# Patient Record
Sex: Male | Born: 1970 | Race: White | Hispanic: No | Marital: Married | Smoking: Former smoker
Health system: Southern US, Community
[De-identification: ages and names within clinical notes are randomized; demographics above are authoritative.]

## PROBLEM LIST (undated history)

## (undated) DIAGNOSIS — K219 Gastro-esophageal reflux disease without esophagitis: Secondary | ICD-10-CM

## (undated) DIAGNOSIS — N200 Calculus of kidney: Secondary | ICD-10-CM

## (undated) HISTORY — DX: Gastro-esophageal reflux disease without esophagitis: K21.9

## (undated) HISTORY — DX: Calculus of kidney: N20.0

## (undated) MED FILL — Bevacizumab-adcd IV Soln 100 MG/4ML (For Infusion): INTRAVENOUS | Qty: 24 | Status: AC

## (undated) MED FILL — Irinotecan HCl Inj 40 MG/2ML (20 MG/ML): INTRAVENOUS | Qty: 23.1 | Status: AC

## (undated) MED FILL — Leucovorin Calcium For Inj 350 MG: INTRAMUSCULAR | Qty: 51 | Status: AC

---

## 2016-01-14 DIAGNOSIS — I10 Essential (primary) hypertension: Secondary | ICD-10-CM | POA: Diagnosis not present

## 2016-01-14 DIAGNOSIS — K219 Gastro-esophageal reflux disease without esophagitis: Secondary | ICD-10-CM | POA: Diagnosis not present

## 2016-01-14 DIAGNOSIS — K7689 Other specified diseases of liver: Secondary | ICD-10-CM | POA: Diagnosis not present

## 2016-01-14 DIAGNOSIS — R109 Unspecified abdominal pain: Secondary | ICD-10-CM | POA: Diagnosis not present

## 2016-01-14 DIAGNOSIS — Z79899 Other long term (current) drug therapy: Secondary | ICD-10-CM | POA: Diagnosis not present

## 2016-01-14 DIAGNOSIS — K5720 Diverticulitis of large intestine with perforation and abscess without bleeding: Secondary | ICD-10-CM | POA: Diagnosis not present

## 2016-01-14 DIAGNOSIS — K6389 Other specified diseases of intestine: Secondary | ICD-10-CM | POA: Diagnosis not present

## 2016-01-17 DIAGNOSIS — K7689 Other specified diseases of liver: Secondary | ICD-10-CM | POA: Diagnosis not present

## 2016-01-24 DIAGNOSIS — K5732 Diverticulitis of large intestine without perforation or abscess without bleeding: Secondary | ICD-10-CM | POA: Diagnosis not present

## 2016-02-02 DIAGNOSIS — D500 Iron deficiency anemia secondary to blood loss (chronic): Secondary | ICD-10-CM | POA: Insufficient documentation

## 2016-02-02 DIAGNOSIS — C189 Malignant neoplasm of colon, unspecified: Secondary | ICD-10-CM | POA: Insufficient documentation

## 2016-02-29 DIAGNOSIS — K5732 Diverticulitis of large intestine without perforation or abscess without bleeding: Secondary | ICD-10-CM | POA: Diagnosis not present

## 2016-03-08 HISTORY — PX: SIGMOIDECTOMY: SHX176

## 2016-03-16 DIAGNOSIS — K219 Gastro-esophageal reflux disease without esophagitis: Secondary | ICD-10-CM | POA: Diagnosis not present

## 2016-03-16 DIAGNOSIS — K5732 Diverticulitis of large intestine without perforation or abscess without bleeding: Secondary | ICD-10-CM | POA: Diagnosis not present

## 2016-03-16 DIAGNOSIS — D125 Benign neoplasm of sigmoid colon: Secondary | ICD-10-CM | POA: Diagnosis not present

## 2016-03-16 DIAGNOSIS — C187 Malignant neoplasm of sigmoid colon: Secondary | ICD-10-CM | POA: Diagnosis not present

## 2016-03-20 DIAGNOSIS — K6389 Other specified diseases of intestine: Secondary | ICD-10-CM | POA: Diagnosis not present

## 2016-03-20 DIAGNOSIS — K5730 Diverticulosis of large intestine without perforation or abscess without bleeding: Secondary | ICD-10-CM | POA: Diagnosis not present

## 2016-03-21 DIAGNOSIS — K5732 Diverticulitis of large intestine without perforation or abscess without bleeding: Secondary | ICD-10-CM | POA: Diagnosis not present

## 2016-03-21 DIAGNOSIS — K639 Disease of intestine, unspecified: Secondary | ICD-10-CM | POA: Diagnosis not present

## 2016-04-05 ENCOUNTER — Encounter: Payer: Self-pay | Admitting: Oncology

## 2016-04-05 DIAGNOSIS — K219 Gastro-esophageal reflux disease without esophagitis: Secondary | ICD-10-CM | POA: Diagnosis not present

## 2016-04-05 DIAGNOSIS — K5720 Diverticulitis of large intestine with perforation and abscess without bleeding: Secondary | ICD-10-CM | POA: Diagnosis not present

## 2016-04-05 DIAGNOSIS — R339 Retention of urine, unspecified: Secondary | ICD-10-CM | POA: Diagnosis not present

## 2016-04-05 DIAGNOSIS — K639 Disease of intestine, unspecified: Secondary | ICD-10-CM | POA: Diagnosis not present

## 2016-04-05 DIAGNOSIS — Z79899 Other long term (current) drug therapy: Secondary | ICD-10-CM | POA: Diagnosis not present

## 2016-04-05 DIAGNOSIS — C772 Secondary and unspecified malignant neoplasm of intra-abdominal lymph nodes: Secondary | ICD-10-CM | POA: Diagnosis not present

## 2016-04-05 DIAGNOSIS — C187 Malignant neoplasm of sigmoid colon: Secondary | ICD-10-CM | POA: Diagnosis not present

## 2016-04-05 DIAGNOSIS — K5732 Diverticulitis of large intestine without perforation or abscess without bleeding: Secondary | ICD-10-CM | POA: Diagnosis not present

## 2016-04-05 DIAGNOSIS — Z87891 Personal history of nicotine dependence: Secondary | ICD-10-CM | POA: Diagnosis not present

## 2016-05-04 DIAGNOSIS — D649 Anemia, unspecified: Secondary | ICD-10-CM | POA: Diagnosis not present

## 2016-05-04 DIAGNOSIS — C187 Malignant neoplasm of sigmoid colon: Secondary | ICD-10-CM | POA: Diagnosis not present

## 2016-05-09 DIAGNOSIS — K7689 Other specified diseases of liver: Secondary | ICD-10-CM | POA: Diagnosis not present

## 2016-05-09 DIAGNOSIS — C187 Malignant neoplasm of sigmoid colon: Secondary | ICD-10-CM | POA: Diagnosis not present

## 2016-05-10 DIAGNOSIS — Z452 Encounter for adjustment and management of vascular access device: Secondary | ICD-10-CM | POA: Diagnosis not present

## 2016-05-10 DIAGNOSIS — I10 Essential (primary) hypertension: Secondary | ICD-10-CM | POA: Diagnosis not present

## 2016-05-10 DIAGNOSIS — Z79899 Other long term (current) drug therapy: Secondary | ICD-10-CM | POA: Diagnosis not present

## 2016-05-10 DIAGNOSIS — C187 Malignant neoplasm of sigmoid colon: Secondary | ICD-10-CM | POA: Diagnosis not present

## 2016-05-10 DIAGNOSIS — K219 Gastro-esophageal reflux disease without esophagitis: Secondary | ICD-10-CM | POA: Diagnosis not present

## 2016-05-12 DIAGNOSIS — C187 Malignant neoplasm of sigmoid colon: Secondary | ICD-10-CM | POA: Diagnosis not present

## 2016-05-17 DIAGNOSIS — C187 Malignant neoplasm of sigmoid colon: Secondary | ICD-10-CM | POA: Diagnosis not present

## 2016-05-17 DIAGNOSIS — Z5111 Encounter for antineoplastic chemotherapy: Secondary | ICD-10-CM | POA: Diagnosis not present

## 2016-05-19 DIAGNOSIS — Z452 Encounter for adjustment and management of vascular access device: Secondary | ICD-10-CM | POA: Diagnosis not present

## 2016-05-19 DIAGNOSIS — C187 Malignant neoplasm of sigmoid colon: Secondary | ICD-10-CM | POA: Diagnosis not present

## 2016-05-26 DIAGNOSIS — C187 Malignant neoplasm of sigmoid colon: Secondary | ICD-10-CM | POA: Diagnosis not present

## 2016-05-31 DIAGNOSIS — C187 Malignant neoplasm of sigmoid colon: Secondary | ICD-10-CM | POA: Diagnosis not present

## 2016-06-02 DIAGNOSIS — C187 Malignant neoplasm of sigmoid colon: Secondary | ICD-10-CM | POA: Diagnosis not present

## 2016-06-02 DIAGNOSIS — Z452 Encounter for adjustment and management of vascular access device: Secondary | ICD-10-CM | POA: Diagnosis not present

## 2016-06-14 DIAGNOSIS — C187 Malignant neoplasm of sigmoid colon: Secondary | ICD-10-CM | POA: Diagnosis not present

## 2016-06-16 DIAGNOSIS — C187 Malignant neoplasm of sigmoid colon: Secondary | ICD-10-CM | POA: Diagnosis not present

## 2016-06-16 DIAGNOSIS — Z452 Encounter for adjustment and management of vascular access device: Secondary | ICD-10-CM | POA: Diagnosis not present

## 2016-06-17 DIAGNOSIS — C187 Malignant neoplasm of sigmoid colon: Secondary | ICD-10-CM | POA: Diagnosis not present

## 2016-06-28 DIAGNOSIS — C187 Malignant neoplasm of sigmoid colon: Secondary | ICD-10-CM | POA: Diagnosis not present

## 2016-06-30 DIAGNOSIS — Z452 Encounter for adjustment and management of vascular access device: Secondary | ICD-10-CM | POA: Diagnosis not present

## 2016-06-30 DIAGNOSIS — C187 Malignant neoplasm of sigmoid colon: Secondary | ICD-10-CM | POA: Diagnosis not present

## 2016-07-12 DIAGNOSIS — Z800 Family history of malignant neoplasm of digestive organs: Secondary | ICD-10-CM | POA: Diagnosis not present

## 2016-07-12 DIAGNOSIS — Z808 Family history of malignant neoplasm of other organs or systems: Secondary | ICD-10-CM | POA: Diagnosis not present

## 2016-07-12 DIAGNOSIS — Z806 Family history of leukemia: Secondary | ICD-10-CM | POA: Diagnosis not present

## 2016-07-12 DIAGNOSIS — C187 Malignant neoplasm of sigmoid colon: Secondary | ICD-10-CM | POA: Diagnosis not present

## 2016-07-12 DIAGNOSIS — C189 Malignant neoplasm of colon, unspecified: Secondary | ICD-10-CM | POA: Diagnosis not present

## 2016-07-14 DIAGNOSIS — C187 Malignant neoplasm of sigmoid colon: Secondary | ICD-10-CM | POA: Diagnosis not present

## 2016-07-14 DIAGNOSIS — Z452 Encounter for adjustment and management of vascular access device: Secondary | ICD-10-CM | POA: Diagnosis not present

## 2016-07-15 DIAGNOSIS — C187 Malignant neoplasm of sigmoid colon: Secondary | ICD-10-CM | POA: Diagnosis not present

## 2016-07-26 DIAGNOSIS — K5900 Constipation, unspecified: Secondary | ICD-10-CM | POA: Diagnosis not present

## 2016-07-26 DIAGNOSIS — C187 Malignant neoplasm of sigmoid colon: Secondary | ICD-10-CM | POA: Diagnosis not present

## 2016-07-26 DIAGNOSIS — D509 Iron deficiency anemia, unspecified: Secondary | ICD-10-CM | POA: Diagnosis not present

## 2016-07-28 DIAGNOSIS — Z452 Encounter for adjustment and management of vascular access device: Secondary | ICD-10-CM | POA: Diagnosis not present

## 2016-07-28 DIAGNOSIS — C187 Malignant neoplasm of sigmoid colon: Secondary | ICD-10-CM | POA: Diagnosis not present

## 2016-08-09 DIAGNOSIS — Z800 Family history of malignant neoplasm of digestive organs: Secondary | ICD-10-CM | POA: Diagnosis not present

## 2016-08-09 DIAGNOSIS — K5900 Constipation, unspecified: Secondary | ICD-10-CM | POA: Diagnosis not present

## 2016-08-09 DIAGNOSIS — K769 Liver disease, unspecified: Secondary | ICD-10-CM | POA: Diagnosis not present

## 2016-08-09 DIAGNOSIS — D509 Iron deficiency anemia, unspecified: Secondary | ICD-10-CM | POA: Diagnosis not present

## 2016-08-09 DIAGNOSIS — C779 Secondary and unspecified malignant neoplasm of lymph node, unspecified: Secondary | ICD-10-CM | POA: Diagnosis not present

## 2016-08-09 DIAGNOSIS — Z806 Family history of leukemia: Secondary | ICD-10-CM | POA: Diagnosis not present

## 2016-08-09 DIAGNOSIS — C187 Malignant neoplasm of sigmoid colon: Secondary | ICD-10-CM | POA: Diagnosis not present

## 2016-08-09 DIAGNOSIS — Z801 Family history of malignant neoplasm of trachea, bronchus and lung: Secondary | ICD-10-CM | POA: Diagnosis not present

## 2016-08-11 DIAGNOSIS — Z452 Encounter for adjustment and management of vascular access device: Secondary | ICD-10-CM | POA: Diagnosis not present

## 2016-08-11 DIAGNOSIS — C187 Malignant neoplasm of sigmoid colon: Secondary | ICD-10-CM | POA: Diagnosis not present

## 2016-08-15 DIAGNOSIS — C187 Malignant neoplasm of sigmoid colon: Secondary | ICD-10-CM | POA: Diagnosis not present

## 2016-08-23 DIAGNOSIS — C187 Malignant neoplasm of sigmoid colon: Secondary | ICD-10-CM | POA: Diagnosis not present

## 2016-08-25 DIAGNOSIS — Z452 Encounter for adjustment and management of vascular access device: Secondary | ICD-10-CM | POA: Diagnosis not present

## 2016-08-25 DIAGNOSIS — D509 Iron deficiency anemia, unspecified: Secondary | ICD-10-CM | POA: Diagnosis not present

## 2016-08-25 DIAGNOSIS — C187 Malignant neoplasm of sigmoid colon: Secondary | ICD-10-CM | POA: Diagnosis not present

## 2016-09-06 DIAGNOSIS — Z5111 Encounter for antineoplastic chemotherapy: Secondary | ICD-10-CM | POA: Diagnosis not present

## 2016-09-06 DIAGNOSIS — C187 Malignant neoplasm of sigmoid colon: Secondary | ICD-10-CM | POA: Diagnosis not present

## 2016-09-08 DIAGNOSIS — C187 Malignant neoplasm of sigmoid colon: Secondary | ICD-10-CM | POA: Diagnosis not present

## 2016-09-08 DIAGNOSIS — Z452 Encounter for adjustment and management of vascular access device: Secondary | ICD-10-CM | POA: Diagnosis not present

## 2016-09-14 DIAGNOSIS — C187 Malignant neoplasm of sigmoid colon: Secondary | ICD-10-CM | POA: Diagnosis not present

## 2016-09-20 DIAGNOSIS — D649 Anemia, unspecified: Secondary | ICD-10-CM | POA: Diagnosis not present

## 2016-09-20 DIAGNOSIS — C187 Malignant neoplasm of sigmoid colon: Secondary | ICD-10-CM | POA: Diagnosis not present

## 2016-09-22 DIAGNOSIS — C187 Malignant neoplasm of sigmoid colon: Secondary | ICD-10-CM | POA: Diagnosis not present

## 2016-09-22 DIAGNOSIS — Z452 Encounter for adjustment and management of vascular access device: Secondary | ICD-10-CM | POA: Diagnosis not present

## 2016-10-04 DIAGNOSIS — C187 Malignant neoplasm of sigmoid colon: Secondary | ICD-10-CM | POA: Diagnosis not present

## 2016-10-06 DIAGNOSIS — C187 Malignant neoplasm of sigmoid colon: Secondary | ICD-10-CM | POA: Diagnosis not present

## 2016-10-06 DIAGNOSIS — Z452 Encounter for adjustment and management of vascular access device: Secondary | ICD-10-CM | POA: Diagnosis not present

## 2016-10-15 DIAGNOSIS — C187 Malignant neoplasm of sigmoid colon: Secondary | ICD-10-CM | POA: Diagnosis not present

## 2016-10-18 DIAGNOSIS — Z85038 Personal history of other malignant neoplasm of large intestine: Secondary | ICD-10-CM | POA: Diagnosis not present

## 2016-10-18 DIAGNOSIS — Z5111 Encounter for antineoplastic chemotherapy: Secondary | ICD-10-CM | POA: Diagnosis not present

## 2016-10-18 DIAGNOSIS — C187 Malignant neoplasm of sigmoid colon: Secondary | ICD-10-CM | POA: Diagnosis not present

## 2016-10-18 DIAGNOSIS — Z9221 Personal history of antineoplastic chemotherapy: Secondary | ICD-10-CM | POA: Diagnosis not present

## 2016-10-20 DIAGNOSIS — C187 Malignant neoplasm of sigmoid colon: Secondary | ICD-10-CM | POA: Diagnosis not present

## 2016-10-20 DIAGNOSIS — Z452 Encounter for adjustment and management of vascular access device: Secondary | ICD-10-CM | POA: Diagnosis not present

## 2017-01-16 DIAGNOSIS — K7689 Other specified diseases of liver: Secondary | ICD-10-CM | POA: Diagnosis not present

## 2017-01-16 DIAGNOSIS — D500 Iron deficiency anemia secondary to blood loss (chronic): Secondary | ICD-10-CM | POA: Diagnosis not present

## 2017-01-16 DIAGNOSIS — C187 Malignant neoplasm of sigmoid colon: Secondary | ICD-10-CM | POA: Diagnosis not present

## 2017-01-16 DIAGNOSIS — D649 Anemia, unspecified: Secondary | ICD-10-CM | POA: Diagnosis not present

## 2017-01-17 DIAGNOSIS — R935 Abnormal findings on diagnostic imaging of other abdominal regions, including retroperitoneum: Secondary | ICD-10-CM | POA: Diagnosis not present

## 2017-01-17 DIAGNOSIS — R188 Other ascites: Secondary | ICD-10-CM | POA: Diagnosis not present

## 2017-01-17 DIAGNOSIS — Z9221 Personal history of antineoplastic chemotherapy: Secondary | ICD-10-CM | POA: Diagnosis not present

## 2017-01-17 DIAGNOSIS — C187 Malignant neoplasm of sigmoid colon: Secondary | ICD-10-CM | POA: Diagnosis not present

## 2017-01-17 DIAGNOSIS — Z85038 Personal history of other malignant neoplasm of large intestine: Secondary | ICD-10-CM | POA: Diagnosis not present

## 2017-01-22 DIAGNOSIS — C786 Secondary malignant neoplasm of retroperitoneum and peritoneum: Secondary | ICD-10-CM | POA: Diagnosis not present

## 2017-01-22 DIAGNOSIS — R188 Other ascites: Secondary | ICD-10-CM | POA: Diagnosis not present

## 2017-01-22 DIAGNOSIS — C187 Malignant neoplasm of sigmoid colon: Secondary | ICD-10-CM | POA: Diagnosis not present

## 2017-01-23 DIAGNOSIS — R188 Other ascites: Secondary | ICD-10-CM | POA: Diagnosis not present

## 2017-01-23 DIAGNOSIS — K7689 Other specified diseases of liver: Secondary | ICD-10-CM | POA: Diagnosis not present

## 2017-01-23 DIAGNOSIS — C187 Malignant neoplasm of sigmoid colon: Secondary | ICD-10-CM | POA: Diagnosis not present

## 2017-01-23 DIAGNOSIS — K769 Liver disease, unspecified: Secondary | ICD-10-CM | POA: Diagnosis not present

## 2017-02-02 DIAGNOSIS — K668 Other specified disorders of peritoneum: Secondary | ICD-10-CM | POA: Diagnosis not present

## 2017-02-02 DIAGNOSIS — D649 Anemia, unspecified: Secondary | ICD-10-CM | POA: Diagnosis not present

## 2017-02-02 DIAGNOSIS — C187 Malignant neoplasm of sigmoid colon: Secondary | ICD-10-CM | POA: Diagnosis not present

## 2017-02-02 DIAGNOSIS — D500 Iron deficiency anemia secondary to blood loss (chronic): Secondary | ICD-10-CM | POA: Diagnosis not present

## 2017-02-02 DIAGNOSIS — K658 Other peritonitis: Secondary | ICD-10-CM | POA: Diagnosis not present

## 2017-02-07 DIAGNOSIS — C20 Malignant neoplasm of rectum: Secondary | ICD-10-CM | POA: Diagnosis not present

## 2017-02-07 DIAGNOSIS — R180 Malignant ascites: Secondary | ICD-10-CM | POA: Diagnosis not present

## 2017-02-07 DIAGNOSIS — C189 Malignant neoplasm of colon, unspecified: Secondary | ICD-10-CM | POA: Diagnosis not present

## 2017-02-07 DIAGNOSIS — Z9221 Personal history of antineoplastic chemotherapy: Secondary | ICD-10-CM | POA: Diagnosis not present

## 2017-02-07 DIAGNOSIS — C187 Malignant neoplasm of sigmoid colon: Secondary | ICD-10-CM | POA: Diagnosis not present

## 2017-02-07 DIAGNOSIS — R935 Abnormal findings on diagnostic imaging of other abdominal regions, including retroperitoneum: Secondary | ICD-10-CM | POA: Diagnosis not present

## 2017-02-07 DIAGNOSIS — Z85038 Personal history of other malignant neoplasm of large intestine: Secondary | ICD-10-CM | POA: Diagnosis not present

## 2017-02-13 DIAGNOSIS — C187 Malignant neoplasm of sigmoid colon: Secondary | ICD-10-CM | POA: Diagnosis not present

## 2017-02-13 DIAGNOSIS — R188 Other ascites: Secondary | ICD-10-CM | POA: Diagnosis not present

## 2017-02-15 DIAGNOSIS — C187 Malignant neoplasm of sigmoid colon: Secondary | ICD-10-CM | POA: Diagnosis not present

## 2017-02-15 DIAGNOSIS — Z79899 Other long term (current) drug therapy: Secondary | ICD-10-CM | POA: Diagnosis not present

## 2017-02-15 DIAGNOSIS — Z9049 Acquired absence of other specified parts of digestive tract: Secondary | ICD-10-CM | POA: Diagnosis not present

## 2017-02-15 DIAGNOSIS — K219 Gastro-esophageal reflux disease without esophagitis: Secondary | ICD-10-CM | POA: Diagnosis not present

## 2017-02-21 DIAGNOSIS — R188 Other ascites: Secondary | ICD-10-CM | POA: Diagnosis not present

## 2017-02-21 DIAGNOSIS — Z79891 Long term (current) use of opiate analgesic: Secondary | ICD-10-CM | POA: Diagnosis not present

## 2017-02-21 DIAGNOSIS — K654 Sclerosing mesenteritis: Secondary | ICD-10-CM | POA: Diagnosis not present

## 2017-02-21 DIAGNOSIS — I10 Essential (primary) hypertension: Secondary | ICD-10-CM | POA: Diagnosis not present

## 2017-02-21 DIAGNOSIS — Z79899 Other long term (current) drug therapy: Secondary | ICD-10-CM | POA: Diagnosis not present

## 2017-02-21 DIAGNOSIS — K6389 Other specified diseases of intestine: Secondary | ICD-10-CM | POA: Diagnosis not present

## 2017-02-21 DIAGNOSIS — Z85038 Personal history of other malignant neoplasm of large intestine: Secondary | ICD-10-CM | POA: Diagnosis not present

## 2017-03-14 DIAGNOSIS — K810 Acute cholecystitis: Secondary | ICD-10-CM | POA: Diagnosis not present

## 2017-03-14 DIAGNOSIS — Z85038 Personal history of other malignant neoplasm of large intestine: Secondary | ICD-10-CM | POA: Diagnosis not present

## 2017-03-14 DIAGNOSIS — K8000 Calculus of gallbladder with acute cholecystitis without obstruction: Secondary | ICD-10-CM | POA: Diagnosis not present

## 2017-03-14 DIAGNOSIS — K8020 Calculus of gallbladder without cholecystitis without obstruction: Secondary | ICD-10-CM | POA: Diagnosis not present

## 2017-03-14 DIAGNOSIS — Z79899 Other long term (current) drug therapy: Secondary | ICD-10-CM | POA: Diagnosis not present

## 2017-03-14 DIAGNOSIS — C19 Malignant neoplasm of rectosigmoid junction: Secondary | ICD-10-CM | POA: Diagnosis not present

## 2017-03-14 DIAGNOSIS — R109 Unspecified abdominal pain: Secondary | ICD-10-CM | POA: Diagnosis not present

## 2017-03-14 DIAGNOSIS — K219 Gastro-esophageal reflux disease without esophagitis: Secondary | ICD-10-CM | POA: Diagnosis not present

## 2017-03-14 DIAGNOSIS — I10 Essential (primary) hypertension: Secondary | ICD-10-CM | POA: Diagnosis not present

## 2017-03-15 DIAGNOSIS — K810 Acute cholecystitis: Secondary | ICD-10-CM | POA: Diagnosis not present

## 2017-03-15 DIAGNOSIS — K8000 Calculus of gallbladder with acute cholecystitis without obstruction: Secondary | ICD-10-CM | POA: Diagnosis not present

## 2017-03-15 DIAGNOSIS — K8020 Calculus of gallbladder without cholecystitis without obstruction: Secondary | ICD-10-CM | POA: Diagnosis not present

## 2017-03-27 DIAGNOSIS — Z23 Encounter for immunization: Secondary | ICD-10-CM | POA: Diagnosis not present

## 2017-03-27 DIAGNOSIS — Z1339 Encounter for screening examination for other mental health and behavioral disorders: Secondary | ICD-10-CM | POA: Diagnosis not present

## 2017-03-27 DIAGNOSIS — K219 Gastro-esophageal reflux disease without esophagitis: Secondary | ICD-10-CM | POA: Diagnosis not present

## 2017-03-27 DIAGNOSIS — Z1331 Encounter for screening for depression: Secondary | ICD-10-CM | POA: Diagnosis not present

## 2017-03-27 DIAGNOSIS — Z6826 Body mass index (BMI) 26.0-26.9, adult: Secondary | ICD-10-CM | POA: Diagnosis not present

## 2017-04-27 DIAGNOSIS — R188 Other ascites: Secondary | ICD-10-CM | POA: Diagnosis not present

## 2017-04-27 DIAGNOSIS — C187 Malignant neoplasm of sigmoid colon: Secondary | ICD-10-CM | POA: Diagnosis not present

## 2017-05-03 DIAGNOSIS — L709 Acne, unspecified: Secondary | ICD-10-CM | POA: Diagnosis not present

## 2017-05-03 DIAGNOSIS — K219 Gastro-esophageal reflux disease without esophagitis: Secondary | ICD-10-CM | POA: Diagnosis not present

## 2017-05-03 DIAGNOSIS — Z6827 Body mass index (BMI) 27.0-27.9, adult: Secondary | ICD-10-CM | POA: Diagnosis not present

## 2017-05-03 DIAGNOSIS — R030 Elevated blood-pressure reading, without diagnosis of hypertension: Secondary | ICD-10-CM | POA: Diagnosis not present

## 2017-05-25 DIAGNOSIS — Z85038 Personal history of other malignant neoplasm of large intestine: Secondary | ICD-10-CM | POA: Diagnosis not present

## 2017-05-25 DIAGNOSIS — K769 Liver disease, unspecified: Secondary | ICD-10-CM | POA: Diagnosis not present

## 2017-08-02 DIAGNOSIS — Z6828 Body mass index (BMI) 28.0-28.9, adult: Secondary | ICD-10-CM | POA: Diagnosis not present

## 2017-08-02 DIAGNOSIS — J111 Influenza due to unidentified influenza virus with other respiratory manifestations: Secondary | ICD-10-CM | POA: Diagnosis not present

## 2017-08-20 DIAGNOSIS — Z6827 Body mass index (BMI) 27.0-27.9, adult: Secondary | ICD-10-CM | POA: Diagnosis not present

## 2017-08-20 DIAGNOSIS — K5900 Constipation, unspecified: Secondary | ICD-10-CM | POA: Diagnosis not present

## 2017-08-20 DIAGNOSIS — R55 Syncope and collapse: Secondary | ICD-10-CM | POA: Diagnosis not present

## 2017-08-20 DIAGNOSIS — R3129 Other microscopic hematuria: Secondary | ICD-10-CM | POA: Diagnosis not present

## 2017-08-23 DIAGNOSIS — C187 Malignant neoplasm of sigmoid colon: Secondary | ICD-10-CM | POA: Diagnosis not present

## 2017-08-23 DIAGNOSIS — R945 Abnormal results of liver function studies: Secondary | ICD-10-CM | POA: Diagnosis not present

## 2017-08-23 DIAGNOSIS — D649 Anemia, unspecified: Secondary | ICD-10-CM | POA: Diagnosis not present

## 2017-08-23 DIAGNOSIS — K769 Liver disease, unspecified: Secondary | ICD-10-CM | POA: Diagnosis not present

## 2017-08-23 DIAGNOSIS — Z85038 Personal history of other malignant neoplasm of large intestine: Secondary | ICD-10-CM | POA: Diagnosis not present

## 2017-08-23 DIAGNOSIS — Z9221 Personal history of antineoplastic chemotherapy: Secondary | ICD-10-CM | POA: Diagnosis not present

## 2017-08-23 DIAGNOSIS — R748 Abnormal levels of other serum enzymes: Secondary | ICD-10-CM | POA: Diagnosis not present

## 2017-08-23 DIAGNOSIS — Z79899 Other long term (current) drug therapy: Secondary | ICD-10-CM | POA: Diagnosis not present

## 2017-08-31 DIAGNOSIS — K769 Liver disease, unspecified: Secondary | ICD-10-CM | POA: Diagnosis not present

## 2017-08-31 DIAGNOSIS — Z85038 Personal history of other malignant neoplasm of large intestine: Secondary | ICD-10-CM | POA: Diagnosis not present

## 2017-09-21 DIAGNOSIS — C187 Malignant neoplasm of sigmoid colon: Secondary | ICD-10-CM | POA: Diagnosis not present

## 2017-11-02 DIAGNOSIS — C187 Malignant neoplasm of sigmoid colon: Secondary | ICD-10-CM | POA: Diagnosis not present

## 2017-11-02 DIAGNOSIS — N281 Cyst of kidney, acquired: Secondary | ICD-10-CM | POA: Diagnosis not present

## 2017-11-06 DIAGNOSIS — Z9221 Personal history of antineoplastic chemotherapy: Secondary | ICD-10-CM | POA: Diagnosis not present

## 2017-11-06 DIAGNOSIS — Z85038 Personal history of other malignant neoplasm of large intestine: Secondary | ICD-10-CM | POA: Diagnosis not present

## 2018-03-14 DIAGNOSIS — K769 Liver disease, unspecified: Secondary | ICD-10-CM | POA: Diagnosis not present

## 2018-03-14 DIAGNOSIS — Z85038 Personal history of other malignant neoplasm of large intestine: Secondary | ICD-10-CM | POA: Diagnosis not present

## 2018-03-25 DIAGNOSIS — Z452 Encounter for adjustment and management of vascular access device: Secondary | ICD-10-CM | POA: Diagnosis not present

## 2018-04-03 DIAGNOSIS — Z85038 Personal history of other malignant neoplasm of large intestine: Secondary | ICD-10-CM | POA: Diagnosis not present

## 2018-04-03 DIAGNOSIS — Z452 Encounter for adjustment and management of vascular access device: Secondary | ICD-10-CM | POA: Diagnosis not present

## 2018-04-03 DIAGNOSIS — K219 Gastro-esophageal reflux disease without esophagitis: Secondary | ICD-10-CM | POA: Diagnosis not present

## 2018-04-03 DIAGNOSIS — Z79899 Other long term (current) drug therapy: Secondary | ICD-10-CM | POA: Diagnosis not present

## 2018-04-03 DIAGNOSIS — Z980 Intestinal bypass and anastomosis status: Secondary | ICD-10-CM | POA: Diagnosis not present

## 2018-04-03 DIAGNOSIS — Z87891 Personal history of nicotine dependence: Secondary | ICD-10-CM | POA: Diagnosis not present

## 2018-05-03 DIAGNOSIS — C187 Malignant neoplasm of sigmoid colon: Secondary | ICD-10-CM | POA: Diagnosis not present

## 2018-05-03 DIAGNOSIS — N2889 Other specified disorders of kidney and ureter: Secondary | ICD-10-CM | POA: Diagnosis not present

## 2019-02-20 DIAGNOSIS — Z85038 Personal history of other malignant neoplasm of large intestine: Secondary | ICD-10-CM | POA: Diagnosis not present

## 2019-02-20 DIAGNOSIS — R180 Malignant ascites: Secondary | ICD-10-CM | POA: Diagnosis not present

## 2019-02-20 DIAGNOSIS — D500 Iron deficiency anemia secondary to blood loss (chronic): Secondary | ICD-10-CM | POA: Diagnosis not present

## 2019-02-20 DIAGNOSIS — C187 Malignant neoplasm of sigmoid colon: Secondary | ICD-10-CM | POA: Diagnosis not present

## 2019-02-20 DIAGNOSIS — K769 Liver disease, unspecified: Secondary | ICD-10-CM | POA: Diagnosis not present

## 2019-02-21 DIAGNOSIS — Z9221 Personal history of antineoplastic chemotherapy: Secondary | ICD-10-CM | POA: Diagnosis not present

## 2019-02-21 DIAGNOSIS — C187 Malignant neoplasm of sigmoid colon: Secondary | ICD-10-CM | POA: Diagnosis not present

## 2019-02-21 DIAGNOSIS — Z85038 Personal history of other malignant neoplasm of large intestine: Secondary | ICD-10-CM | POA: Diagnosis not present

## 2019-02-21 DIAGNOSIS — R180 Malignant ascites: Secondary | ICD-10-CM | POA: Diagnosis not present

## 2019-05-19 DIAGNOSIS — C187 Malignant neoplasm of sigmoid colon: Secondary | ICD-10-CM | POA: Diagnosis not present

## 2019-05-19 DIAGNOSIS — D649 Anemia, unspecified: Secondary | ICD-10-CM | POA: Diagnosis not present

## 2019-05-19 DIAGNOSIS — K7689 Other specified diseases of liver: Secondary | ICD-10-CM | POA: Diagnosis not present

## 2019-05-29 DIAGNOSIS — C787 Secondary malignant neoplasm of liver and intrahepatic bile duct: Secondary | ICD-10-CM | POA: Insufficient documentation

## 2019-05-29 DIAGNOSIS — R180 Malignant ascites: Secondary | ICD-10-CM | POA: Diagnosis not present

## 2019-05-29 DIAGNOSIS — D649 Anemia, unspecified: Secondary | ICD-10-CM | POA: Diagnosis not present

## 2019-05-29 DIAGNOSIS — C187 Malignant neoplasm of sigmoid colon: Secondary | ICD-10-CM

## 2019-05-29 DIAGNOSIS — D500 Iron deficiency anemia secondary to blood loss (chronic): Secondary | ICD-10-CM | POA: Diagnosis not present

## 2019-05-30 DIAGNOSIS — R933 Abnormal findings on diagnostic imaging of other parts of digestive tract: Secondary | ICD-10-CM | POA: Diagnosis not present

## 2019-05-30 DIAGNOSIS — C187 Malignant neoplasm of sigmoid colon: Secondary | ICD-10-CM | POA: Diagnosis not present

## 2019-05-30 DIAGNOSIS — C189 Malignant neoplasm of colon, unspecified: Secondary | ICD-10-CM | POA: Diagnosis not present

## 2019-06-04 DIAGNOSIS — Z20828 Contact with and (suspected) exposure to other viral communicable diseases: Secondary | ICD-10-CM | POA: Diagnosis not present

## 2019-06-04 DIAGNOSIS — Z01818 Encounter for other preprocedural examination: Secondary | ICD-10-CM | POA: Diagnosis not present

## 2019-06-10 DIAGNOSIS — R935 Abnormal findings on diagnostic imaging of other abdominal regions, including retroperitoneum: Secondary | ICD-10-CM | POA: Diagnosis not present

## 2019-06-10 DIAGNOSIS — C229 Malignant neoplasm of liver, not specified as primary or secondary: Secondary | ICD-10-CM | POA: Diagnosis not present

## 2019-06-10 DIAGNOSIS — K7689 Other specified diseases of liver: Secondary | ICD-10-CM | POA: Diagnosis not present

## 2019-06-10 DIAGNOSIS — C227 Other specified carcinomas of liver: Secondary | ICD-10-CM | POA: Diagnosis not present

## 2019-06-10 DIAGNOSIS — K7290 Hepatic failure, unspecified without coma: Secondary | ICD-10-CM | POA: Diagnosis not present

## 2019-06-10 DIAGNOSIS — C187 Malignant neoplasm of sigmoid colon: Secondary | ICD-10-CM | POA: Diagnosis not present

## 2019-06-11 DIAGNOSIS — K769 Liver disease, unspecified: Secondary | ICD-10-CM | POA: Diagnosis not present

## 2019-06-11 DIAGNOSIS — Z85038 Personal history of other malignant neoplasm of large intestine: Secondary | ICD-10-CM | POA: Diagnosis not present

## 2019-06-11 DIAGNOSIS — C187 Malignant neoplasm of sigmoid colon: Secondary | ICD-10-CM | POA: Diagnosis not present

## 2019-06-16 DIAGNOSIS — Z9049 Acquired absence of other specified parts of digestive tract: Secondary | ICD-10-CM | POA: Diagnosis not present

## 2019-06-16 DIAGNOSIS — G629 Polyneuropathy, unspecified: Secondary | ICD-10-CM | POA: Diagnosis not present

## 2019-06-16 DIAGNOSIS — Z800 Family history of malignant neoplasm of digestive organs: Secondary | ICD-10-CM | POA: Diagnosis not present

## 2019-06-16 DIAGNOSIS — C187 Malignant neoplasm of sigmoid colon: Secondary | ICD-10-CM | POA: Diagnosis not present

## 2019-06-16 DIAGNOSIS — C787 Secondary malignant neoplasm of liver and intrahepatic bile duct: Secondary | ICD-10-CM | POA: Diagnosis not present

## 2019-06-16 DIAGNOSIS — R188 Other ascites: Secondary | ICD-10-CM | POA: Diagnosis not present

## 2019-06-16 DIAGNOSIS — K769 Liver disease, unspecified: Secondary | ICD-10-CM | POA: Diagnosis not present

## 2019-06-16 DIAGNOSIS — C801 Malignant (primary) neoplasm, unspecified: Secondary | ICD-10-CM | POA: Diagnosis not present

## 2019-06-16 DIAGNOSIS — C189 Malignant neoplasm of colon, unspecified: Secondary | ICD-10-CM | POA: Diagnosis not present

## 2019-06-16 DIAGNOSIS — C229 Malignant neoplasm of liver, not specified as primary or secondary: Secondary | ICD-10-CM | POA: Diagnosis not present

## 2019-06-30 DIAGNOSIS — Z01812 Encounter for preprocedural laboratory examination: Secondary | ICD-10-CM | POA: Diagnosis not present

## 2019-07-02 DIAGNOSIS — R42 Dizziness and giddiness: Secondary | ICD-10-CM | POA: Diagnosis not present

## 2019-07-02 DIAGNOSIS — Z9049 Acquired absence of other specified parts of digestive tract: Secondary | ICD-10-CM | POA: Diagnosis not present

## 2019-07-02 DIAGNOSIS — C187 Malignant neoplasm of sigmoid colon: Secondary | ICD-10-CM | POA: Diagnosis not present

## 2019-07-02 DIAGNOSIS — I10 Essential (primary) hypertension: Secondary | ICD-10-CM | POA: Diagnosis not present

## 2019-07-02 DIAGNOSIS — R109 Unspecified abdominal pain: Secondary | ICD-10-CM | POA: Diagnosis not present

## 2019-07-02 DIAGNOSIS — G8918 Other acute postprocedural pain: Secondary | ICD-10-CM | POA: Diagnosis not present

## 2019-07-02 DIAGNOSIS — C787 Secondary malignant neoplasm of liver and intrahepatic bile duct: Secondary | ICD-10-CM | POA: Diagnosis not present

## 2019-07-02 DIAGNOSIS — C189 Malignant neoplasm of colon, unspecified: Secondary | ICD-10-CM | POA: Diagnosis not present

## 2019-07-02 DIAGNOSIS — Z79899 Other long term (current) drug therapy: Secondary | ICD-10-CM | POA: Diagnosis not present

## 2019-07-02 DIAGNOSIS — Z800 Family history of malignant neoplasm of digestive organs: Secondary | ICD-10-CM | POA: Diagnosis not present

## 2019-07-02 HISTORY — PX: LIVER RESECTION: SHX1977

## 2019-07-04 DIAGNOSIS — R109 Unspecified abdominal pain: Secondary | ICD-10-CM | POA: Diagnosis not present

## 2019-07-04 DIAGNOSIS — G8918 Other acute postprocedural pain: Secondary | ICD-10-CM | POA: Diagnosis not present

## 2019-07-05 DIAGNOSIS — R109 Unspecified abdominal pain: Secondary | ICD-10-CM | POA: Diagnosis not present

## 2019-07-05 DIAGNOSIS — C787 Secondary malignant neoplasm of liver and intrahepatic bile duct: Secondary | ICD-10-CM | POA: Diagnosis not present

## 2019-07-05 DIAGNOSIS — G8918 Other acute postprocedural pain: Secondary | ICD-10-CM | POA: Diagnosis not present

## 2019-07-17 DIAGNOSIS — C19 Malignant neoplasm of rectosigmoid junction: Secondary | ICD-10-CM | POA: Diagnosis not present

## 2019-07-17 DIAGNOSIS — K760 Fatty (change of) liver, not elsewhere classified: Secondary | ICD-10-CM | POA: Diagnosis not present

## 2019-07-17 DIAGNOSIS — C787 Secondary malignant neoplasm of liver and intrahepatic bile duct: Secondary | ICD-10-CM | POA: Diagnosis not present

## 2019-07-18 DIAGNOSIS — R180 Malignant ascites: Secondary | ICD-10-CM

## 2019-07-18 DIAGNOSIS — C187 Malignant neoplasm of sigmoid colon: Secondary | ICD-10-CM

## 2019-07-18 DIAGNOSIS — D649 Anemia, unspecified: Secondary | ICD-10-CM

## 2019-07-18 DIAGNOSIS — Z0001 Encounter for general adult medical examination with abnormal findings: Secondary | ICD-10-CM | POA: Diagnosis not present

## 2019-07-18 DIAGNOSIS — C787 Secondary malignant neoplasm of liver and intrahepatic bile duct: Secondary | ICD-10-CM | POA: Diagnosis not present

## 2019-07-31 DIAGNOSIS — C787 Secondary malignant neoplasm of liver and intrahepatic bile duct: Secondary | ICD-10-CM | POA: Diagnosis not present

## 2019-07-31 DIAGNOSIS — C187 Malignant neoplasm of sigmoid colon: Secondary | ICD-10-CM | POA: Diagnosis not present

## 2019-07-31 DIAGNOSIS — R180 Malignant ascites: Secondary | ICD-10-CM | POA: Diagnosis not present

## 2019-07-31 DIAGNOSIS — D649 Anemia, unspecified: Secondary | ICD-10-CM | POA: Diagnosis not present

## 2019-10-02 DIAGNOSIS — C187 Malignant neoplasm of sigmoid colon: Secondary | ICD-10-CM | POA: Diagnosis not present

## 2019-10-02 DIAGNOSIS — C189 Malignant neoplasm of colon, unspecified: Secondary | ICD-10-CM | POA: Diagnosis not present

## 2019-10-02 DIAGNOSIS — C787 Secondary malignant neoplasm of liver and intrahepatic bile duct: Secondary | ICD-10-CM | POA: Diagnosis not present

## 2019-10-02 DIAGNOSIS — D500 Iron deficiency anemia secondary to blood loss (chronic): Secondary | ICD-10-CM | POA: Diagnosis not present

## 2019-10-03 DIAGNOSIS — C187 Malignant neoplasm of sigmoid colon: Secondary | ICD-10-CM | POA: Diagnosis not present

## 2019-10-03 DIAGNOSIS — R180 Malignant ascites: Secondary | ICD-10-CM | POA: Diagnosis not present

## 2019-10-03 DIAGNOSIS — D500 Iron deficiency anemia secondary to blood loss (chronic): Secondary | ICD-10-CM | POA: Diagnosis not present

## 2020-01-01 DIAGNOSIS — C187 Malignant neoplasm of sigmoid colon: Secondary | ICD-10-CM | POA: Diagnosis not present

## 2020-01-01 DIAGNOSIS — Z9049 Acquired absence of other specified parts of digestive tract: Secondary | ICD-10-CM | POA: Diagnosis not present

## 2020-01-01 DIAGNOSIS — Z85048 Personal history of other malignant neoplasm of rectum, rectosigmoid junction, and anus: Secondary | ICD-10-CM | POA: Diagnosis not present

## 2020-01-01 DIAGNOSIS — Q630 Accessory kidney: Secondary | ICD-10-CM | POA: Diagnosis not present

## 2020-01-01 DIAGNOSIS — N281 Cyst of kidney, acquired: Secondary | ICD-10-CM | POA: Diagnosis not present

## 2020-01-02 DIAGNOSIS — C187 Malignant neoplasm of sigmoid colon: Secondary | ICD-10-CM | POA: Diagnosis not present

## 2020-01-02 DIAGNOSIS — C787 Secondary malignant neoplasm of liver and intrahepatic bile duct: Secondary | ICD-10-CM | POA: Diagnosis not present

## 2020-03-01 DIAGNOSIS — Z08 Encounter for follow-up examination after completed treatment for malignant neoplasm: Secondary | ICD-10-CM | POA: Diagnosis not present

## 2020-03-01 DIAGNOSIS — Z20828 Contact with and (suspected) exposure to other viral communicable diseases: Secondary | ICD-10-CM | POA: Diagnosis not present

## 2020-03-01 DIAGNOSIS — Z85038 Personal history of other malignant neoplasm of large intestine: Secondary | ICD-10-CM | POA: Diagnosis not present

## 2020-03-04 DIAGNOSIS — K635 Polyp of colon: Secondary | ICD-10-CM | POA: Diagnosis not present

## 2020-03-04 DIAGNOSIS — Z8505 Personal history of malignant neoplasm of liver: Secondary | ICD-10-CM | POA: Diagnosis not present

## 2020-03-04 DIAGNOSIS — Z79899 Other long term (current) drug therapy: Secondary | ICD-10-CM | POA: Diagnosis not present

## 2020-03-04 DIAGNOSIS — Z79891 Long term (current) use of opiate analgesic: Secondary | ICD-10-CM | POA: Diagnosis not present

## 2020-03-04 DIAGNOSIS — D122 Benign neoplasm of ascending colon: Secondary | ICD-10-CM | POA: Diagnosis not present

## 2020-03-04 DIAGNOSIS — Z8719 Personal history of other diseases of the digestive system: Secondary | ICD-10-CM | POA: Diagnosis not present

## 2020-03-04 DIAGNOSIS — Z85038 Personal history of other malignant neoplasm of large intestine: Secondary | ICD-10-CM | POA: Diagnosis not present

## 2020-03-04 DIAGNOSIS — K219 Gastro-esophageal reflux disease without esophagitis: Secondary | ICD-10-CM | POA: Diagnosis not present

## 2020-05-03 ENCOUNTER — Encounter: Payer: Self-pay | Admitting: Oncology

## 2020-05-03 ENCOUNTER — Other Ambulatory Visit: Payer: Self-pay | Admitting: Oncology

## 2020-05-03 ENCOUNTER — Telehealth: Payer: Self-pay | Admitting: Oncology

## 2020-05-03 DIAGNOSIS — C187 Malignant neoplasm of sigmoid colon: Secondary | ICD-10-CM

## 2020-05-03 DIAGNOSIS — C787 Secondary malignant neoplasm of liver and intrahepatic bile duct: Secondary | ICD-10-CM

## 2020-05-03 NOTE — Telephone Encounter (Signed)
Patient's spouse called regarding Labs, MRI Abdomen to be scheduled for Dec.  Gave Dr Gilman Buttner orders from [facility] to enter into [facility]

## 2020-05-24 ENCOUNTER — Ambulatory Visit (HOSPITAL_COMMUNITY): Payer: BC Managed Care – PPO

## 2020-05-25 ENCOUNTER — Ambulatory Visit (HOSPITAL_COMMUNITY)
Admission: RE | Admit: 2020-05-25 | Discharge: 2020-05-25 | Disposition: A | Discharge status: Home / Self Care | Payer: BC Managed Care – PPO | Source: Ambulatory Visit | Attending: Oncology | Admitting: Oncology

## 2020-05-25 ENCOUNTER — Other Ambulatory Visit: Payer: Self-pay

## 2020-05-25 ENCOUNTER — Telehealth: Payer: Self-pay

## 2020-05-25 DIAGNOSIS — C787 Secondary malignant neoplasm of liver and intrahepatic bile duct: Secondary | ICD-10-CM | POA: Diagnosis not present

## 2020-05-25 DIAGNOSIS — Z85038 Personal history of other malignant neoplasm of large intestine: Secondary | ICD-10-CM | POA: Diagnosis not present

## 2020-05-25 DIAGNOSIS — Z9049 Acquired absence of other specified parts of digestive tract: Secondary | ICD-10-CM | POA: Diagnosis not present

## 2020-05-25 DIAGNOSIS — Z8505 Personal history of malignant neoplasm of liver: Secondary | ICD-10-CM | POA: Diagnosis not present

## 2020-05-25 IMAGING — MR MR ABDOMEN WO/W CM
18 of 19 series · 46 of 48 positions shown · IV contrast (gadavist)
Comparison: Abdominal MRI [DATE].

CLINICAL DATA: 49-year-old male with history of gastrointestinal
cancer (colon cancer with liver metastases). Follow-up study.

EXAM:
MRI ABDOMEN WITHOUT AND WITH CONTRAST
TECHNIQUE: Multiplanar multisequence MR imaging of the abdomen was performed
both before and after the administration of intravenous contrast.
CONTRAST:  10mL GADAVIST GADOBUTROL 1 MMOL/ML IV SOLN

[Series 4: T2 · coronal · 6.0mm · 1.68mm/px · 2 of 42 slices shown (1 of 2)]
[im 1/42]
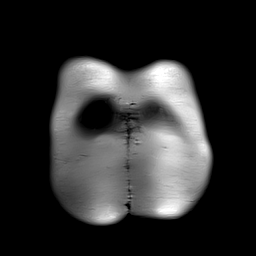
[im 42/42]
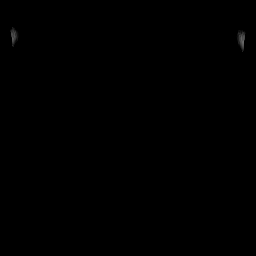

[Series 6: T2 fat-sat · axial · 6.0mm · 1.40mm/px · 1 of 40 slices shown]
[im 1/40]
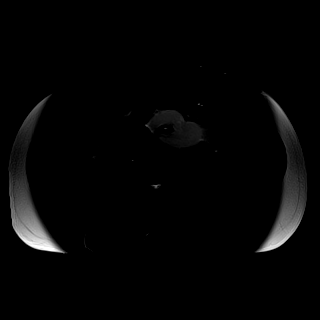

[Series 7: DWI · axial · 6.0mm · 1.68mm/px · z∈[-181,+129]mm · 3 of 88 slices shown (1 of 2)]
[im 1/88]
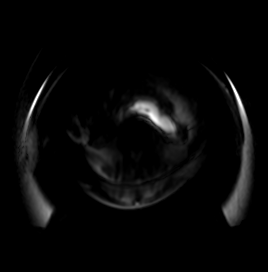
[im 44/88]
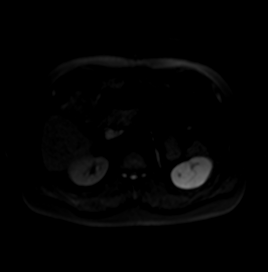
[im 88/88]
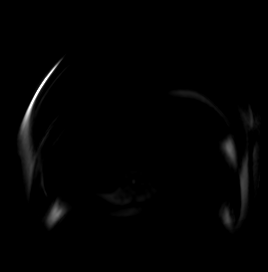

[Series 8: DWI · axial · 6.0mm · 1.68mm/px · z∈[-181,+129]mm · 2 of 44 slices shown (2 of 2)]
[im 1/44]
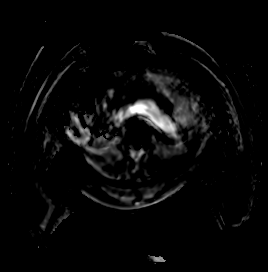
[im 44/44]
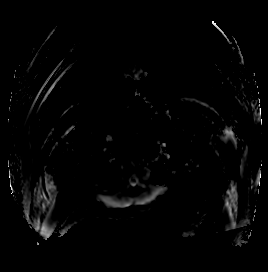

[Series 9: T1 · axial · 3.4mm · 1.41mm/px · z∈[-193,+76]mm · 3 of 80 slices shown (1 of 2)]
[im 1/80]
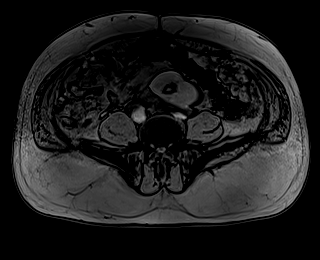
[im 40/80]
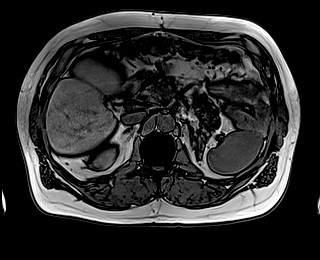
[im 80/80]
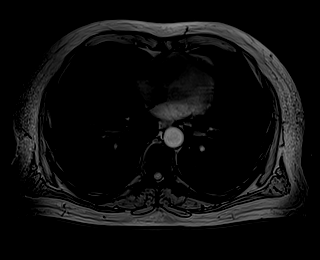

[Series 10: T1 · axial · 3.4mm · 1.41mm/px · z∈[-193,+76]mm · 3 of 80 slices shown (2 of 2)]
[im 1/80]
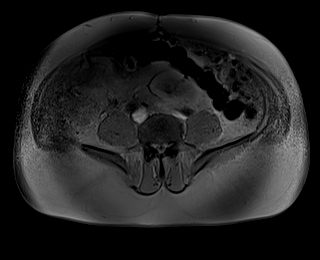
[im 40/80]
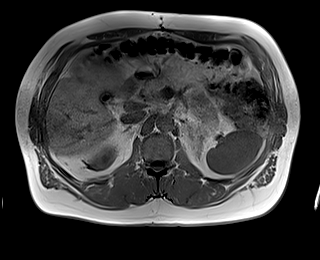
[im 80/80]
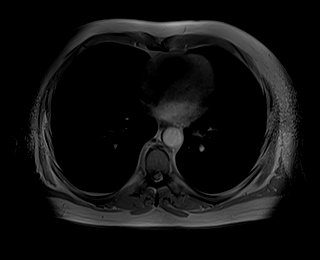

[Series 11: bSSFP · axial · 4.0mm · 0.88mm/px · z∈[-126,+126]mm · 2 of 64 slices shown]
[im 1/64]
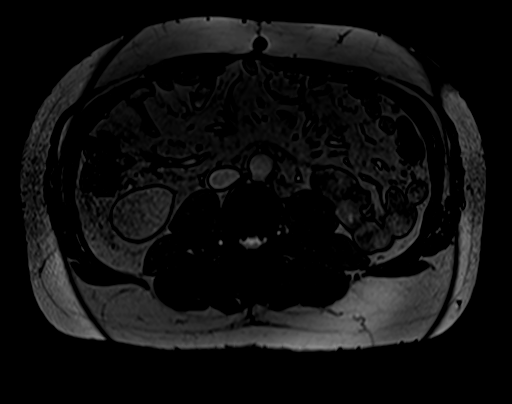
[im 64/64]
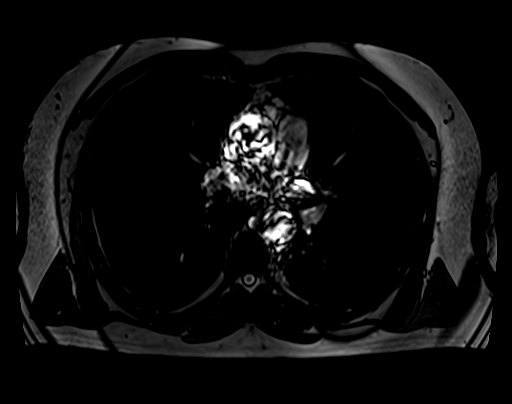

[Series 13: T1 dynamic · axial · 3.0mm · 1.41mm/px · z∈[-177,+84]mm · 3 of 88 slices shown (1 of 6)]
[im 1/88]
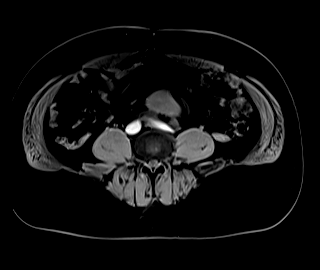
[im 44/88]
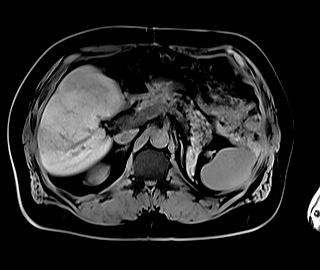
[im 88/88]
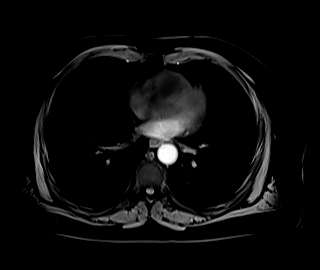

[Series 16: T1 dynamic · axial · 3.0mm · 1.41mm/px · z∈[-177,+84]mm · 3 of 88 slices shown (2 of 6)]
[im 1/88]
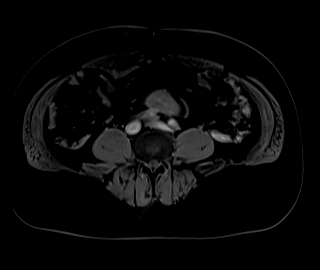
[im 44/88]
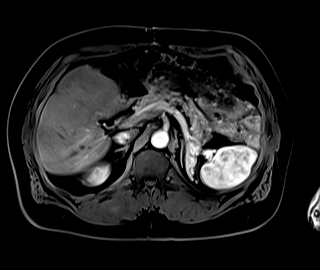
[im 88/88]
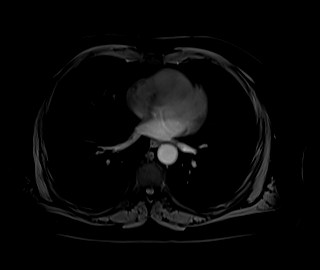

[Series 18: T1 dynamic · axial · 3.0mm · 1.41mm/px · z∈[-177,+84]mm · 3 of 88 slices shown (3 of 6)]
[im 1/88]
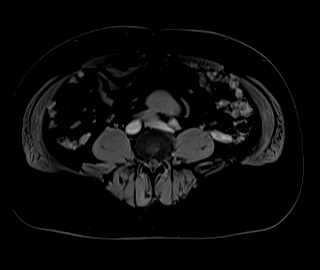
[im 44/88]
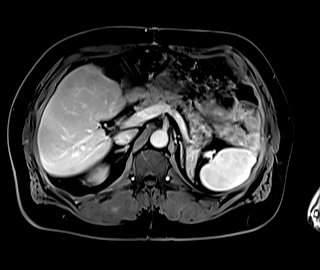
[im 88/88]
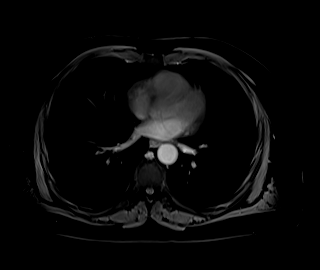

[Series 20: T1 dynamic · axial · 3.0mm · 1.41mm/px · z∈[-177,+84]mm · 3 of 88 slices shown (4 of 6)]
[im 1/88]
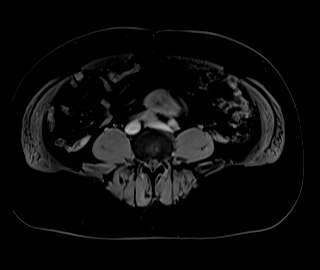
[im 44/88]
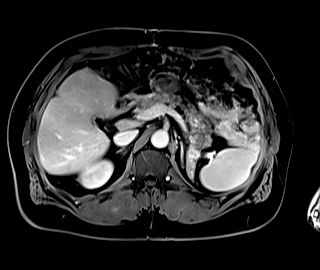
[im 88/88]
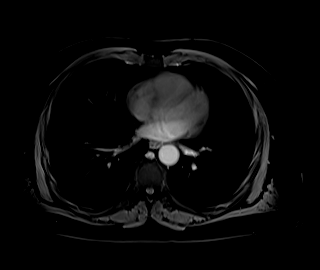

[Series 22: T1 dynamic · coronal · 5.0mm · 1.41mm/px · 2 of 60 slices shown (5 of 6)]
[im 1/60]
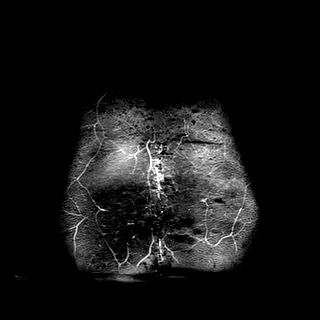
[im 60/60]
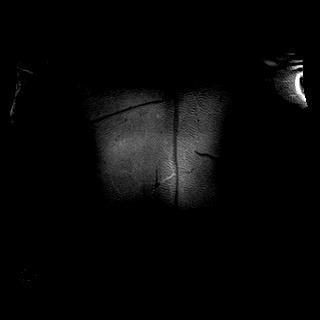

[Series 23: T2 · axial · 6.0mm · 1.76mm/px · 1 of 38 slices shown (2 of 2)]
[im 1/38]
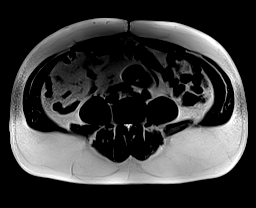

[Series 25: T1 dynamic · axial · 3.0mm · 1.41mm/px · z∈[-177,+84]mm · 3 of 88 slices shown (6 of 6)]
[im 1/88]
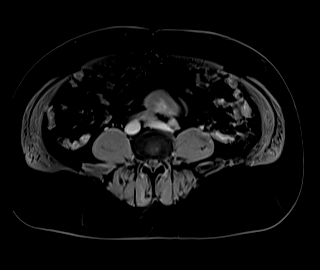
[im 44/88]
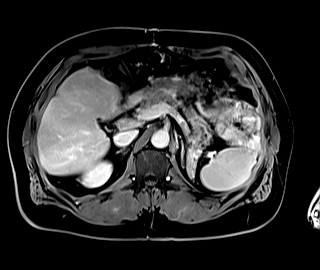
[im 88/88]
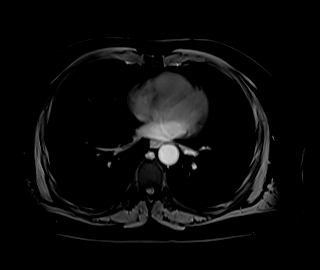

[Series 100: sub_20 sec · axial · 3.0mm · 1.41mm/px · z∈[-177,+84]mm · 3 of 88 slices shown]
[im 1/88]
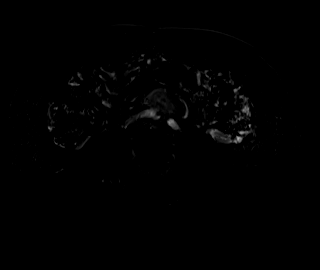
[im 44/88]
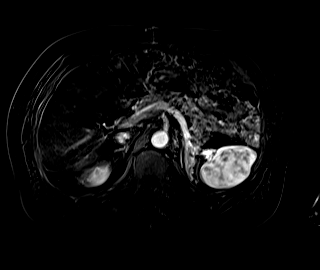
[im 88/88]
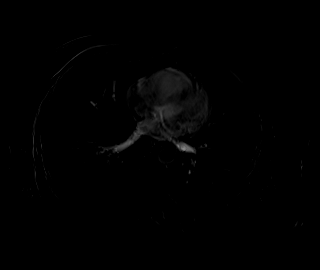

[Series 101: sub_45 sec · axial · 3.0mm · 1.41mm/px · z∈[-177,+84]mm · 3 of 88 slices shown]
[im 1/88]
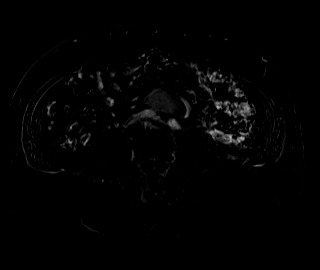
[im 44/88]
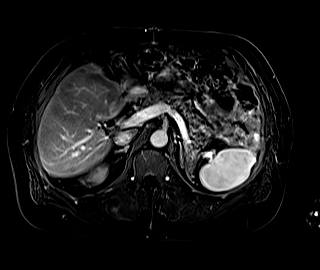
[im 88/88]
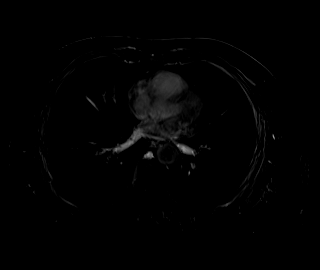

[Series 102: sub_90 sec · axial · 3.0mm · 1.41mm/px · z∈[-177,+84]mm · 3 of 88 slices shown]
[im 1/88]
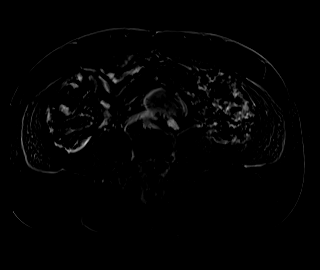
[im 44/88]
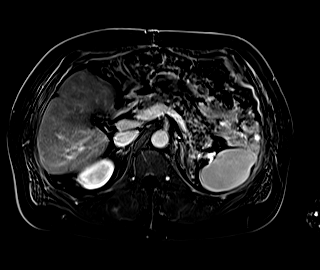
[im 88/88]
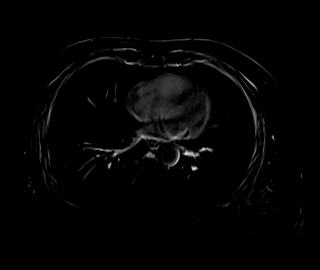

[Series 103: sub_delay · axial · 3.0mm · 1.41mm/px · z∈[-177,+84]mm · 3 of 88 slices shown]
[im 1/88]
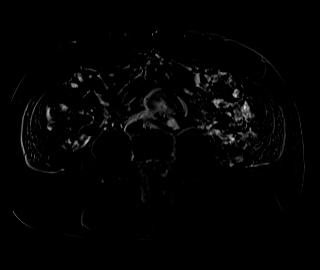
[im 44/88]
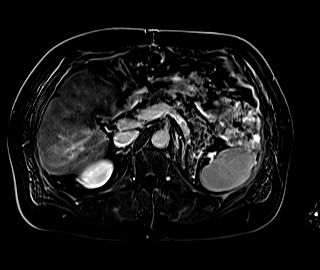
[im 88/88]
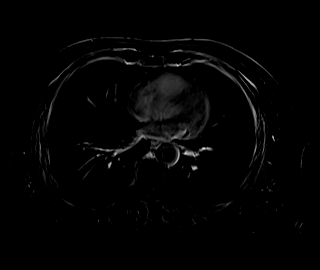

[46 of 48 positions shown; findings below may reference images not displayed]

FINDINGS: Lower chest: Unremarkable.

Hepatobiliary: Susceptibility artifact in the superior aspect of the
right lobe of the liver at the junction of segments 7 and 8,
presumably corresponds to surgical changes from prior partial
hepatectomy. No unusual areas of altered perfusion are noted in the
adjacent hepatic parenchyma of the superior aspect of the right lobe
of the liver. No new suspicious hepatic lesions are noted. No intra
or extrahepatic biliary ductal dilatation. Status post
cholecystectomy.

Pancreas: No pancreatic mass. No pancreatic ductal dilatation. No
pancreatic or peripancreatic fluid collections or inflammatory
changes.

Spleen:  Unremarkable.

Adrenals/Urinary Tract: Left-sided pelvic kidney incompletely
imaged. Right kidney and bilateral adrenal glands are normal in
appearance. No hydroureteronephrosis the visualized portions of the
abdomen.

Stomach/Bowel: Visualized portions are unremarkable.

Vascular/Lymphatic: No aneurysm identified in the visualized
abdominal vasculature. Portal vein is patent. No lymphadenopathy
noted in the abdomen.

Other: No significant volume of ascites noted in the visualized
portions of the peritoneal cavity.

Musculoskeletal: No aggressive appearing osseous lesions are noted
in the visualized portions of the skeleton.
IMPRESSION: 1. Status post partial hepatic resection in the dome of the right
lobe of the liver at junction of segments 7 and 8, with no findings
to suggest residual or locally recurrent disease, and no definite
evidence of new metastatic disease elsewhere in the abdomen.
2. Left-sided pelvic kidney incompletely imaged.

## 2020-05-25 MED ORDER — GADOBUTROL 1 MMOL/ML IV SOLN
10.0000 mL | Freq: Once | INTRAVENOUS | Status: AC | PRN
  Administered 2020-05-25: 10 mL via INTRAVENOUS

## 2020-05-25 NOTE — Telephone Encounter (Signed)
-----   Message from Derwood Kaplan, MD sent at 05/25/2020  1:39 PM EST ----- Regarding: MRI Tell him/wife MRI clear, will still need appt & labs sometime

## 2020-05-26 ENCOUNTER — Telehealth: Payer: Self-pay

## 2020-05-26 NOTE — Telephone Encounter (Addendum)
Pt's wife, Mickel Baas, returned my call. I notified her of below. I told ehr that scheduling would be calling to set up labs & f/u appt. She verbalized understanding. [phone]   ----- Message from Derwood Kaplan, MD sent at 05/25/2020  1:39 PM EST ----- Regarding: MRI Tell him/wife MRI clear, will still need appt & labs sometime

## 2020-05-27 ENCOUNTER — Telehealth: Payer: Self-pay | Admitting: Oncology

## 2020-05-27 NOTE — Telephone Encounter (Signed)
Per 1/19 Staff Msg, patient scheduled for 1/21 Labs 1:00 pm - Follow Up 1:30 pm.  Left message for patient to call back to confirm Appt

## 2020-05-28 ENCOUNTER — Ambulatory Visit: Payer: BC Managed Care – PPO | Admitting: Oncology

## 2020-05-28 ENCOUNTER — Other Ambulatory Visit: Payer: BC Managed Care – PPO

## 2020-06-07 ENCOUNTER — Telehealth: Payer: Self-pay | Admitting: Oncology

## 2020-06-07 NOTE — Telephone Encounter (Signed)
06/07/20 SPOKE WITH PATIENTS WIFE AND RESCHED APPTS

## 2020-06-09 NOTE — Progress Notes (Incomplete)
[facility]  [address] [place],  [place]  [address] [phone]  Clinic Day:  06/09/2020  Referring physician: No ref. provider found   This document serves as a record of services personally performed by [name], MD. It was created on their behalf by Curry,Lauren E, a trained medical scribe. The creation of this record is based on the scribe's personal observations and the provider's statements to them.   CHIEF COMPLAINT:  CC:  History of stage IIIB sigmoid colon cancer  Current Treatment:  Surveillance   HISTORY OF PRESENT ILLNESS:  Eduardo Hester is a 50 y.o. 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  He presented with fevers and chills in September 2017 and was found to have diverticulitis, which was treated.  CT at that time revealed a 1.7 cm lesion in the liver, which was nonspecific, as well as ectopic left kidney with a cyst.  He was then brought back for a colonoscopy, which revealed a 3.5 cm mass in the sigmoid.  Biopsy revealed high-grade dysplasia.  CT abdomen and pelvis revealed an indeterminate lesion in the liver in addition to wall thickening in the sigmoid colon.  MRI abdomen revealed the lesion in the liver to represent complex cyst versus vascular lesion, however, follow-up in 6 months was recommended.  Air contrast barium enema revealed an apple-core lesion of the sigmoid colon.  Baseline CEA was 0.6.  He underwent surgical resection in November 2017.  Pathology revealed a 4 cm, grade 2, adenocarcinoma of the sigmoid colon, as well as chronic active diverticulitis with an area of perforation, however, the tumor was not perforated.  Tumor invaded through the muscularis propria into the pericolorectal tissues.  4/21 nodes were positive for metastasis.  Margins were clear.  MMR and MSI were normal.  KRAS and BRAF mutations were negative.  He had iron deficiency treated with oral iron supplement in the form of  Hemocyte daily.  Due to his age of diagnosis and family history he underwent testing for hereditary non polyposis colorectal cancer with the Myriad myRisk Hereditary Cancer Panel test.  This did not reveal any clinically significant mutation or variants of uncertain significance.  Repeat MRI abdomen in January 2018 revealed a stable lesion within the right lobe of the liver, most consistent with benign lesion.  There was new small volume ascites seen.  The patient received adjuvant chemotherapy with FOLFOX  for 12 cycles, which was completed in June.  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  He was placed on gabapentin in July, then noticed abdominal bloating, which he attributed to the gabapentin, so he discontinued that after 1 month.  He was seen for routine follow-up in September 2018 with a repeat MRI abdomen to re-evaluate the liver lesion.  That revealed a large volume ascites with findings suspicious for peritoneal disease.  The lesion in the right lobe of the liver was stable and still assistant with a benign lesion.  The patient underwent paracentesis with removal of 4.7 L of fluid, but fluid cytology was negative for malignancy.  PET scan in early October did not reveal any hypermetabolic activity, but moderate ascites was seen.  Ultrasound-guided peritoneal biopsy was negative for malignancy, revealing adipose tissue with fibrosis, patchy inflammation and minimal atypia.  His case was discussed at tumor conference and he was referred to Dr. Noberto Retort for consideration open biopsy.  Dr. Noberto Retort performed colonoscopy in  October 2018, which was negative.  Follow-up colonoscopy in 3 years was recommended.  The patient then underwent exploratory laparotomy with multiple peritoneal biopsies.  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  We therefore recommended observation for the patient.  In November 2018, he was seen in the emergency room with abdominal pain, nausea and vomiting.  Gallbladder ultrasound revealed cholelithiasis with sludge and polyps, as well as mild gallbladder wall thickening.  Mild ascites was also seen.  CT abdomen and pelvis revealed mild diffuse gallbladder wall edema otherwise unchanged from PET-CT done in October.  He underwent cholecystectomy with findings of acute cholecystitis.  Pathology did not reveal any evidence of malignancy.    He was seen off schedule in April 2019, as he presented to Dr. Janace Aris office with abdominal pain and was found to have an elevated bilirubin and liver transaminases.  Due to the laboratory abnormalities, he underwent repeat MRI abdomen at that time, which revealed a stable lesion in the right hepatic lobe, which was felt to be indeterminate, with a 2nd smaller indeterminate lesion in the right hepatic lobe measuring 12 mm, which was new from previous imaging.  No hepatic steatosis or ascites was seen.  CEA was normal.  Hepatitis panel and CMV were negative  we felt the laboratory abnormalities were most likely secondary to viral illness.  He was followed closely and underwent a repeat MRI abdomen in June, which remained stable. He had one again in December of 2019, which was stable.  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  MRI abdomen from January 11th 2021 revealed the mass in the junction of segments 7 and 8 in the liver has increased in size compared to the prior exam, currently 2.9 x 2.1 x 2.5 cm.  The lesion has a branching component, some of which may be from portal vein thrombus or tumor thrombus, even this  branching component appears to enhance on subtraction images.   Labs from January 11th were unremarkable.  CEA was 1.2.  PET scan from January 22nd revealed the liver mass in the junction of segments 7 and 8 to be mildly hypermetabolic with a max SUV of 4.3 compared to typical background liver SUV of 3.5.  Biopsy was pursued on February 2nd, and this is an adenocarcinoma with necrosis, consistent with metastasis from colon primary.  He was referred to Dr. Rolla Etienne with [facility] and had liver resection on February 24th.  We discussed the option of further chemotherapy, including the risks and benefits, and he opted for surveillance.   He is here to review new imaging and test results. He tires easily.  He has occasional constipation.  He denies fevers, chills, night sweats, or other signs of infection. He also denies cardiorespiratory or gastrointestinal issues. He denies pain. His appetite is good. His weight is down 8.8 lbs from the last visit. He has not gotten vaccinated against COVID19 vaccine; his wife is concerned and wants him to get it. I strongly recommended that he get it. He is on Centrum MVI, vitamin C and zinc, in addition to his usual medications.   INTERVAL HISTORY:  Elenore Rota  is here for routine follow up and to review recent imaging results.  MRI abdomen from January 18th revealed  no residual disease of the surgical site, and no new or progressive findings in the abdomen.   His  appetite is good, and he has gained/lost _ pounds since his last visit.  He denies fever, chills or other signs of infection.  He denies nausea, vomiting, bowel issues, or abdominal pain.  He denies sore throat, cough, dyspnea, or chest pain.  REVIEW OF SYSTEMS:  Review of Systems - Oncology   VITALS:  There were no vitals taken for this visit.  Wt Readings from Last 3 Encounters:  No data found for Wt    There is no height or weight on file to calculate BMI.  Performance status (ECOG): {CHL  ONC [id number]  PHYSICAL EXAM:  Physical Exam  LABS:  No flowsheet data found. No flowsheet data found.   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 Follow-up study. EXAM: MRI ABDOMEN WITHOUT AND WITH CONTRAST TECHNIQUE: Multiplanar multisequence MR imaging of the abdomen was performed both before and after the administration of intravenous contrast. CONTRAST:  65m GADAVIST GADOBUTROL 1 MMOL/ML IV SOLN COMPARISON:  Abdominal MRI 01/01/2020. FINDINGS: Lower chest: Unremarkable. Hepatobiliary: Susceptibility artifact in the superior aspect of the right lobe of the liver at the junction of segments 7 and 8, presumably corresponds to surgical changes from prior partial hepatectomy. No unusual areas of altered perfusion are noted in the adjacent hepatic parenchyma of the superior aspect of the right lobe of the liver. No new suspicious hepatic lesions are noted. No intra or extrahepatic biliary ductal dilatation. Status post cholecystectomy. Pancreas: No pancreatic mass. No pancreatic ductal dilatation. No pancreatic or peripancreatic fluid collections or inflammatory changes. Spleen:  Unremarkable. Adrenals/Urinary Tract: Left-sided pelvic kidney incompletely imaged. Right kidney and bilateral adrenal glands are normal in appearance. No hydroureteronephrosis the visualized portions of the abdomen. Stomach/Bowel: Visualized portions are unremarkable. Vascular/Lymphatic: No aneurysm identified in the visualized abdominal vasculature. Portal vein is patent. No  lymphadenopathy noted in the abdomen. Other: No significant volume of ascites noted in the visualized portions of the peritoneal cavity. Musculoskeletal: No aggressive appearing osseous lesions are noted in the visualized portions of the skeleton. IMPRESSION: 1. Status post partial hepatic resection in the dome of the right lobe of the liver at junction of segments 7 and 8, with no findings to suggest residual or locally recurrent disease, and no definite evidence of new metastatic disease elsewhere in the abdomen. 2. Left-sided pelvic kidney incompletely imaged. Electronically Signed   By: DVinnie LangtonM.D.   On: 05/25/2020 08:31     Allergies: No Known Allergies  Current Medications: No current outpatient medications on file.   No current facility-administered medications for this visit.     ASSESSMENT & PLAN:   Assessment:   1. 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  2. He had ascites and peritoneal nodules in 2018 with negative biopsy and negative  fluid cytology.  He was taken for exploratory laparotomy with removal of the nodule and this was confirmed to be benign.  3. Liver metastasis which was resected, and was larger than it appeared on imaging, at 4.7 cm at the time of resection.  The margins are close but seem to be clear.  One margin is uncertain and another is less than 1 mm on the anterior aspect.  We discussed the risks and benefits of chemotherapy versus observation and decided to go with surveillance at this time.  4. 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  We will re-evaluate on the next MRI scan.  Plan: Recent MRI imaging revealed no residual disease of the surgical site, and no new or progressive findings in the abdomen.  We will continue routine MRI imaging every 4 months.  He continues to do well.  We will see him back in 4 months with CBC,  CMP, CEA, and MRI abdomen for reevaluation. They understand and agree with this plan of care.   I provided *** minutes (10:24 AM - 10:24 AM) of face-to-face time during this this encounter and > 50% was spent counseling as documented under my assessment and plan.    Derwood Kaplan, MD [facility] [address] [place] [address] Dept: [phone] Dept Fax: [phone]   I, Rita Ohara, am acting as scribe for Derwood Kaplan, MD  I have reviewed this report as typed by the medical scribe, and it is complete and accurate.

## 2020-06-10 NOTE — Progress Notes (Signed)
**Note Eduardo-Identified via Obfuscation** Eduardo Hester  [address] [place],  [name]  [address] [phone]  Clinic Day:  06/11/2020  Referring physician: No ref. provider found   This document serves as a record of services personally performed by [name], MD. It was created on their behalf by Curry,Lauren E, a trained medical scribe. The creation of this record is based on the scribe's personal observations and the provider's statements to them.   CHIEF COMPLAINT:  CC: History of stage IIIB sigmoid colon cancer  Current Treatment:  Surveillance   HISTORY OF PRESENT ILLNESS:  Eduardo Hester is a 50 y.o. 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  He presented with fevers and chills in September 2017 and was found to have diverticulitis, which was treated.  CT at that time revealed a 1.7 cm lesion in the liver, which was nonspecific, as well as ectopic left kidney with a cyst.  He was then brought back for a colonoscopy, which revealed a 3.5 cm mass in the sigmoid.  Biopsy revealed high-grade dysplasia.  CT abdomen and pelvis revealed an indeterminate lesion in the liver in addition to wall thickening in the sigmoid colon.  MRI abdomen revealed the lesion in the liver to represent complex cyst versus vascular lesion.  Air contrast barium enema revealed an apple-core lesion of the sigmoid colon.  Baseline CEA was 0.6.  He underwent surgical resection in November 2017.  Pathology revealed a 4 cm, grade 2, adenocarcinoma of the sigmoid colon, as well as chronic active diverticulitis with an area of perforation, however, the tumor was not perforated.  Tumor invaded through the muscularis propria into the pericolorectal tissues.  4/21 nodes were positive for metastasis.  Margins were clear.  MMR and MSI were normal.  KRAS and BRAF mutations were negative.  He had iron deficiency treated with oral iron supplement in the form of Hemocyte daily.  Due to his age of  diagnosis and family history he underwent testing for hereditary non polyposis colorectal cancer with the My[employer] myRisk Hereditary Cancer Panel test.  This did not reveal any clinically significant mutation or variants of uncertain significance.  Repeat MRI abdomen in January 2018 revealed a stable lesion within the right lobe of the liver, most consistent with benign lesion.  There was new small volume ascites seen.  The patient received adjuvant chemotherapy with FOLFOX  for 12 cycles, which was completed in June.  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  He was seen for routine follow-up in September 2018 with a repeat MRI abdomen to re-evaluate the liver lesion.  That revealed a large volume ascites with findings suspicious for peritoneal disease.  The lesion in the right lobe of the liver was stable and still assistant with a benign lesion.  The patient underwent paracentesis with removal of 4.7 L of fluid, but fluid cytology was negative for malignancy.  PET scan in early October did not reveal any hypermetabolic activity, but moderate ascites was seen.  Ultrasound-guided peritoneal biopsy was negative for malignancy, revealing adipose tissue with fibrosis, patchy inflammation and minimal atypia.  Dr. Noberto Retort performed colonoscopy in October 2018, which was negative.  The patient then underwent exploratory laparotomy with multiple peritoneal biopsies.  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  We therefore recommended observation for the patient.  In November 2018, gallbladder ultrasound revealed cholelithiasis with sludge and polyps, as well as mild gallbladder wall thickening.  Mild ascites was also seen.  CT abdomen and pelvis revealed mild diffuse gallbladder wall edema otherwise unchanged from PET-CT done in  October.  He underwent cholecystectomy with findings of acute cholecystitis.  Pathology did not reveal any evidence of malignancy.    In April 2019, as he was found to have abdominal pain and an elevated bilirubin and liver transaminases.  Due to the laboratory abnormalities, he underwent repeat MRI abdomen at that time, which revealed a stable lesion in the right hepatic lobe, which was felt to be indeterminate, with a 2nd smaller indeterminate lesion in the right hepatic lobe measuring 12 mm, which was new from previous imaging.  CEA was normal.  Hepatitis panel and CMV were negative  we felt the laboratory abnormalities were most likely secondary to viral illness.  He was followed closely with multiple MRI scans.  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  MRI abdomen from January 11th 2021 revealed the mass in the junction of segments 7 and 8 in the liver has increased in size compared to the prior exam, currently 2.9 x 2.1 x 2.5 cm.  The lesion has a branching component, some of which may be from portal vein thrombus or tumor thrombus, even this branching component appears to enhance on subtraction images.  CEA was 1.2.  PET scan from January 22nd revealed the liver mass in the junction of segments 7 and 8 to be mildly hypermetabolic with a max SUV of 4.3 compared to typical background liver SUV of 3.5.  Biopsy was pursued on February 2nd, and this is an adenocarcinoma with necrosis, consistent with metastasis from colon primary.  He was referred to Dr. Rolla Etienne with [facility] and had liver resection on February 24th.  We discussed the option of further chemotherapy, including the risks and benefits, and he opted for surveillance.   INTERVAL HISTORY:   [name] is here for routine follow up and to review imaging results.   MRI abdomen from January 18th revealed no residual disease of the surgical site, and no new or progressive findings in the abdomen.  He states that he contracted COVID-19 2 weeks ago, and had fever, headache and fatigue.  He states that he feels improved but does have residual fatigue.  Blood counts and chemistries are unremarkable.  His  appetite is good, and he has lost 12 pounds since May 2021.  He denies fever, chills or other signs of infection.  He denies nausea, vomiting, bowel issues, or abdominal pain.  He denies sore throat, cough, dyspnea, or chest pain.  REVIEW OF SYSTEMS:  Review of Systems  Constitutional: Positive for fatigue (he feels this is residual from recent COVID infection).  HENT:  Negative.   Eyes: Negative.   Respiratory: Negative.   Cardiovascular: Negative.   Gastrointestinal: Negative.   Endocrine: Negative.   Genitourinary: Negative.    Musculoskeletal: Negative.   Skin: Negative.   Neurological: Negative.   Hematological: Negative.   Psychiatric/Behavioral: Negative.   All other systems reviewed and are negative.    VITALS:  Blood pressure 133/90, pulse 86, temperature 98.6 F (37 C), temperature source Oral,  resp. rate 18, height 6' 6"  (1.981 m), weight 266 lb 12.8 oz (121 kg), SpO2 98 %.  Wt Readings from Last 3 Encounters:  06/11/20 266 lb 12.8 oz (121 kg)    Body mass index is 30.83 kg/m.  Performance status (ECOG): 1 - Symptomatic but completely ambulatory  PHYSICAL EXAM:  Physical Exam Constitutional:      General: He is not in acute distress.    Appearance: Normal appearance. He is normal weight.  HENT:     Head: Normocephalic and atraumatic.  Eyes:     General: No scleral icterus.    Extraocular Movements: Extraocular movements intact.     Conjunctiva/sclera: Conjunctivae normal.     Pupils: Pupils are equal, round, and reactive to light.  Cardiovascular:     Rate  and Rhythm: Normal rate and regular rhythm.     Pulses: Normal pulses.     Heart sounds: Normal heart sounds. No murmur heard. No friction rub. No gallop.   Pulmonary:     Effort: Pulmonary effort is normal. No respiratory distress.     Breath sounds: Normal breath sounds.  Abdominal:     General: Bowel sounds are normal. There is no distension.     Palpations: Abdomen is soft. There is no hepatomegaly, splenomegaly or mass.     Tenderness: There is no abdominal tenderness.     Comments: Deep well healed scar in the midline of the upper abdomen.  Musculoskeletal:        General: Normal range of motion.     Cervical back: Normal range of motion and neck supple.     Right lower leg: No edema.     Left lower leg: No edema.  Lymphadenopathy:     Cervical: No cervical adenopathy.  Skin:    General: Skin is warm and dry.  Neurological:     General: No focal deficit present.     Mental Status: He is alert and oriented to person, place, and time. Mental status is at baseline.  Psychiatric:        Mood and Affect: Mood normal.        Behavior: Behavior normal.        Thought Content: Thought content normal.        Judgment: Judgment normal.     LABS:  No flowsheet data found. No flowsheet data found.   No results found for: CEA1 / No results found for: CEA1   STUDIES:  MR Abdomen W Wo Contrast  Result Date: 05/25/2020 CLINICAL DATA:  50 year old male with history of gastrointestinal cancer (colon cancer with liver metastases). Follow-up study. EXAM: MRI ABDOMEN WITHOUT AND WITH CONTRAST TECHNIQUE: Multiplanar multisequence MR imaging of the abdomen was performed both before and after the administration of intravenous contrast. CONTRAST:  58m GADAVIST GADOBUTROL 1 MMOL/ML IV SOLN COMPARISON:  Abdominal MRI 01/01/2020. FINDINGS: Lower chest: Unremarkable. Hepatobiliary: Susceptibility artifact in the superior aspect of the right lobe of the liver at the junction of segments 7 and 8,  presumably corresponds to surgical changes from prior partial hepatectomy. No unusual areas of altered perfusion are noted in the adjacent hepatic parenchyma of the superior aspect of the right lobe of the liver. No new suspicious hepatic lesions are noted. No intra or extrahepatic biliary ductal dilatation. Status post cholecystectomy. Pancreas: No pancreatic mass. No pancreatic ductal dilatation. No pancreatic or peripancreatic fluid collections or inflammatory changes. Spleen:  Unremarkable. Adrenals/Urinary Tract: Left-sided pelvic kidney incompletely imaged. Right kidney and bilateral adrenal glands are  normal in appearance. No hydroureteronephrosis the visualized portions of the abdomen. Stomach/Bowel: Visualized portions are unremarkable. Vascular/Lymphatic: No aneurysm identified in the visualized abdominal vasculature. Portal vein is patent. No lymphadenopathy noted in the abdomen. Other: No significant volume of ascites noted in the visualized portions of the peritoneal cavity. Musculoskeletal: No aggressive appearing osseous lesions are noted in the visualized portions of the skeleton. IMPRESSION: 1. Status post partial hepatic resection in the dome of the right lobe of the liver at junction of segments 7 and 8, with no findings to suggest residual or locally recurrent disease, and no definite evidence of new metastatic disease elsewhere in the abdomen. 2. Left-sided pelvic kidney incompletely imaged. Electronically Signed   By: Vinnie Langton M.D.   On: 05/25/2020 08:31     Allergies: No Known Allergies  Current Medications: Current Outpatient Medications  Medication Sig Dispense Refill  . ascorbic acid (VITAMIN C) 1000 MG tablet Take by mouth.    . Calcium Polycarbophil (FIBER) 625 MG TABS Take by mouth.    . cetirizine (ZYRTEC) 10 MG tablet Take by mouth.    . Cholecalciferol (VITAMIN D) 50 MCG (2000 UT) tablet Take by mouth.    . ELDERBERRY PO Take 1 tablet by mouth daily.    . Multiple  Vitamin (MULTI-VITAMIN) tablet Take 1 tablet by mouth daily.    Marland Kitchen omeprazole (PRILOSEC) 20 MG capsule Take by mouth.    . vitamin B-12 (CYANOCOBALAMIN) 250 MCG tablet Take by mouth.     No current facility-administered medications for this visit.     ASSESSMENT & PLAN:   Assessment:   1. 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  2. He had ascites and peritoneal nodules in 2018 with negative biopsy and negative fluid cytology.  He was taken for exploratory laparotomy with removal of the nodule and this was confirmed to be benign.  3. Liver metastasis which was resected, and was larger than it appeared on imaging, at 4.7 cm at the time of resection.  The margins are close but seem to be clear.  One margin is uncertain and another is less than 1 mm on the anterior aspect.  We discussed the risks and benefits of chemotherapy versus observation and decided to go with surveillance at this time.  4. 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  We will re-evaluate on the next MRI scan.  5.  Recent COVID pneumonia, mild.  He still has residual fatigue, but this should improve with time.  Plan: Recent MRI imaging revealed no residual disease of the surgical site, and no new or progressive findings in the abdomen.  We will continue routine MRI imaging every 6 months.  He continues to do well.  We will see him back in 6 months with CBC, CMP, CEA, and MRI abdomen for reevaluation. They understand and agree with this plan of care.   I provided 20 minutes of face-to-face time during this this encounter and > 50% was spent counseling as documented under my assessment and plan.    Derwood Kaplan, MD [facility] [address] [place] [address] Dept: [phone] Dept Fax: [phone]   I, Rita Ohara, am  acting as scribe for Derwood Kaplan, MD  I have reviewed this report as typed by the medical scribe, and it is complete and accurate.

## 2020-06-11 ENCOUNTER — Other Ambulatory Visit: Payer: Self-pay

## 2020-06-11 ENCOUNTER — Other Ambulatory Visit: Payer: Self-pay | Admitting: Hematology and Oncology

## 2020-06-11 ENCOUNTER — Inpatient Hospital Stay: Payer: BC Managed Care – PPO | Attending: Oncology

## 2020-06-11 ENCOUNTER — Telehealth: Payer: Self-pay | Admitting: Oncology

## 2020-06-11 ENCOUNTER — Ambulatory Visit: Payer: BC Managed Care – PPO | Admitting: Oncology

## 2020-06-11 ENCOUNTER — Encounter: Payer: Self-pay | Admitting: Oncology

## 2020-06-11 ENCOUNTER — Inpatient Hospital Stay (HOSPITAL_BASED_OUTPATIENT_CLINIC_OR_DEPARTMENT_OTHER): Payer: BC Managed Care – PPO | Admitting: Oncology

## 2020-06-11 ENCOUNTER — Other Ambulatory Visit: Payer: Self-pay | Admitting: Oncology

## 2020-06-11 ENCOUNTER — Other Ambulatory Visit: Payer: BC Managed Care – PPO

## 2020-06-11 VITALS — BP 133/90 | HR 86 | Temp 98.6°F | Resp 18 | Ht 78.0 in | Wt 266.8 lb

## 2020-06-11 DIAGNOSIS — C189 Malignant neoplasm of colon, unspecified: Secondary | ICD-10-CM | POA: Diagnosis not present

## 2020-06-11 DIAGNOSIS — C187 Malignant neoplasm of sigmoid colon: Secondary | ICD-10-CM | POA: Insufficient documentation

## 2020-06-11 DIAGNOSIS — C787 Secondary malignant neoplasm of liver and intrahepatic bile duct: Secondary | ICD-10-CM

## 2020-06-11 LAB — CBC AND DIFFERENTIAL
HCT: 44 (ref 41–53)
Hemoglobin: 14.8 (ref 13.5–17.5)
Neutrophils Absolute: 3.37
Platelets: 276 (ref 150–399)
WBC: 5.1

## 2020-06-11 LAB — BASIC METABOLIC PANEL
BUN: 10 (ref 4–21)
CO2: 29 — AB (ref 13–22)
Chloride: 105 (ref 99–108)
Creatinine: 1 (ref 0.6–1.3)
Glucose: 108
Potassium: 4 (ref 3.4–5.3)
Sodium: 141 (ref 137–147)

## 2020-06-11 LAB — COMPREHENSIVE METABOLIC PANEL
Albumin: 4.1 (ref 3.5–5.0)
Calcium: 8.6 — AB (ref 8.7–10.7)

## 2020-06-11 LAB — CBC: RBC: 5 (ref 3.87–5.11)

## 2020-06-11 LAB — HEPATIC FUNCTION PANEL
ALT: 24 (ref 10–40)
AST: 32 (ref 14–40)
Alkaline Phosphatase: 78 (ref 25–125)
Bilirubin, Total: 1.1

## 2020-06-11 NOTE — Telephone Encounter (Signed)
Per 2/4 los next appt sched and given to patient 

## 2020-06-12 LAB — CEA: CEA: 0.6 ng/mL (ref 0.0–4.7)

## 2020-12-04 DIAGNOSIS — R0981 Nasal congestion: Secondary | ICD-10-CM | POA: Diagnosis not present

## 2020-12-04 DIAGNOSIS — J0100 Acute maxillary sinusitis, unspecified: Secondary | ICD-10-CM | POA: Diagnosis not present

## 2020-12-04 DIAGNOSIS — Z20828 Contact with and (suspected) exposure to other viral communicable diseases: Secondary | ICD-10-CM | POA: Diagnosis not present

## 2020-12-09 ENCOUNTER — Inpatient Hospital Stay: Payer: BC Managed Care – PPO | Admitting: Oncology

## 2020-12-23 ENCOUNTER — Telehealth: Payer: Self-pay | Admitting: Oncology

## 2020-12-23 NOTE — Telephone Encounter (Signed)
12/23/20 Spoke with patients wife and schedule appts.

## 2020-12-29 NOTE — Progress Notes (Signed)
[facility]  [address] [place],  [place]  [address] [phone]  Clinic Day:  01/06/2021  Referring physician: Angelina Sheriff, MD   This document serves as a record of services personally performed by [name], MD. It was created on their behalf by [facility] E, a trained medical scribe. The creation of this record is based on the scribe's personal observations and the provider's statements to them.  CHIEF COMPLAINT:  CC: History of stage IIIB sigmoid colon cancer  Current Treatment:  Surveillance   HISTORY OF PRESENT ILLNESS:  Eduardo Hester is a 50 y.o. 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  He presented with fevers and chills in September 2017 and was found to have diverticulitis, which was treated.  CT at that time revealed a 1.7 cm lesion in the liver, which was nonspecific, as well as ectopic left kidney with a cyst.  He was then brought back for a colonoscopy, which revealed a 3.5 cm mass in the sigmoid.  Biopsy revealed high-grade dysplasia.  CT abdomen and pelvis revealed an indeterminate lesion in the liver in addition to wall thickening in the sigmoid colon.  MRI abdomen revealed the lesion in the liver to represent complex cyst versus vascular lesion.  Air contrast barium enema revealed an apple-core lesion of the sigmoid colon.  Baseline CEA was 0.6.  He underwent surgical resection in November 2017.  Pathology revealed a 4 cm, grade 2, adenocarcinoma of the sigmoid colon, as well as chronic active diverticulitis with an area of perforation, however, the tumor was not perforated.  Tumor invaded through the muscularis propria into the pericolorectal tissues.  4/21 nodes were positive for metastasis.  Margins were clear.  MMR and MSI were normal.  KRAS and BRAF mutations were negative.  He had iron deficiency treated with oral iron supplement in the form of Hemocyte daily.  Due to his age of diagnosis  and family history he underwent testing for hereditary non polyposis colorectal cancer with the Myriad myRisk Hereditary Cancer Panel test.  This did not reveal any clinically significant mutation or variants of uncertain significance.  Repeat MRI abdomen in January 2018 revealed a stable lesion within the right lobe of the liver, most consistent with benign lesion.  There was new small volume ascites seen.  The patient received adjuvant chemotherapy with FOLFOX  for 12 cycles, which was completed in June.  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  He was seen for routine follow-up in September 2018 with a repeat MRI abdomen to re-evaluate the liver lesion.  That revealed a large volume ascites with findings suspicious for peritoneal disease.  The lesion in the right lobe of the liver was stable and still assistant with a benign lesion.  The patient underwent paracentesis with removal of 4.7 L of fluid, but fluid cytology was negative for malignancy.  PET scan in early October did not reveal any hypermetabolic activity, but moderate ascites was seen.  Ultrasound-guided peritoneal biopsy was negative for malignancy, revealing adipose tissue with fibrosis, patchy inflammation and minimal atypia.  Dr. Noberto Retort performed colonoscopy in October 2018, which was negative.  The patient then underwent exploratory laparotomy with multiple peritoneal biopsies.  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  We therefore recommended observation for the patient.  In November 2018, gallbladder ultrasound revealed cholelithiasis with sludge and polyps, as well as mild gallbladder wall thickening.  Mild ascites was also seen.  CT abdomen and pelvis revealed mild diffuse gallbladder wall edema otherwise unchanged from PET-CT done in October.  He  underwent cholecystectomy with findings of acute cholecystitis.  Pathology did not reveal any evidence of malignancy.    In April 2019, as he was found to have abdominal pain and an elevated bilirubin and liver transaminases.  Due to the laboratory abnormalities, he underwent repeat MRI abdomen at that time, which revealed a stable lesion in the right hepatic lobe, which was felt to be indeterminate, with a 2nd smaller indeterminate lesion in the right hepatic lobe measuring 12 mm, which was new from previous imaging.  CEA was normal.  Hepatitis panel and CMV were negative  we felt the laboratory abnormalities were most likely secondary to viral illness.  He was followed closely with multiple MRI scans.  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  MRI abdomen from January 11th 2021 revealed the mass in the junction of segments 7 and 8 in the liver has increased in size compared to the prior exam, currently 2.9 x 2.1 x 2.5 cm.  The lesion has a branching component, some of which may be from portal vein thrombus or tumor thrombus, even this branching component appears to enhance on subtraction images.  CEA was 1.2.  PET scan from January 22nd revealed the liver mass in the junction of segments 7 and 8 to be mildly hypermetabolic with a max SUV of 4.3 compared to typical background liver SUV of 3.5.  Biopsy was pursued on February 2nd, and this is an adenocarcinoma with necrosis, consistent with metastasis from colon primary.  He was referred to Dr. Rolla Etienne with [facility] and had liver resection on February 24th.  We discussed the option of further chemotherapy, including the risks and benefits, and he opted for surveillance.   INTERVAL HISTORY:  Eduardo Hester is here  for routine follow up and states that he has been well and denies complaints.  MRI abdomen and pelvis from August revealed no evidence of recurrent or metastatic disease in the abdomen or pelvis. No lymphadenopathy. Re-demonstrated susceptibility artifact at the anterior and central aspect of the liver dome, in keeping with prior partial right hepatectomy. Left pelvic kidney with a large, exophytic cyst of the inferior pole measuring 11.0 cm.  Blood counts and chemistries are all normal, as well as his CEA of 0.8.  His  appetite is good, and he has gained 1 pound since his last visit.  He denies fever, chills or other signs of infection.  He denies nausea, vomiting, bowel issues, or abdominal pain.  He denies sore throat, cough, dyspnea, or chest pain.  REVIEW OF SYSTEMS:  Review of Systems  Constitutional: Negative.  Negative for appetite change, chills, fatigue, fever and unexpected weight change.  HENT:  Negative.    Eyes: Negative.   Respiratory: Negative.  Negative for chest tightness, cough, hemoptysis, shortness of breath and wheezing.   Cardiovascular: Negative.  Negative for chest pain, leg swelling and palpitations.  Gastrointestinal: Negative.  Negative for abdominal distention, abdominal pain, blood in stool, constipation, diarrhea, nausea and vomiting.  Endocrine: Negative.   Genitourinary: Negative.  Negative for difficulty urinating, dysuria, frequency and hematuria.   Musculoskeletal: Negative.  Negative for arthralgias, back pain, flank pain, gait problem and myalgias.  Skin: Negative.   Neurological: Negative.  Negative for dizziness, extremity weakness, gait problem, headaches, light-headedness, numbness, seizures and speech difficulty.  Hematological: Negative.   Psychiatric/Behavioral: Negative.  Negative for depression and sleep disturbance. The patient is not nervous/anxious.   All other systems reviewed and are negative.   VITALS:  Blood pressure (!) 135/93, pulse 62,  temperature 97.7 F (36.5 C), resp. rate 20, weight 267 lb (121.1 kg), SpO2 99 %.  Wt Readings from Last 3 Encounters:  01/06/21 267 lb (121.1 kg)  06/11/20 266 lb 12.8 oz (121 kg)    Body mass index is 30.85 kg/m.  Performance status (ECOG): 0 - Asymptomatic  PHYSICAL EXAM:  Physical Exam Constitutional:      General: He is not in acute distress.    Appearance: Normal appearance. He is normal weight.  HENT:     Head: Normocephalic and atraumatic.  Eyes:     General: No scleral icterus.    Extraocular Movements: Extraocular movements intact.     Conjunctiva/sclera: Conjunctivae normal.     Pupils: Pupils are equal, round, and reactive to light.  Cardiovascular:     Rate and Rhythm: Normal rate and regular rhythm.     Pulses: Normal pulses.     Heart sounds: Normal heart sounds. No murmur heard.   No friction rub. No gallop.  Pulmonary:     Effort: Pulmonary effort is normal. No respiratory distress.     Breath sounds: Normal breath sounds.  Abdominal:     General: Bowel sounds are normal. There is no distension.     Palpations: Abdomen is soft. There is no hepatomegaly, splenomegaly or mass.     Tenderness: There is no abdominal tenderness.     Comments: Large well healed incision in the center of the upper abdomen.  Musculoskeletal:        General: Normal range of motion.     Cervical back: Normal range of motion and neck supple.     Right lower leg: No edema.     Left lower leg: No edema.  Lymphadenopathy:     Cervical: No cervical adenopathy.  Skin:    General: Skin is warm and dry.  Neurological:     General: No focal deficit present.     Mental Status: He is alert and oriented to person, place, and time. Mental status is at baseline.  Psychiatric:        Mood and Affect: Mood normal.        Behavior: Behavior normal.        Thought Content: Thought content normal.        Judgment: Judgment normal.    LABS:   CBC Latest Ref Rng & Units 01/06/2021 06/11/2020   WBC - 5.9 5.1  Hemoglobin 13.5 - 17.5 15.0 14.8  Hematocrit 41 - 53 44 44  Platelets 150 - 399 203 276   CMP Latest Ref Rng & Units 01/06/2021 06/11/2020  BUN 4 - 21 14 10   Creatinine 0.6 - 1.3 1.1 1.0  Sodium 137 - 147 138 141  Potassium 3.4 - 5.3 4.2 4.0  Chloride 99 - 108 101 105  CO2 13 - 22 30(A) 29(A)  Calcium 8.7 - 10.7 9.1 8.6(A)  Alkaline Phos 25 - 125 89 78  AST 14 - 40 33 32  ALT 10 - 40 24 24     Lab Results  Component Value Date   CEA1 0.6 06/11/2020   /  CEA  Date Value Ref Range Status  06/11/2020 0.6 0.0 - 4.7 ng/mL Final    Comment:    (NOTE)                             Nonsmokers          <3.9                             Smokers             <5.6 [employer] Electrochemiluminescence Immunoassay (ECLIA) Values obtained with different assay methods or kits cannot be used interchangeably.  Results cannot be interpreted as absolute evidence of the presence or absence of malignant disease. Performed At: [facility] [place], [place] [id number] Rush Farmer MD VE:[id number]      STUDIES:  MR Pelvis W Wo Contrast  Result Date: 12/31/2020 CLINICAL DATA:  Colorectal cancer restaging, assess treatment response, liver metastases status post partial hepatectomy EXAM: MRI ABDOMEN AND PELVIS WITHOUT AND WITH CONTRAST TECHNIQUE: Multiplanar multisequence MR imaging of the abdomen and pelvis was performed both before and after the administration of intravenous contrast. CONTRAST:  27m GADAVIST GADOBUTROL 1 MMOL/ML IV SOLN COMPARISON:  MR abdomen, 05/25/2020 FINDINGS: COMBINED FINDINGS FOR BOTH MR ABDOMEN AND PELVIS Lower chest: No acute findings. Hepatobiliary: Redemonstrated susceptibility artifact at the anterior and central aspect of the liver dome (series 12, image 35, series 20, image 35). No suspicious mass or other parenchymal abnormality identified. Status post cholecystectomy. No biliary ductal dilatation. Pancreas: No mass,  inflammatory changes, or other parenchymal abnormality identified. No pancreatic ductal dilatation Spleen:  Within normal limits in size and appearance. Adrenals/Urinary Tract: No masses identified. Discoid left adrenal. Left pelvic kidney with a large, exophytic cyst of the inferior pole measuring 11.0 cm. No evidence of hydronephrosis. Stomach/Bowel: Visualized portions within the abdomen are unremarkable. Vascular/Lymphatic: No pathologically enlarged lymph nodes identified. No abdominal aortic aneurysm demonstrated. Reproductive: Prostate is unremarkable. Other:  None. Musculoskeletal: No suspicious bone lesions identified. IMPRESSION: 1. No evidence of recurrent or metastatic disease in the abdomen or pelvis. No lymphadenopathy. 2. Redemonstrated susceptibility artifact at the anterior and central aspect of the liver dome, in keeping with prior partial right hepatectomy. 3. Left pelvic kidney with a large, exophytic cyst of the inferior pole measuring 11.0 cm. Although benign, this may be symptomatic due to large size and abnormal pelvic location. Electronically Signed   By: AEddie CandleM.D.   On: 12/31/2020 14:55   MR Abdomen W Wo Contrast  Result Date: 12/31/2020 CLINICAL DATA:  Colorectal cancer restaging, assess treatment response, liver metastases status post partial hepatectomy EXAM: MRI ABDOMEN AND PELVIS WITHOUT AND WITH CONTRAST TECHNIQUE: Multiplanar multisequence MR imaging of the abdomen and pelvis was performed both before and after the administration of intravenous contrast. CONTRAST:  16mGADAVIST GADOBUTROL 1 MMOL/ML IV SOLN COMPARISON:  MR abdomen, 05/25/2020 FINDINGS: COMBINED FINDINGS FOR BOTH MR ABDOMEN AND PELVIS Lower chest: No acute findings. Hepatobiliary: Redemonstrated susceptibility artifact at the anterior and central aspect of the liver dome (series 12, image 35, series 20, image 35). No suspicious mass or other parenchymal abnormality identified. Status post cholecystectomy.  No biliary ductal dilatation. Pancreas: No mass, inflammatory changes, or other parenchymal abnormality identified.  No pancreatic ductal dilatation Spleen:  Within normal limits in size and appearance. Adrenals/Urinary Tract: No masses identified. Discoid left adrenal. Left pelvic kidney with a large, exophytic cyst of the inferior pole measuring 11.0 cm. No evidence of hydronephrosis. Stomach/Bowel: Visualized portions within the abdomen are unremarkable. Vascular/Lymphatic: No pathologically enlarged lymph nodes identified. No abdominal aortic aneurysm demonstrated. Reproductive: Prostate is unremarkable. Other:  None. Musculoskeletal: No suspicious bone lesions identified. IMPRESSION: 1. No evidence of recurrent or metastatic disease in the abdomen or pelvis. No lymphadenopathy. 2. Redemonstrated susceptibility artifact at the anterior and central aspect of the liver dome, in keeping with prior partial right hepatectomy. 3. Left pelvic kidney with a large, exophytic cyst of the inferior pole measuring 11.0 cm. Although benign, this may be symptomatic due to large size and abnormal pelvic location. Electronically Signed   By: Eddie Candle M.D.   On: 12/31/2020 14:55      Allergies: No Known Allergies  Current Medications: Current Outpatient Medications  Medication Sig Dispense Refill   zinc gluconate 50 MG tablet Take 50 mg by mouth daily.     ascorbic acid (VITAMIN C) 1000 MG tablet Take by mouth.     Calcium Polycarbophil (FIBER) 625 MG TABS Take by mouth.     cetirizine (ZYRTEC) 10 MG tablet Take by mouth.     Cholecalciferol (VITAMIN D) 50 MCG (2000 UT) tablet Take by mouth.     Multiple Vitamin (MULTI-VITAMIN) tablet Take 1 tablet by mouth daily.     omeprazole (PRILOSEC) 20 MG capsule Take by mouth.     vitamin B-12 (CYANOCOBALAMIN) 250 MCG tablet Take by mouth.     No current facility-administered medications for this visit.     ASSESSMENT & PLAN:   Assessment:   1. Stage IIIB colon  cancer diagnosed in September of 2017 and treated with surgical resection followed by 6 months of adjuvant chemotherapy with FOLFOX.  2. He had ascites and peritoneal nodules in 2018 with negative biopsy and negative fluid cytology.  He was taken for exploratory laparotomy with removal of the nodule and this was confirmed to be benign.  3. Liver metastasis which was resected, and was larger than it appeared on imaging, at 4.7 cm at the time of resection.  The margins are close but seem to be clear.  One margin is uncertain and another is less than 1 mm on the anterior aspect.  We discussed the risks and benefits of chemotherapy versus observation and decided to go with surveillance at this time.  4. 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    5.  COVID pneumonia, mild and resolved.    6.  Large left renal cyst measuring 11 cm, but appears benign.  We will continue to monitor this.  Plan: Recent MRI imaging revealed no residual disease of the surgical site, and no new or progressive findings in the abdomen.  I will call him with the lab results.  We will continue routine MRI imaging once a year.  He continues to do well, so we will see him back in 6 months with CBC, CMP, CEA for reevaluation. They understand and agree with this plan of care.   I provided 15 minutes of face-to-face time during this this encounter and > 50% was spent counseling as documented under my assessment and plan.    Derwood Kaplan, MD [facility] AT [facility] [address] [place] [address] Dept: [phone] Dept Fax:  [place], Rita Ohara, am acting as scribe for Derwood Kaplan, MD  I have reviewed this report as typed by the medical scribe, and it is complete and accurate.

## 2020-12-30 ENCOUNTER — Other Ambulatory Visit: Payer: Self-pay

## 2020-12-30 ENCOUNTER — Ambulatory Visit (HOSPITAL_COMMUNITY)
Admission: RE | Admit: 2020-12-30 | Discharge: 2020-12-30 | Disposition: A | Discharge status: Home / Self Care | Payer: BC Managed Care – PPO | Source: Ambulatory Visit | Attending: Oncology | Admitting: Oncology

## 2020-12-30 DIAGNOSIS — Z9049 Acquired absence of other specified parts of digestive tract: Secondary | ICD-10-CM | POA: Diagnosis not present

## 2020-12-30 DIAGNOSIS — C787 Secondary malignant neoplasm of liver and intrahepatic bile duct: Secondary | ICD-10-CM | POA: Insufficient documentation

## 2020-12-30 DIAGNOSIS — C19 Malignant neoplasm of rectosigmoid junction: Secondary | ICD-10-CM | POA: Diagnosis not present

## 2020-12-30 DIAGNOSIS — C187 Malignant neoplasm of sigmoid colon: Secondary | ICD-10-CM | POA: Insufficient documentation

## 2020-12-30 DIAGNOSIS — N281 Cyst of kidney, acquired: Secondary | ICD-10-CM | POA: Diagnosis not present

## 2020-12-30 IMAGING — MR MR PELVIS WO/W CM
4 of 8 series · 23 of 48 positions shown · IV contrast (gadavist)
Comparison: MR abdomen, [DATE]

CLINICAL DATA: Colorectal cancer restaging, assess treatment
response, liver metastases status post partial hepatectomy

EXAM:
MRI ABDOMEN AND PELVIS WITHOUT AND WITH CONTRAST
TECHNIQUE: Multiplanar multisequence MR imaging of the abdomen and pelvis was
performed both before and after the administration of intravenous
contrast.
CONTRAST:  10mL GADAVIST GADOBUTROL 1 MMOL/ML IV SOLN

[Series 3: T2 · coronal · 5.0mm · 1.56mm/px · 6 of 40 slices shown (1 of 3)]
[im 1/40]
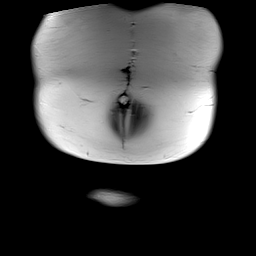
[im 8/40]
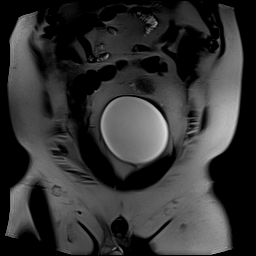
[im 16/40]
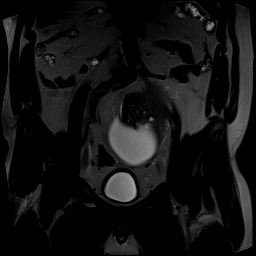
[im 24/40]
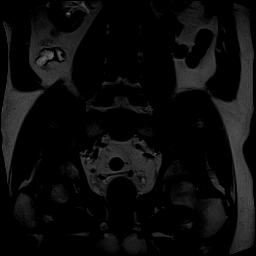
[im 32/40]
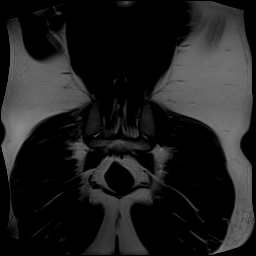
[im 40/40]
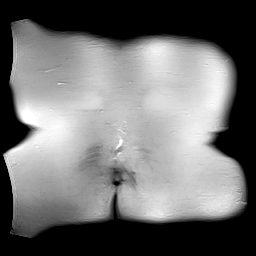

[Series 4: T2 · sagittal · 5.0mm · 0.64mm/px · 6 of 40 slices shown (2 of 3)]
[im 1/40]
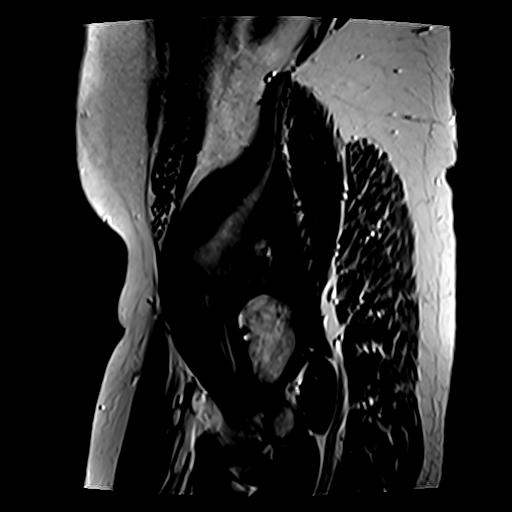
[im 8/40]
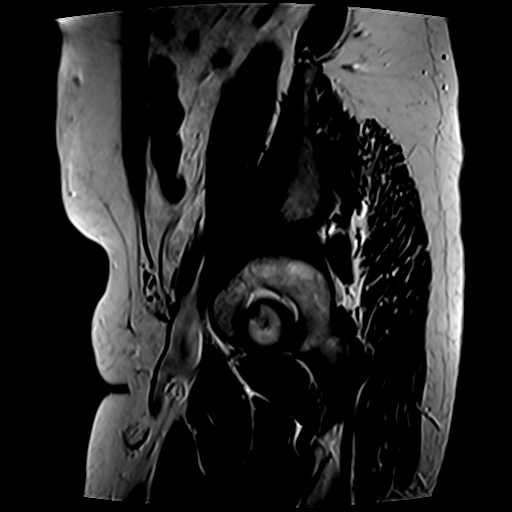
[im 16/40]
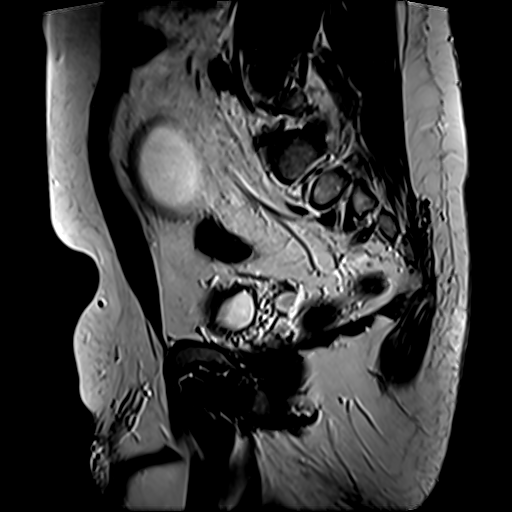
[im 24/40]
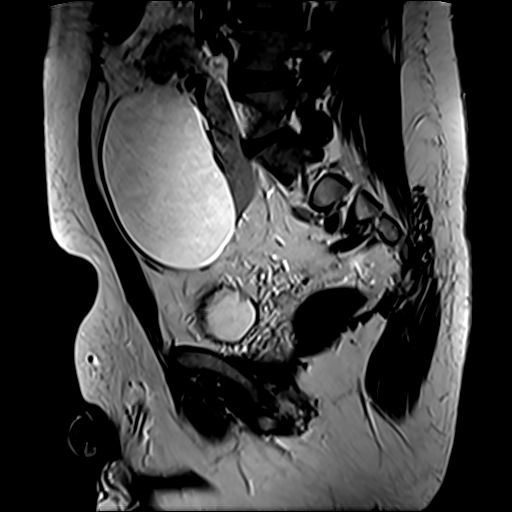
[im 32/40]
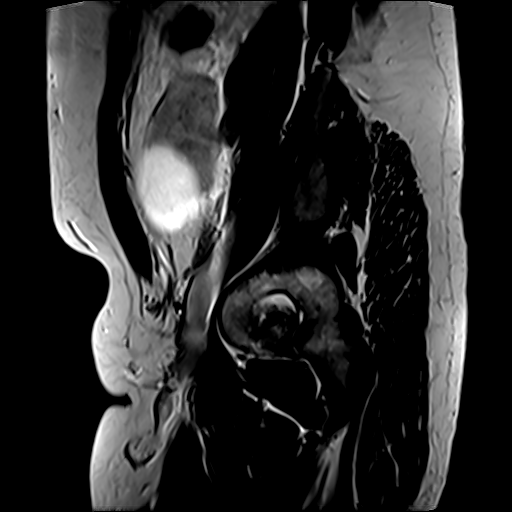
[im 40/40]
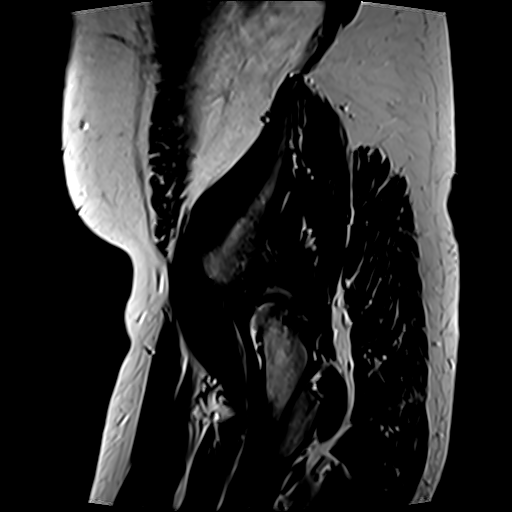

[Series 5: T2 · axial · 5.0mm · 0.51mm/px · z∈[-277,-25]mm · 6 of 43 slices shown (3 of 3)]
[im 1/43]
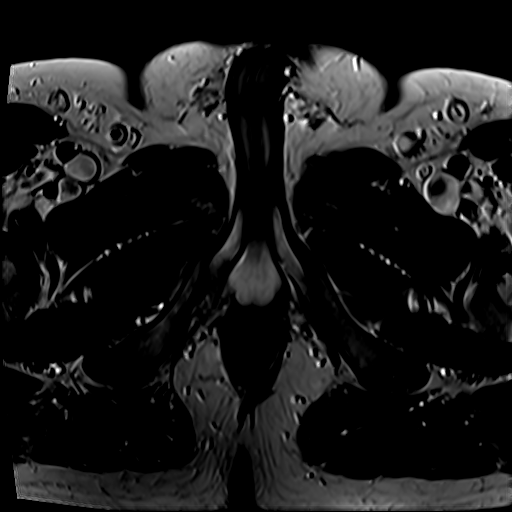
[im 9/43]
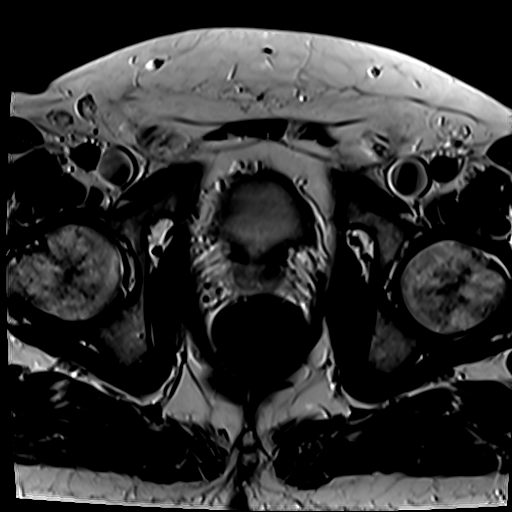
[im 17/43]
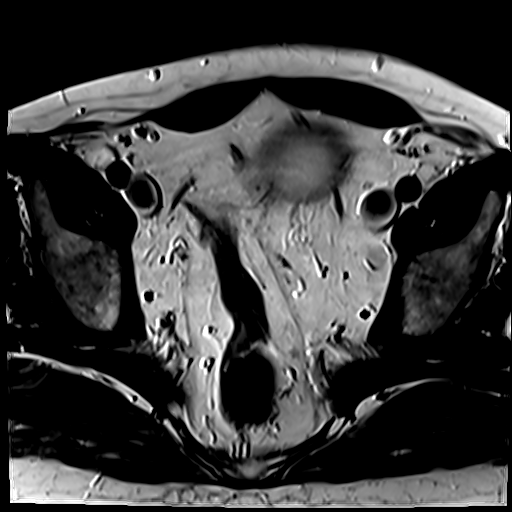
[im 26/43]
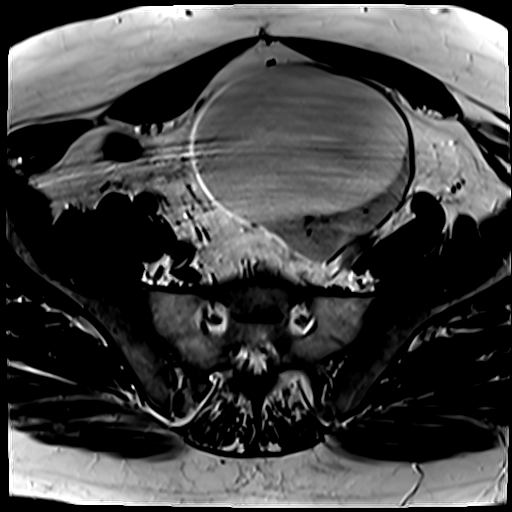
[im 34/43]
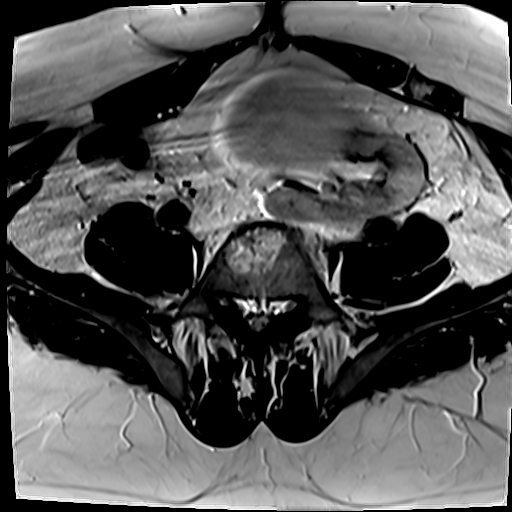
[im 43/43]
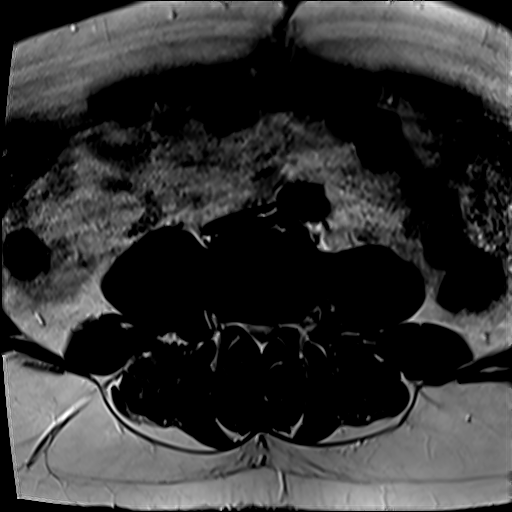

[Series 6: T2 fat-sat · axial · 5.0mm · 0.51mm/px · z∈[-277,-25]mm · 5 of 43 slices shown]
[im 1/43]
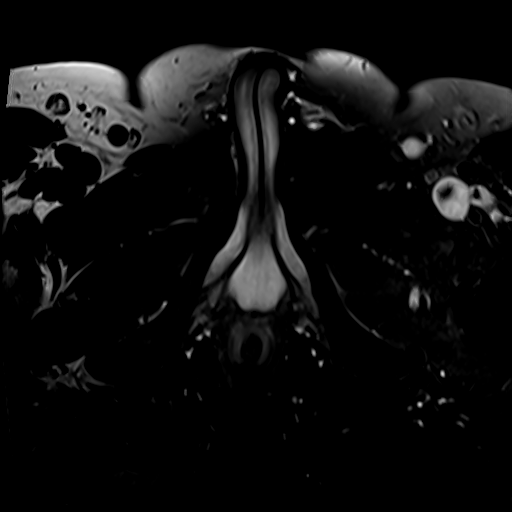
[im 9/43]
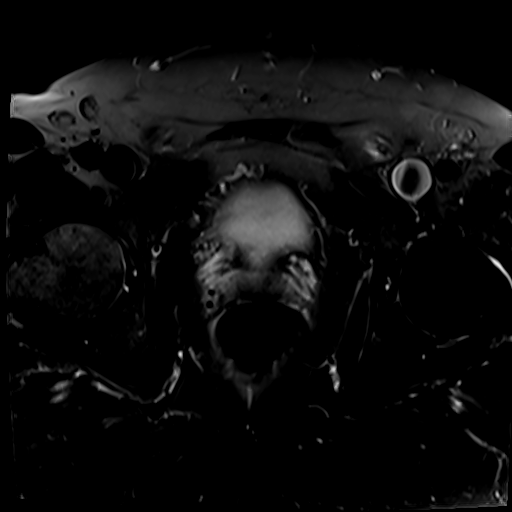
[im 17/43]
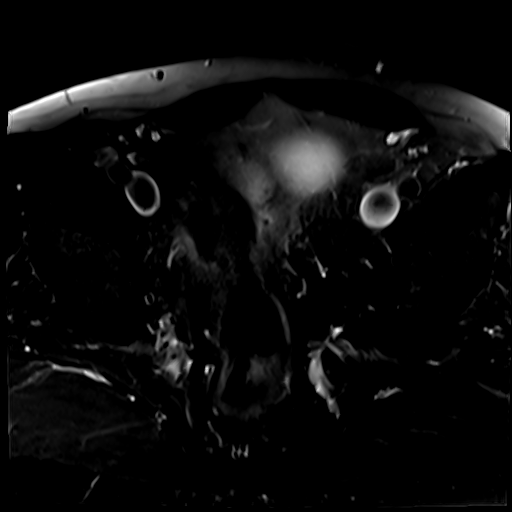
[im 26/43]
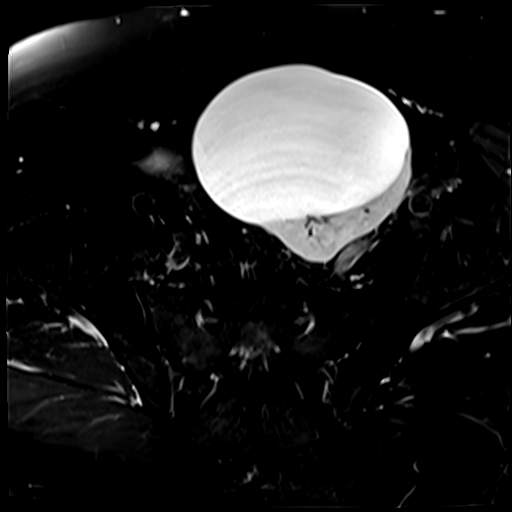
[im 43/43]
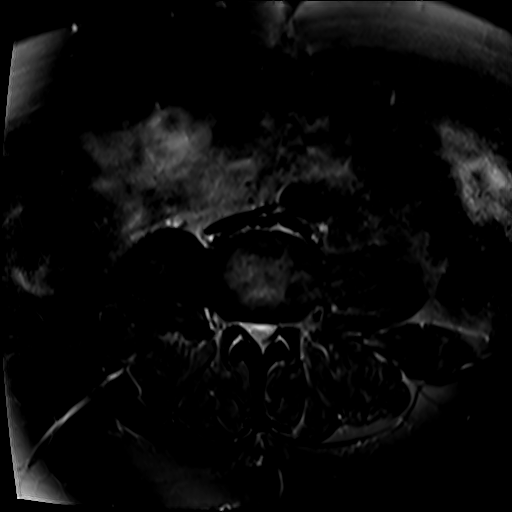

[23 of 48 positions shown; findings below may reference images not displayed]

FINDINGS: COMBINED FINDINGS FOR BOTH MR ABDOMEN AND PELVIS

Lower chest: No acute findings.

Hepatobiliary: Redemonstrated susceptibility artifact at the
anterior and central aspect of the liver dome (series 12, image 35,
series 20, image 35). No suspicious mass or other parenchymal
abnormality identified. Status post cholecystectomy. No biliary
ductal dilatation.

Pancreas: No mass, inflammatory changes, or other parenchymal
abnormality identified. No pancreatic ductal dilatation

Spleen:  Within normal limits in size and appearance.

Adrenals/Urinary Tract: No masses identified. Discoid left adrenal.
Left pelvic kidney with a large, exophytic cyst of the inferior pole
measuring 11.0 cm. No evidence of hydronephrosis.

Stomach/Bowel: Visualized portions within the abdomen are
unremarkable.

Vascular/Lymphatic: No pathologically enlarged lymph nodes
identified. No abdominal aortic aneurysm demonstrated.

Reproductive: Prostate is unremarkable.

Other:  None.

Musculoskeletal: No suspicious bone lesions identified.
IMPRESSION: 1. No evidence of recurrent or metastatic disease in the abdomen or
pelvis. No lymphadenopathy.
2. Redemonstrated susceptibility artifact at the anterior and
central aspect of the liver dome, in keeping with prior partial
right hepatectomy.
3. Left pelvic kidney with a large, exophytic cyst of the inferior
pole measuring 11.0 cm. Although benign, this may be symptomatic due
to large size and abnormal pelvic location.

## 2020-12-30 IMAGING — MR MR ABDOMEN WO/W CM
21 series · 48 of 48 positions shown · IV contrast (10 GADAVIST)
Comparison: MR abdomen, [DATE]

CLINICAL DATA: Colorectal cancer restaging, assess treatment
response, liver metastases status post partial hepatectomy

EXAM:
MRI ABDOMEN AND PELVIS WITHOUT AND WITH CONTRAST
TECHNIQUE: Multiplanar multisequence MR imaging of the abdomen and pelvis was
performed both before and after the administration of intravenous
contrast.
CONTRAST:  10mL GADAVIST GADOBUTROL 1 MMOL/ML IV SOLN

[Series 3: T2 · coronal · 5.0mm · 1.56mm/px · 1 of 36 slices shown (1 of 3)]
[im 1/36]
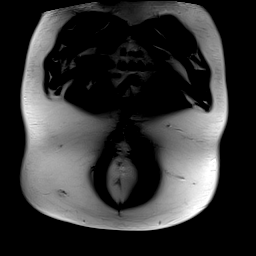

[Series 5: DWI · axial · 6.0mm · 1.49mm/px · z∈[-2,+250]mm · 2 of 72 slices shown (1 of 2)]
[im 1/72]
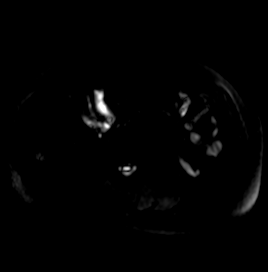
[im 72/72]
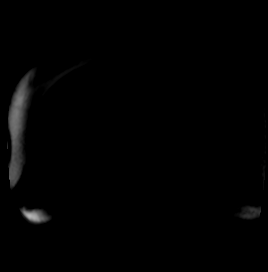

[Series 6: DWI · axial · 6.0mm · 1.49mm/px · 1 of 36 slices shown (2 of 2)]
[im 1/36]
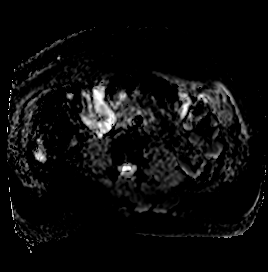

[Series 7: T2 fat-sat · axial · 6.0mm · 1.25mm/px · 1 of 36 slices shown (1 of 2)]
[im 1/36]
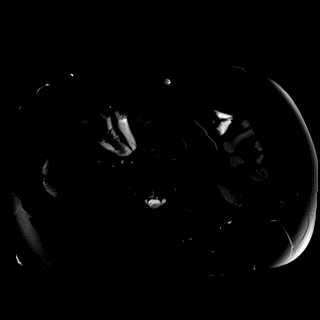

[Series 10: T2 · coronal · 6.0mm · 1.56mm/px · 1 of 40 slices shown (2 of 3)]
[im 1/40]
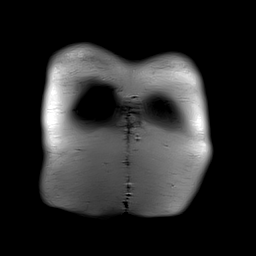

[Series 11: T2 fat-sat · axial · 6.0mm · 1.56mm/px · 1 of 39 slices shown (2 of 2)]
[im 1/39]
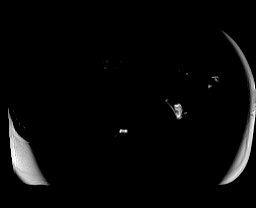

[Series 12: T1 · axial · 3.4mm · 1.25mm/px · z∈[-20,+249]mm · 3 of 80 slices shown (1 of 2)]
[im 1/80]
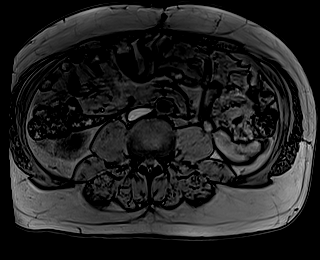
[im 40/80]
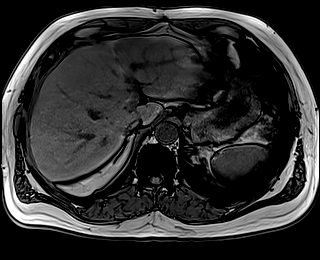
[im 80/80]
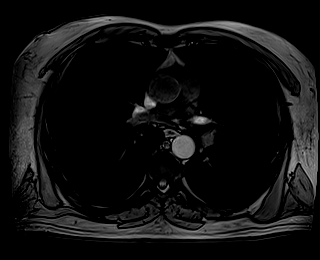

[Series 13: T1 · axial · 3.4mm · 1.25mm/px · z∈[-20,+249]mm · 3 of 80 slices shown (2 of 2)]
[im 1/80]
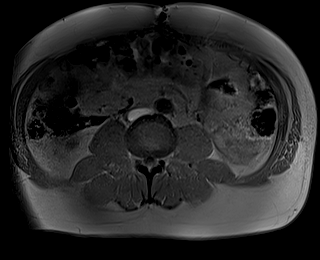
[im 40/80]
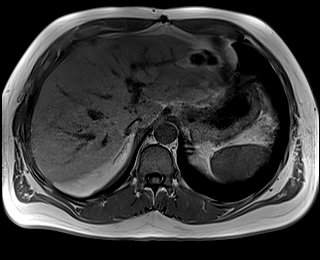
[im 80/80]
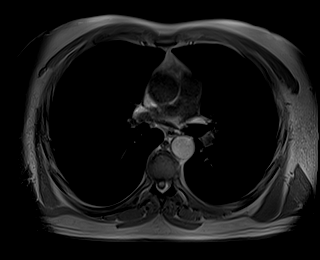

[Series 14: bSSFP · axial · 4.0mm · 0.84mm/px · z∈[-9,+247]mm · 2 of 65 slices shown]
[im 1/65]
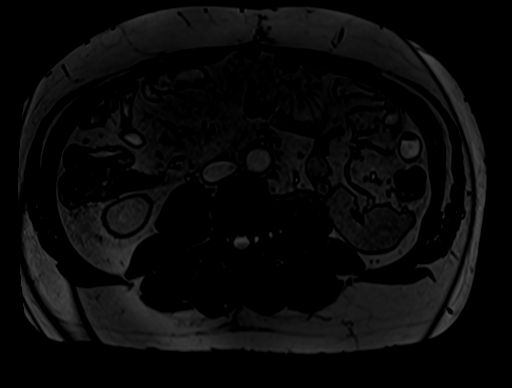
[im 65/65]
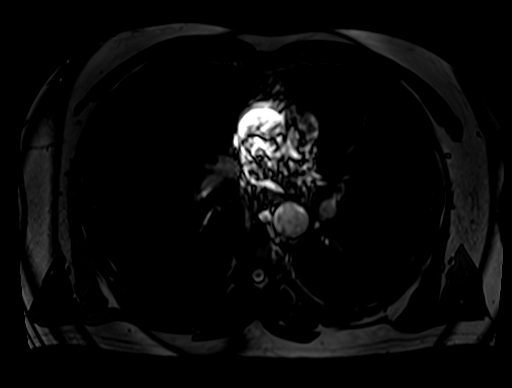

[Series 15: T1 dynamic · axial · 3.0mm · 1.25mm/px · z∈[-8,+253]mm · 3 of 88 slices shown (1 of 11)]
[im 1/88]
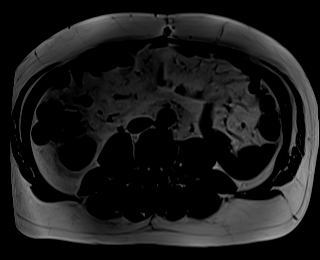
[im 44/88]
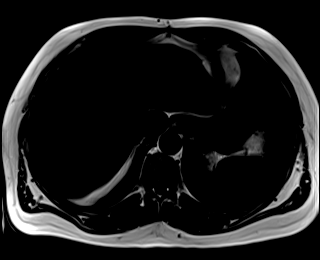
[im 88/88]
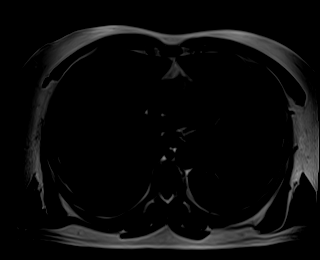

[Series 16: T1 dynamic · axial · 3.0mm · 1.25mm/px · z∈[-8,+253]mm · 3 of 88 slices shown (2 of 11)]
[im 1/88]
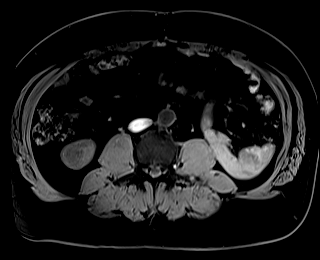
[im 44/88]
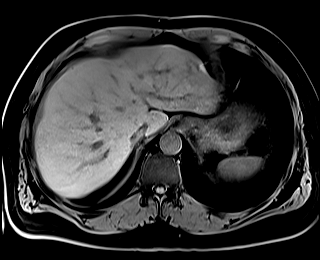
[im 88/88]
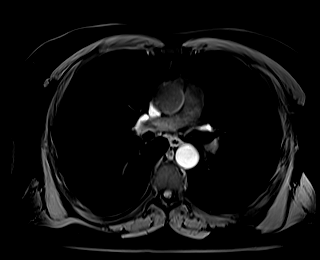

[Series 20: T1 dynamic · axial · 3.0mm · 1.25mm/px · z∈[-8,+253]mm · 3 of 88 slices shown (3 of 11)]
[im 1/88]
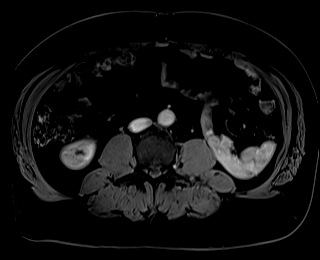
[im 44/88]
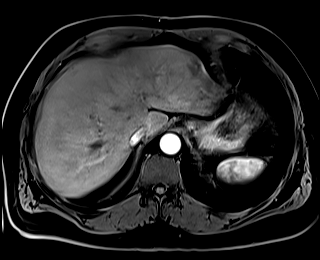
[im 88/88]
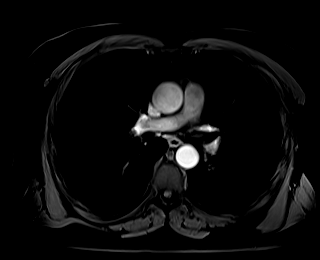

[Series 21: T1 dynamic · axial · 3.0mm · 1.25mm/px · z∈[-8,+253]mm · 3 of 88 slices shown (4 of 11)]
[im 1/88]
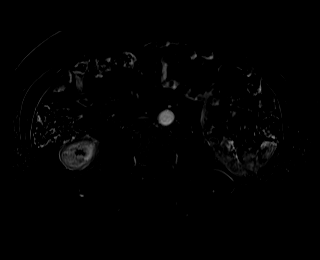
[im 44/88]
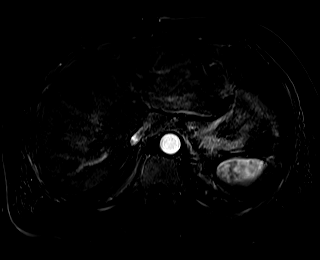
[im 88/88]
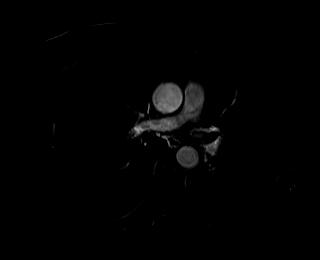

[Series 24: T1 dynamic · axial · 3.0mm · 1.25mm/px · z∈[-8,+253]mm · 3 of 88 slices shown (5 of 11)]
[im 1/88]
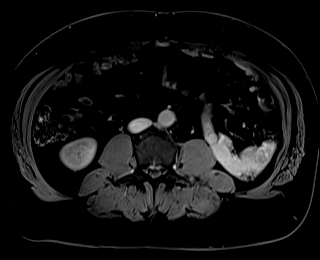
[im 44/88]
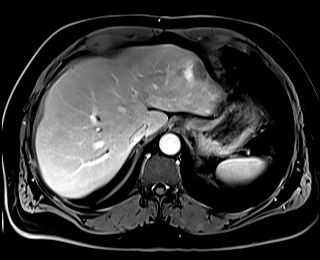
[im 88/88]
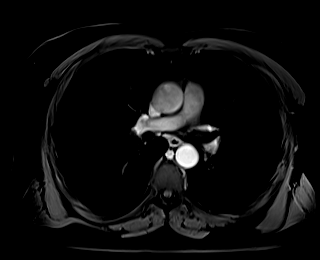

[Series 25: T1 dynamic · axial · 3.0mm · 1.25mm/px · z∈[-8,+253]mm · 3 of 88 slices shown (6 of 11)]
[im 1/88]
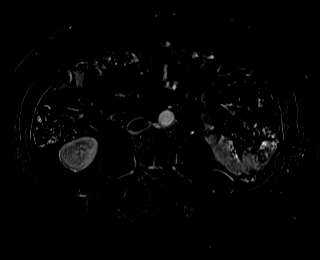
[im 44/88]
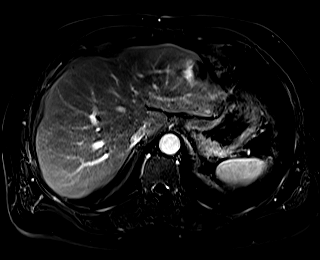
[im 88/88]
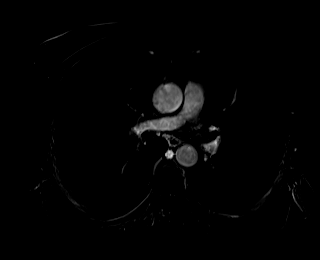

[Series 28: T1 dynamic · axial · 3.0mm · 1.25mm/px · z∈[-8,+253]mm · 3 of 88 slices shown (7 of 11)]
[im 1/88]
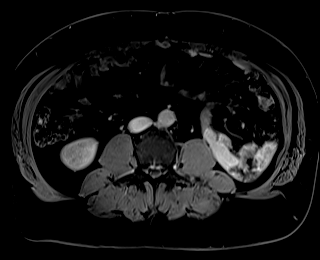
[im 44/88]
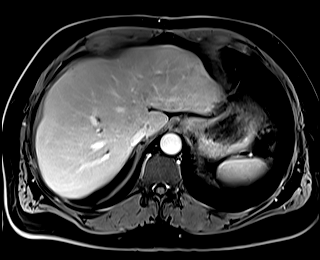
[im 88/88]
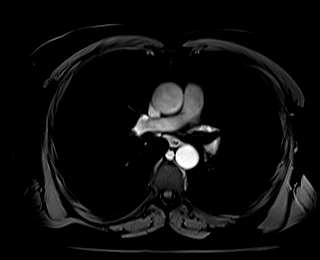

[Series 29: T1 dynamic · axial · 3.0mm · 1.25mm/px · z∈[-8,+253]mm · 3 of 88 slices shown (8 of 11)]
[im 1/88]
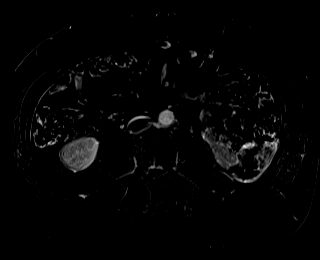
[im 44/88]
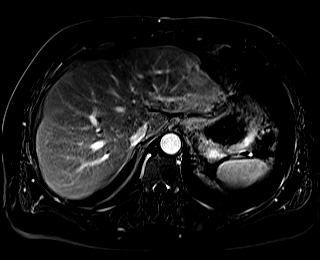
[im 88/88]
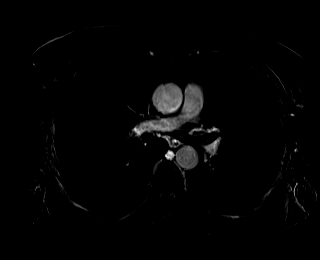

[Series 31: T1 dynamic · coronal · 5.0mm · 1.41mm/px · 2 of 56 slices shown (9 of 11)]
[im 1/56]
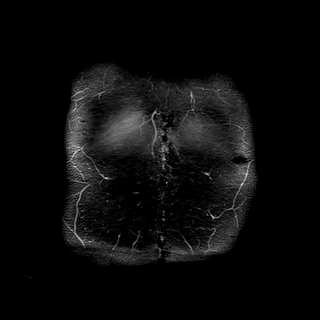
[im 56/56]
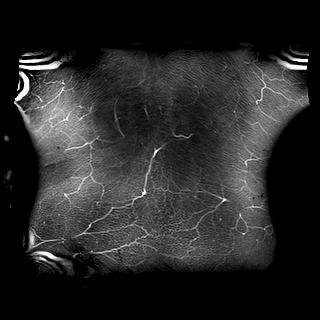

[Series 32: T2 · axial · 6.0mm · 1.56mm/px · 1 of 37 slices shown (3 of 3)]
[im 1/37]
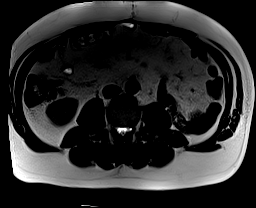

[Series 35: T1 dynamic · axial · 3.0mm · 1.25mm/px · z∈[-8,+253]mm · 3 of 88 slices shown (10 of 11)]
[im 1/88]
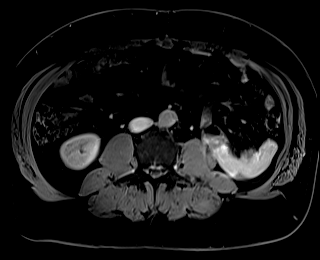
[im 44/88]
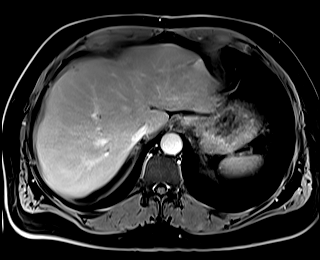
[im 88/88]
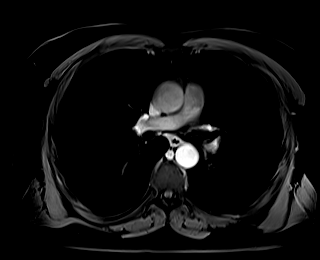

[Series 36: T1 dynamic · axial · 3.0mm · 1.25mm/px · z∈[-8,+253]mm · 3 of 88 slices shown (11 of 11)]
[im 1/88]
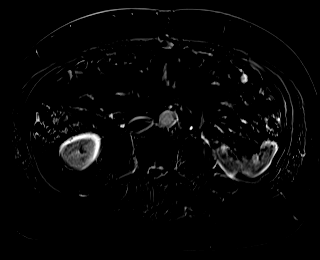
[im 44/88]
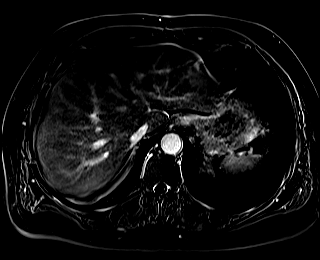
[im 88/88]
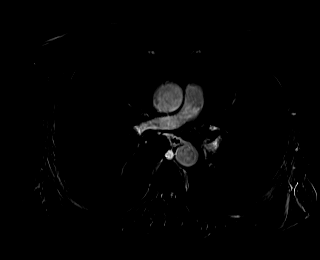

[48 of 48 positions shown; findings below may reference images not displayed]

FINDINGS: COMBINED FINDINGS FOR BOTH MR ABDOMEN AND PELVIS

Lower chest: No acute findings.

Hepatobiliary: Redemonstrated susceptibility artifact at the
anterior and central aspect of the liver dome (series 12, image 35,
series 20, image 35). No suspicious mass or other parenchymal
abnormality identified. Status post cholecystectomy. No biliary
ductal dilatation.

Pancreas: No mass, inflammatory changes, or other parenchymal
abnormality identified. No pancreatic ductal dilatation

Spleen:  Within normal limits in size and appearance.

Adrenals/Urinary Tract: No masses identified. Discoid left adrenal.
Left pelvic kidney with a large, exophytic cyst of the inferior pole
measuring 11.0 cm. No evidence of hydronephrosis.

Stomach/Bowel: Visualized portions within the abdomen are
unremarkable.

Vascular/Lymphatic: No pathologically enlarged lymph nodes
identified. No abdominal aortic aneurysm demonstrated.

Reproductive: Prostate is unremarkable.

Other:  None.

Musculoskeletal: No suspicious bone lesions identified.
IMPRESSION: 1. No evidence of recurrent or metastatic disease in the abdomen or
pelvis. No lymphadenopathy.
2. Redemonstrated susceptibility artifact at the anterior and
central aspect of the liver dome, in keeping with prior partial
right hepatectomy.
3. Left pelvic kidney with a large, exophytic cyst of the inferior
pole measuring 11.0 cm. Although benign, this may be symptomatic due
to large size and abnormal pelvic location.

## 2020-12-30 MED ORDER — GADOBUTROL 1 MMOL/ML IV SOLN
10.0000 mL | Freq: Once | INTRAVENOUS | Status: AC | PRN
  Administered 2020-12-30: 10 mL via INTRAVENOUS

## 2021-01-06 ENCOUNTER — Encounter: Payer: Self-pay | Admitting: Oncology

## 2021-01-06 ENCOUNTER — Other Ambulatory Visit: Payer: Self-pay | Admitting: Oncology

## 2021-01-06 ENCOUNTER — Inpatient Hospital Stay: Payer: BC Managed Care – PPO

## 2021-01-06 ENCOUNTER — Other Ambulatory Visit: Payer: Self-pay | Admitting: Hematology and Oncology

## 2021-01-06 ENCOUNTER — Inpatient Hospital Stay: Payer: BC Managed Care – PPO | Attending: Oncology | Admitting: Oncology

## 2021-01-06 VITALS — BP 135/93 | HR 62 | Temp 97.7°F | Resp 20 | Wt 267.0 lb

## 2021-01-06 DIAGNOSIS — C787 Secondary malignant neoplasm of liver and intrahepatic bile duct: Secondary | ICD-10-CM

## 2021-01-06 DIAGNOSIS — C189 Malignant neoplasm of colon, unspecified: Secondary | ICD-10-CM | POA: Diagnosis not present

## 2021-01-06 DIAGNOSIS — C187 Malignant neoplasm of sigmoid colon: Secondary | ICD-10-CM

## 2021-01-06 DIAGNOSIS — Z85038 Personal history of other malignant neoplasm of large intestine: Secondary | ICD-10-CM | POA: Diagnosis not present

## 2021-01-06 LAB — COMPREHENSIVE METABOLIC PANEL
Albumin: 4.3 (ref 3.5–5.0)
Calcium: 9.1 (ref 8.7–10.7)

## 2021-01-06 LAB — CBC AND DIFFERENTIAL
HCT: 44 (ref 41–53)
Hemoglobin: 15 (ref 13.5–17.5)
Neutrophils Absolute: 3.54
Platelets: 203 (ref 150–399)
WBC: 5.9

## 2021-01-06 LAB — CBC: RBC: 5.03 (ref 3.87–5.11)

## 2021-01-06 LAB — BASIC METABOLIC PANEL
BUN: 14 (ref 4–21)
CO2: 30 — AB (ref 13–22)
Chloride: 101 (ref 99–108)
Creatinine: 1.1 (ref 0.6–1.3)
Glucose: 98
Potassium: 4.2 (ref 3.4–5.3)
Sodium: 138 (ref 137–147)

## 2021-01-06 LAB — HEPATIC FUNCTION PANEL
ALT: 24 (ref 10–40)
AST: 33 (ref 14–40)
Alkaline Phosphatase: 89 (ref 25–125)
Bilirubin, Total: 0.6

## 2021-01-07 ENCOUNTER — Telehealth: Payer: Self-pay | Admitting: Oncology

## 2021-01-07 ENCOUNTER — Telehealth: Payer: Self-pay

## 2021-01-07 LAB — CEA: CEA: 0.8 ng/mL (ref 0.0–4.7)

## 2021-01-07 NOTE — Telephone Encounter (Signed)
9/2/22Spoke to patients wife(Laura Trigo)and confirmed next appt

## 2021-01-07 NOTE — Telephone Encounter (Signed)
Eduardo Hester patients wife notified'

## 2021-01-07 NOTE — Telephone Encounter (Signed)
-----   Message from Derwood Kaplan, MD sent at 01/07/2021  1:07 PM EDT ----- Regarding: call wife Tell wife all labs look good, incl CEA 0.8.  Her # is [phone]

## 2021-07-01 NOTE — Progress Notes (Signed)
[facility]  [address] [place],  [place]  [address] [phone]  Clinic Day:  07/07/2021  Referring physician: Angelina Sheriff, MD   This document serves as a record of services personally performed by [name], MD. It was created on their behalf by [facility], a trained medical scribe. The creation of this record is based on the scribe's personal observations and the provider's statements to them.  CHIEF COMPLAINT:  CC: History of stage IIIB sigmoid colon cancer  Current Treatment:  Surveillance   HISTORY OF PRESENT ILLNESS:  Eduardo Hester is a 51 y.o. 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  He presented with fevers and chills in September 2017 and was found to have diverticulitis, which was treated.  CT at that time revealed a 1.7 cm lesion in the liver, which was nonspecific, as well as ectopic left kidney with a cyst.  He was then brought back for a colonoscopy, which revealed a 3.5 cm mass in the sigmoid.  Biopsy revealed high-grade dysplasia.  CT abdomen and pelvis revealed an indeterminate lesion in the liver in addition to wall thickening in the sigmoid colon.  MRI abdomen revealed the lesion in the liver to represent complex cyst versus vascular lesion.  Air contrast barium enema revealed an apple-core lesion of the sigmoid colon.  Baseline CEA was 0.6.  He underwent surgical resection in November 2017.  Pathology revealed a 4 cm, grade 2, adenocarcinoma of the sigmoid colon, as well as chronic active diverticulitis with an area of perforation, however, the tumor was not perforated.  Tumor invaded through the muscularis propria into the pericolorectal tissues.  4/21 nodes were positive for metastasis.  Margins were clear.  MMR and MSI were normal.  KRAS and BRAF mutations were negative.  He had iron deficiency treated with oral iron supplement in the form of Hemocyte daily.  Due to his age of diagnosis  and family history he underwent testing for hereditary non polyposis colorectal cancer with the My[employer] myRisk Hereditary Cancer Panel test.  This did not reveal any clinically significant mutation or variants of uncertain significance.  Repeat MRI abdomen in January 2018 revealed a stable lesion within the right lobe of the liver, most consistent with benign lesion.  There was new small volume ascites seen.  The patient received adjuvant chemotherapy with FOLFOX  for 12 cycles, which was completed in June.  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  He was seen for routine follow-up in September 2018 with a repeat MRI abdomen to re-evaluate the liver lesion.  That revealed a large volume ascites with findings suspicious for peritoneal disease.  The lesion in the right lobe of the liver was stable and still assistant with a benign lesion.  The patient underwent paracentesis with removal of 4.7 L of fluid, but fluid cytology was negative for malignancy.  PET scan in early October did not reveal any hypermetabolic activity, but moderate ascites was seen.  Ultrasound-guided peritoneal biopsy was negative for malignancy, revealing adipose tissue with fibrosis, patchy inflammation and minimal atypia.  Dr. Noberto Retort performed colonoscopy in October 2018, which was negative.  The patient then underwent exploratory laparotomy with multiple peritoneal biopsies.  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  We therefore recommended observation for the patient.  In November 2018, gallbladder ultrasound revealed cholelithiasis with sludge and polyps, as well as mild gallbladder wall thickening.  Mild ascites was also seen.  CT abdomen and pelvis revealed mild diffuse gallbladder wall edema otherwise unchanged from PET-CT done in October.  He  underwent cholecystectomy with findings of acute cholecystitis.  Pathology did not reveal any evidence of malignancy.    In April 2019, as he was found to have abdominal pain and an elevated bilirubin and liver transaminases.  Due to the laboratory abnormalities, he underwent repeat MRI abdomen at that time, which revealed a stable lesion in the right hepatic lobe, which was felt to be indeterminate, with a 2nd smaller indeterminate lesion in the right hepatic lobe measuring 12 mm, which was new from previous imaging.  CEA was normal.  Hepatitis panel and CMV were negative  we felt the laboratory abnormalities were most likely secondary to viral illness.  He was followed closely with multiple MRI scans.  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  MRI abdomen from January 11th 2021 revealed the mass in the junction of segments 7 and 8 in the liver has increased in size compared to the prior exam, currently 2.9 x 2.1 x 2.5 cm.  The lesion has a branching component, some of which may be from portal vein thrombus or tumor thrombus, even this branching component appears to enhance on subtraction images.  CEA was 1.2.  PET scan from January 2021 revealed the liver mass in the junction of segments 7 and 8 to be mildly hypermetabolic with a max SUV of 4.3 compared to typical background liver SUV of 3.5.  Biopsy was pursued on February 2nd, and this is an adenocarcinoma with necrosis, consistent with metastasis from colon primary.  He was referred to Dr. Rolla Etienne with [facility] and had liver resection on February 24th.  We discussed the option of further chemotherapy, including the risks and benefits, and he opted for surveillance. MRI abdomen and pelvis from August 2022  revealed no evidence of recurrent or metastatic disease in the abdomen or pelvis. No lymphadenopathy. Re-demonstrated susceptibility artifact at the anterior and central aspect of the liver dome, in keeping with prior partial right hepatectomy. Left pelvic kidney with a large, exophytic cyst of the inferior pole measuring 11.0 cm.   INTERVAL HISTORY:  Eduardo Hester is here for routine follow up and states that he continues to do well and denies complaints other than constipation. This is relieved with medication. His  appetite is good, and he has gained 15 pounds since his last visit.  He denies fever, chills or other signs of infection.  He denies nausea, vomiting, bowel issues, or abdominal pain.  He denies sore throat, cough, dyspnea, or chest pain.  REVIEW OF SYSTEMS:  Review of Systems  Constitutional: Negative.  Negative for appetite change, chills, fatigue, fever and unexpected weight change.  HENT:  Negative.    Eyes: Negative.   Respiratory: Negative.  Negative for chest tightness, cough, hemoptysis, shortness of breath and wheezing.   Cardiovascular: Negative.  Negative for chest pain, leg swelling and palpitations.  Gastrointestinal:  Positive for constipation. Negative for abdominal distention, abdominal pain, blood in stool, diarrhea, nausea and vomiting.  Endocrine: Negative.   Genitourinary:  Negative.  Negative for difficulty urinating, dysuria, frequency and hematuria.   Musculoskeletal: Negative.  Negative for arthralgias, back pain, flank pain, gait problem and myalgias.  Skin: Negative.   Neurological: Negative.  Negative for dizziness, extremity weakness, gait problem, headaches, light-headedness, numbness, seizures and speech difficulty.  Hematological: Negative.   Psychiatric/Behavioral: Negative.  Negative for depression and sleep disturbance. The patient is not nervous/anxious.   All other systems reviewed and are negative.   VITALS:  Blood pressure (!) 153/99, pulse 74,  temperature 97.7 F (36.5 C), temperature source Oral, resp. rate 20, height 6' 6"  (1.981 m), weight 282 lb 3.2 oz (128 kg), SpO2 98 %.  Wt Readings from Last 3 Encounters:  07/07/21 282 lb 3.2 oz (128 kg)  01/06/21 267 lb (121.1 kg)  06/11/20 266 lb 12.8 oz (121 kg)    Body mass index is 32.61 kg/m.  Performance status (ECOG): 0 - Asymptomatic  PHYSICAL EXAM:  Physical Exam Constitutional:      General: He is not in acute distress.    Appearance: Normal appearance. He is normal weight.  HENT:     Head: Normocephalic and atraumatic.  Eyes:     General: No scleral icterus.    Extraocular Movements: Extraocular movements intact.     Conjunctiva/sclera: Conjunctivae normal.     Pupils: Pupils are equal, round, and reactive to light.  Cardiovascular:     Rate and Rhythm: Normal rate and regular rhythm.     Pulses: Normal pulses.     Heart sounds: Normal heart sounds. No murmur heard.   No friction rub. No gallop.  Pulmonary:     Effort: Pulmonary effort is normal. No respiratory distress.     Breath sounds: Normal breath sounds.  Abdominal:     General: Bowel sounds are normal. There is no distension.     Palpations: Abdomen is soft. There is no hepatomegaly, splenomegaly or mass.     Tenderness: There is no abdominal tenderness.     Comments: Thick well healed scar of the mid upper abdomen.  Musculoskeletal:        General: Normal range of motion.     Cervical back: Normal range of motion and neck supple.     Right lower leg: No edema.     Left lower leg: No edema.  Lymphadenopathy:     Cervical: No cervical adenopathy.  Skin:    General: Skin is warm and dry.  Neurological:     General: No focal deficit present.     Mental Status: He is alert and oriented to person, place, and time. Mental status is at baseline.  Psychiatric:        Mood and Affect: Mood normal.        Behavior: Behavior normal.        Thought Content: Thought content normal.        Judgment:  Judgment normal.    LABS:   CBC Latest Ref Rng & Units 07/07/2021 01/06/2021 06/11/2020  WBC - 7.3 5.9 5.1  Hemoglobin 13.5 - 17.5 15.7 15.0 14.8  Hematocrit 41 - 53 47 44 44  Platelets 150 - 399 225 203 276   CMP Latest Ref Rng & Units 07/07/2021 01/06/2021 06/11/2020  BUN 4 - 21 14 14 10   Creatinine 0.6 - 1.3 1.0 1.1 1.0  Sodium 137 - 147 140 138 141  Potassium 3.4 - 5.3 3.9 4.2 4.0  Chloride 99 - 108 108 101 105  CO2 13 - 22 25(A) 30(A)  29(A)  Calcium 8.7 - 10.7 8.6(A) 9.1 8.6(A)  Alkaline Phos 25 - 125 95 89 78  AST 14 - 40 35 33 32  ALT 10 - 40 28 24 24      Lab Results  Component Value Date   CEA1 0.8 01/06/2021   /  CEA  Date Value Ref Range Status  01/06/2021 0.8 0.0 - 4.7 ng/mL Final    Comment:    (NOTE)                             Nonsmokers          <3.9                             Smokers             <5.6 [employer] Electrochemiluminescence Immunoassay (ECLIA) Values obtained with different assay methods or kits cannot be used interchangeably.  Results cannot be interpreted as absolute evidence of the presence or absence of malignant disease. Performed At: [facility] [place], [place] [id number] Rush Farmer MD [id number]      STUDIES:  No results found.    Allergies: No Known Allergies  Current Medications: Current Outpatient Medications  Medication Sig Dispense Refill   ascorbic acid (VITAMIN C) 1000 MG tablet Take by mouth.     Calcium Polycarbophil (FIBER) 625 MG TABS Take by mouth.     cetirizine (ZYRTEC) 10 MG tablet Take by mouth.     Cholecalciferol (VITAMIN D) 50 MCG (2000 UT) tablet Take by mouth.     Multiple Vitamin (MULTI-VITAMIN) tablet Take 1 tablet by mouth daily.     omeprazole (PRILOSEC) 20 MG capsule Take by mouth.     vitamin B-12 (CYANOCOBALAMIN) 250 MCG tablet Take by mouth.     zinc gluconate 50 MG tablet Take 50 mg by mouth daily.     No current facility-administered medications for this  visit.     ASSESSMENT & PLAN:   Assessment:   1. 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  2. He had ascites and peritoneal nodules in 2018 with negative biopsy and negative fluid cytology.  He was taken for exploratory laparotomy with removal of the nodule and this was confirmed to be benign.  3. Liver metastasis confirmed in January of 2021, which was resected in February, and was larger than it appeared on imaging, at 4.7 cm at the time of resection.  The margins are close but seem to be clear.  One margin is uncertain and another is less than 1 mm on the anterior aspect.  We discussed the risks and benefits of chemotherapy versus observation and decided to go with surveillance at this time.  4. 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    5.  COVID pneumonia, mild and resolved.    6.  Large left renal cyst measuring 11 cm, but appears benign.  We will continue to monitor this.  Plan: We will plan to obtain repeat MRI abdomen and pelvis in May and I will contact him with the results. Otherwise, as he continues to do well, we will see him back in 6 months with CBC, CMP, CEA for reevaluation. They understand and agree with this plan of care.   I provided 15 minutes of  face-to-face time during this this encounter and > 50% was spent counseling as documented under my assessment and plan.    Derwood Kaplan, MD [facility] AT [facility] [address] [place] [address] Dept: [phone] Dept Fax: [phone]   I, Rita Ohara, am acting as scribe for Derwood Kaplan, MD  I have reviewed this report as typed by the medical scribe, and it is complete and accurate.

## 2021-07-07 ENCOUNTER — Other Ambulatory Visit: Payer: Self-pay

## 2021-07-07 ENCOUNTER — Encounter: Payer: Self-pay | Admitting: Oncology

## 2021-07-07 ENCOUNTER — Inpatient Hospital Stay: Payer: BC Managed Care – PPO | Attending: Oncology | Admitting: Oncology

## 2021-07-07 ENCOUNTER — Other Ambulatory Visit: Payer: Self-pay | Admitting: Oncology

## 2021-07-07 ENCOUNTER — Inpatient Hospital Stay: Payer: BC Managed Care – PPO

## 2021-07-07 ENCOUNTER — Telehealth: Payer: Self-pay | Admitting: Oncology

## 2021-07-07 VITALS — BP 153/99 | HR 74 | Temp 97.7°F | Resp 20 | Ht 78.0 in | Wt 282.2 lb

## 2021-07-07 DIAGNOSIS — C787 Secondary malignant neoplasm of liver and intrahepatic bile duct: Secondary | ICD-10-CM

## 2021-07-07 DIAGNOSIS — Q632 Ectopic kidney: Secondary | ICD-10-CM | POA: Insufficient documentation

## 2021-07-07 DIAGNOSIS — Z85038 Personal history of other malignant neoplasm of large intestine: Secondary | ICD-10-CM | POA: Diagnosis not present

## 2021-07-07 DIAGNOSIS — Z9221 Personal history of antineoplastic chemotherapy: Secondary | ICD-10-CM | POA: Diagnosis not present

## 2021-07-07 DIAGNOSIS — C187 Malignant neoplasm of sigmoid colon: Secondary | ICD-10-CM

## 2021-07-07 DIAGNOSIS — D500 Iron deficiency anemia secondary to blood loss (chronic): Secondary | ICD-10-CM

## 2021-07-07 LAB — BASIC METABOLIC PANEL
BUN: 14 (ref 4–21)
CO2: 25 — AB (ref 13–22)
Chloride: 108 (ref 99–108)
Creatinine: 1 (ref 0.6–1.3)
Glucose: 99
Potassium: 3.9 (ref 3.4–5.3)
Sodium: 140 (ref 137–147)

## 2021-07-07 LAB — COMPREHENSIVE METABOLIC PANEL
Albumin: 4.2 (ref 3.5–5.0)
Calcium: 8.6 — AB (ref 8.7–10.7)

## 2021-07-07 LAB — HEPATIC FUNCTION PANEL
ALT: 28 (ref 10–40)
AST: 35 (ref 14–40)
Alkaline Phosphatase: 95 (ref 25–125)
Bilirubin, Total: 0.7

## 2021-07-07 LAB — CBC: RBC: 5.36 — AB (ref 3.87–5.11)

## 2021-07-07 LAB — CBC AND DIFFERENTIAL
HCT: 47 (ref 41–53)
Hemoglobin: 15.7 (ref 13.5–17.5)
Neutrophils Absolute: 4.6
Platelets: 225 (ref 150–399)
WBC: 7.3

## 2021-07-07 NOTE — Telephone Encounter (Signed)
Per 07/07/21 los next appt scheduled and confirmed with patient ?

## 2021-07-08 LAB — CEA: CEA: 0.9 ng/mL (ref 0.0–4.7)

## 2021-07-15 ENCOUNTER — Encounter: Payer: Self-pay | Admitting: Oncology

## 2021-09-19 ENCOUNTER — Ambulatory Visit (HOSPITAL_COMMUNITY): Payer: BC Managed Care – PPO

## 2021-09-29 ENCOUNTER — Ambulatory Visit (HOSPITAL_COMMUNITY)
Admission: RE | Admit: 2021-09-29 | Discharge: 2021-09-29 | Disposition: A | Discharge status: Home / Self Care | Payer: BC Managed Care – PPO | Source: Ambulatory Visit | Attending: Oncology | Admitting: Oncology

## 2021-09-29 DIAGNOSIS — C787 Secondary malignant neoplasm of liver and intrahepatic bile duct: Secondary | ICD-10-CM | POA: Diagnosis not present

## 2021-09-29 DIAGNOSIS — Z85038 Personal history of other malignant neoplasm of large intestine: Secondary | ICD-10-CM | POA: Diagnosis not present

## 2021-09-29 DIAGNOSIS — Z8505 Personal history of malignant neoplasm of liver: Secondary | ICD-10-CM | POA: Diagnosis not present

## 2021-09-29 DIAGNOSIS — R160 Hepatomegaly, not elsewhere classified: Secondary | ICD-10-CM | POA: Diagnosis not present

## 2021-09-29 IMAGING — MR MR ABDOMEN WO/W CM
16 of 17 series · 47 of 48 positions shown · IV contrast (gadavist)
Comparison: Abdominal MRI [DATE].

CLINICAL DATA: 50-year-old male with history of colon cancer with
metastatic disease to the liver status post hepatic resection.
Follow-up study.

EXAM:
MRI ABDOMEN WITHOUT AND WITH CONTRAST
TECHNIQUE: Multiplanar multisequence MR imaging of the abdomen was performed
both before and after the administration of intravenous contrast.
CONTRAST:  10mL GADAVIST GADOBUTROL 1 MMOL/ML IV SOLN

[Series 3: T2 · coronal · 6.0mm · 1.56mm/px · 1 of 35 slices shown (1 of 2)]
[im 1/35]
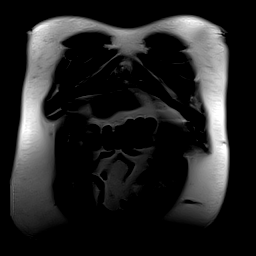

[Series 4: T2 fat-sat · 1 of 6 slices shown (1 of 2)]
[im 1/6]
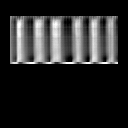

[Series 5: T2 fat-sat · axial · 6.0mm · 1.25mm/px · 1 of 36 slices shown (2 of 2)]
[im 1/36]
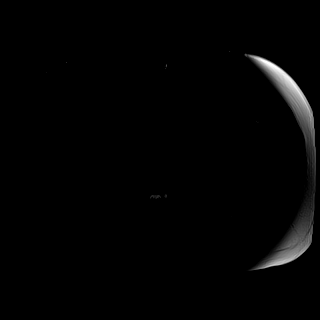

[Series 6: T1 · axial · 3.0mm · 1.25mm/px · z∈[-137,+100]mm · 3 of 80 slices shown (1 of 2)]
[im 1/80]
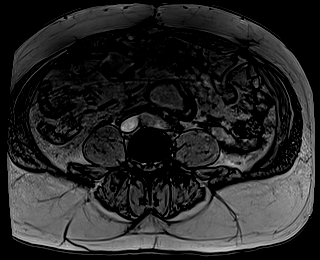
[im 40/80]
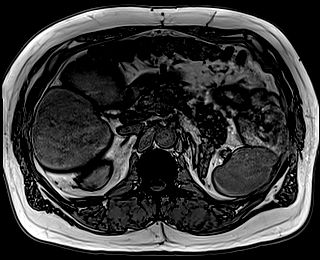
[im 80/80]
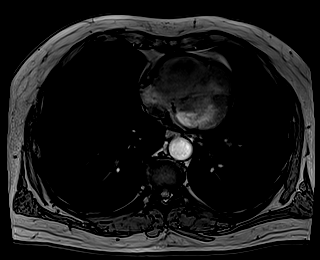

[Series 7: T1 · axial · 3.0mm · 1.25mm/px · z∈[-137,+100]mm · 4 of 80 slices shown (2 of 2)]
[im 1/80]
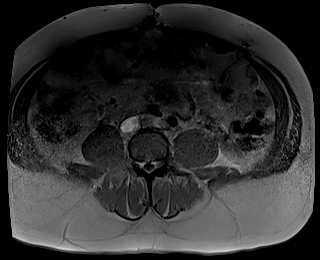
[im 27/80]
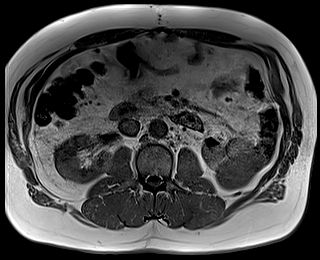
[im 53/80]
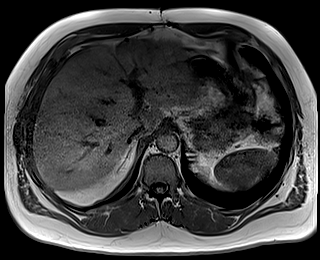
[im 80/80]
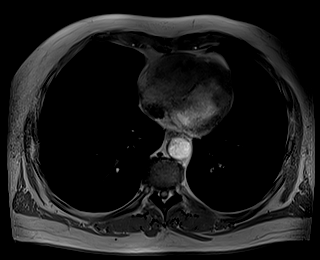

[Series 8: DWI · axial · 6.0mm · 1.49mm/px · z∈[-88,+164]mm · 3 of 72 slices shown (1 of 2)]
[im 1/72]
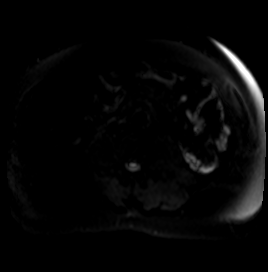
[im 36/72]
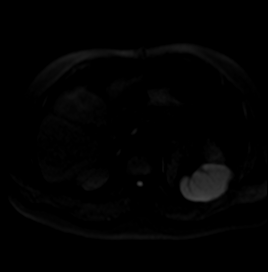
[im 72/72]
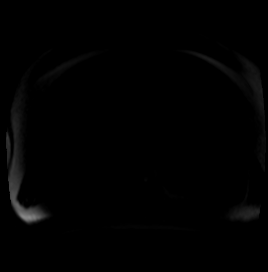

[Series 9: DWI · axial · 6.0mm · 1.49mm/px · z∈[-88,+164]mm · 2 of 36 slices shown (2 of 2)]
[im 1/36]
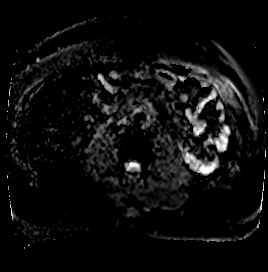
[im 36/36]
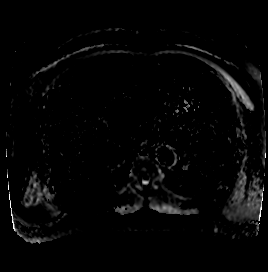

[Series 10: bSSFP · axial · 4.0mm · 0.84mm/px · z∈[-71,+165]mm · 3 of 60 slices shown]
[im 1/60]
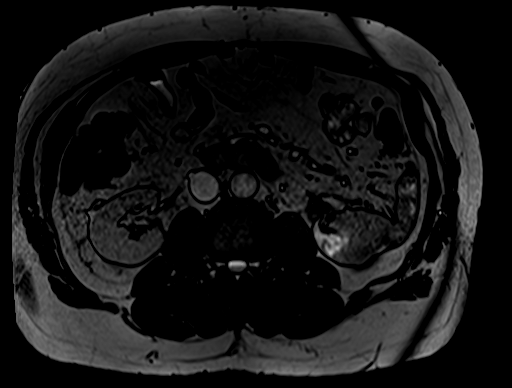
[im 30/60]
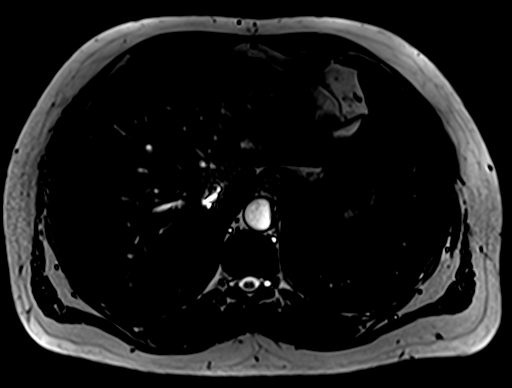
[im 60/60]
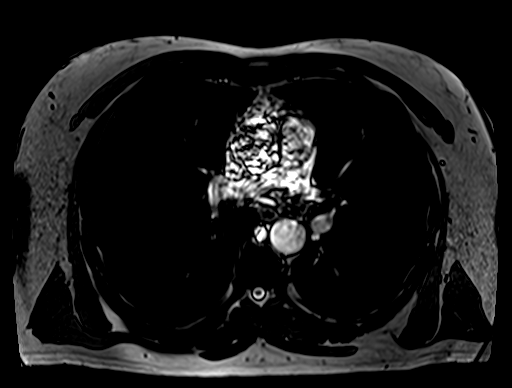

[Series 11: T1 dynamic · axial · 3.0mm · 1.25mm/px · z∈[-124,+113]mm · 4 of 80 slices shown (1 of 7)]
[im 1/80]
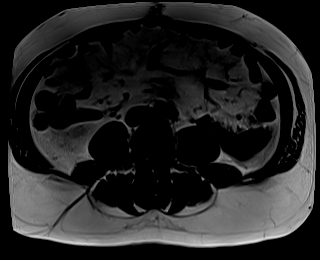
[im 27/80]
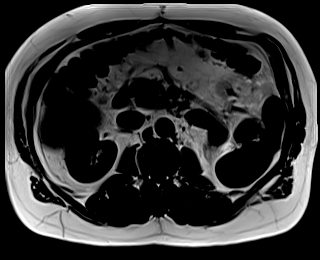
[im 53/80]
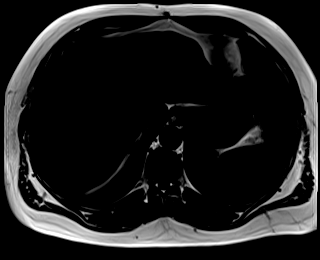
[im 80/80]
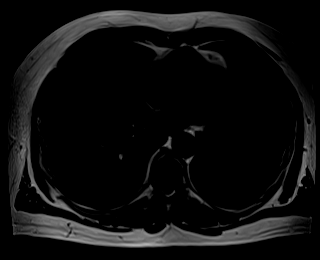

[Series 12: T1 dynamic · axial · 3.0mm · 1.25mm/px · z∈[-124,+113]mm · 4 of 80 slices shown (2 of 7)]
[im 1/80]
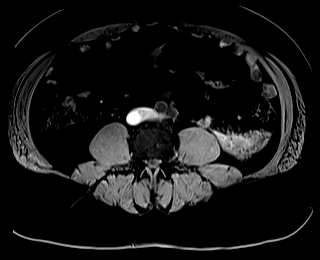
[im 27/80]
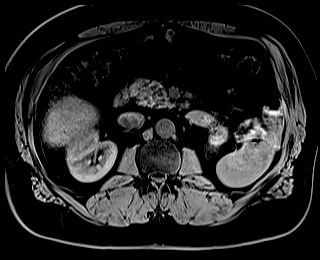
[im 53/80]
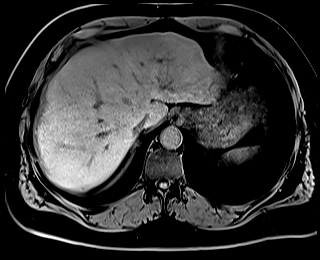
[im 80/80]
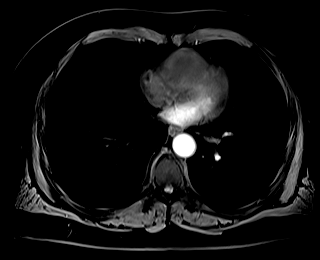

[Series 15: T1 dynamic · axial · 3.0mm · 1.25mm/px · z∈[-124,+113]mm · 4 of 80 slices shown (3 of 7)]
[im 1/80]
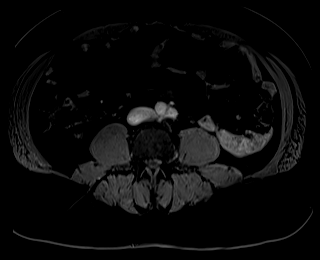
[im 27/80]
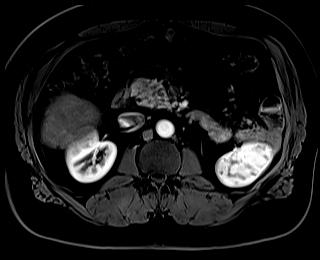
[im 53/80]
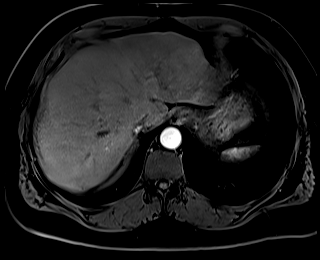
[im 80/80]
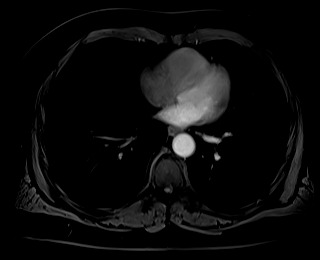

[Series 17: T1 dynamic · axial · 3.0mm · 1.25mm/px · z∈[-124,+113]mm · 4 of 80 slices shown (4 of 7)]
[im 1/80]
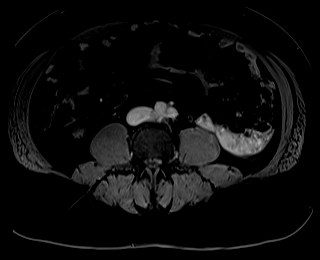
[im 27/80]
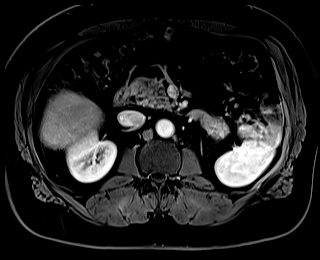
[im 53/80]
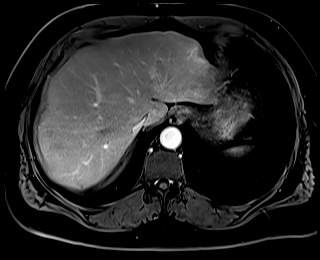
[im 80/80]
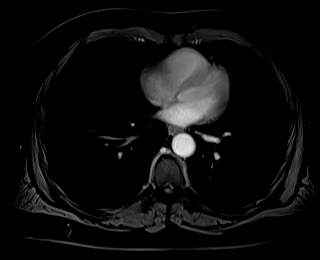

[Series 19: T1 dynamic · axial · 3.0mm · 1.25mm/px · z∈[-124,+113]mm · 4 of 80 slices shown (5 of 7)]
[im 1/80]
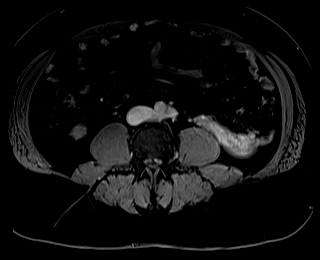
[im 27/80]
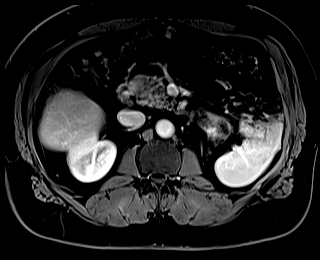
[im 53/80]
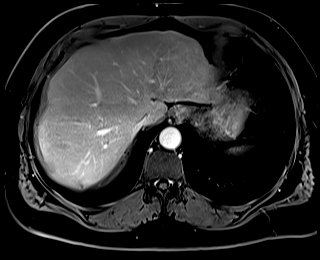
[im 80/80]
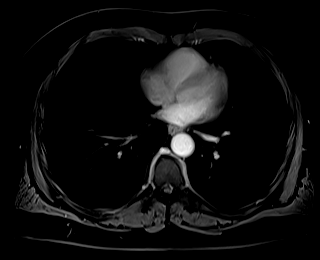

[Series 21: T1 dynamic · coronal · 3.0mm · 1.41mm/px · 4 of 80 slices shown (6 of 7)]
[im 1/80]
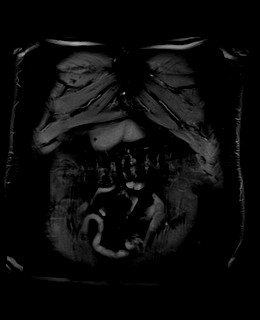
[im 27/80]
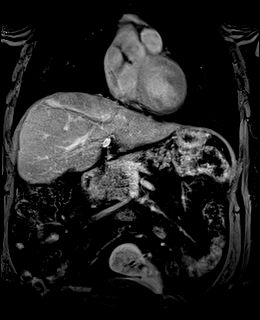
[im 53/80]
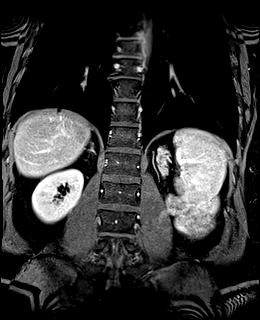
[im 80/80]
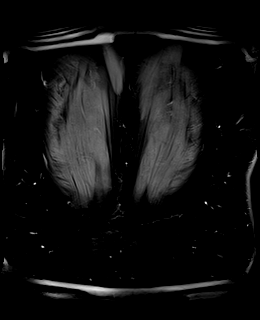

[Series 22: T2 · axial · 6.0mm · 1.56mm/px · 1 of 30 slices shown (2 of 2)]
[im 1/30]
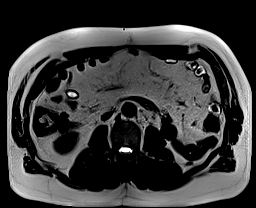

[Series 24: T1 dynamic · axial · 3.0mm · 1.25mm/px · z∈[-124,+113]mm · 4 of 80 slices shown (7 of 7)]
[im 1/80]
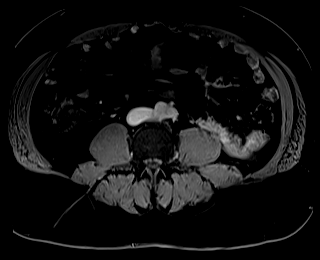
[im 27/80]
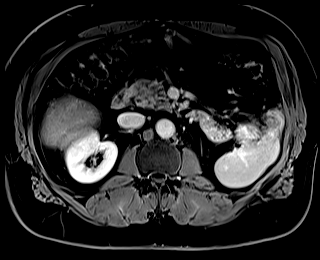
[im 53/80]
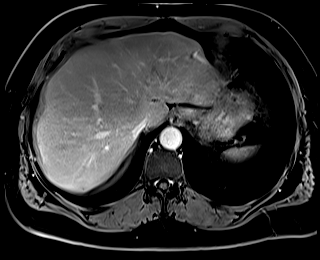
[im 80/80]
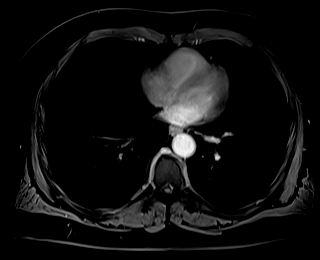

[47 of 48 positions shown; findings below may reference images not displayed]

FINDINGS: Lower chest: Unremarkable.

Hepatobiliary: Mild diffuse loss of signal intensity throughout the
hepatic parenchyma, indicative of a background of mild hepatic
steatosis. Again noted are areas of susceptibility artifact in the
superior aspect of the right lobe of the liver related to prior
partial hepatectomy. Today's study demonstrates a conspicuous area
of hypovascular mass-like enhancement on post gadolinium [REDACTED]ed in segment 8 of the liver, best appreciated on axial image
20 of series 17 and coronal image 40 of series 21, currently
measuring 2.2 x 2.5 x 2.7 cm, highly concerning for recurrent
neoplasm at the site of prior surgical resection. No other definite
suspicious hepatic lesions are noted. No intra or extrahepatic
biliary ductal dilatation. Status post cholecystectomy. Common bile
duct measures 4 mm in the porta hepatis.

Pancreas: No pancreatic mass. No pancreatic ductal dilatation. No
pancreatic or peripancreatic fluid collections or inflammatory
changes.

Spleen:  Unremarkable.

Adrenals/Urinary Tract: Left kidney not visualized (previous imaging
demonstrates a left pelvic kidney). Right kidney and bilateral
adrenal glands are normal in appearance. No right
hydroureteronephrosis in the visualized portions of the abdomen.

Stomach/Bowel: Visualized portions are unremarkable.

Vascular/Lymphatic: No aneurysm identified in the visualized
abdominal vasculature. No lymphadenopathy noted in the abdomen.

Other: No significant volume of ascites noted in the visualized
portions of the peritoneal cavity.

Musculoskeletal: No aggressive appearing osseous lesions are noted
in the visualized portions of the skeleton.
IMPRESSION: 1. Findings are highly concerning for locally recurrent metastatic
disease in the liver adjacent to the prior surgical resection site,
as above. No new sites of metastatic disease are otherwise noted.

These results will be called to the ordering clinician or
representative by the Radiologist Assistant, and communication
documented in the PACS or [REDACTED].

## 2021-09-29 MED ORDER — GADOBUTROL 1 MMOL/ML IV SOLN
10.0000 mL | Freq: Once | INTRAVENOUS | Status: AC | PRN
  Administered 2021-09-29: 10 mL via INTRAVENOUS

## 2021-09-30 ENCOUNTER — Telehealth: Payer: Self-pay

## 2021-09-30 NOTE — Telephone Encounter (Signed)
Pt's wife, Mickel Baas, asked that you call her at work number [phone]) with results.  Above message sent to Dr Hinton Rao @ [phone].

## 2021-10-02 ENCOUNTER — Other Ambulatory Visit: Payer: Self-pay | Admitting: Oncology

## 2021-10-02 DIAGNOSIS — C187 Malignant neoplasm of sigmoid colon: Secondary | ICD-10-CM

## 2021-10-04 ENCOUNTER — Telehealth: Payer: Self-pay | Admitting: Oncology

## 2021-10-04 ENCOUNTER — Other Ambulatory Visit: Payer: Self-pay

## 2021-10-04 DIAGNOSIS — C187 Malignant neoplasm of sigmoid colon: Secondary | ICD-10-CM

## 2021-10-04 DIAGNOSIS — D500 Iron deficiency anemia secondary to blood loss (chronic): Secondary | ICD-10-CM

## 2021-10-04 NOTE — Telephone Encounter (Signed)
PET has been scheduled for 10/07/21 @ 12:30pm ; Check in at 12 pm  Notified pt of date,time and instructions.

## 2021-10-04 NOTE — Telephone Encounter (Signed)
Faxed referral to Dr. Merlyn Lot office on 10/04/21. Butch Penny is working on PET scan authorization at this time.

## 2021-10-04 NOTE — Telephone Encounter (Signed)
-----   Message from Derwood Kaplan, MD sent at 10/02/2021  5:07 PM EDT ----- Regarding: MRI Could not find MRI report Friday night.  Wife called me Sunday and I tracked it down.  She tells me she got an email about it.  The report says they will call the ordering clinician but I never rec'd a call.  So it looks like he may have recurrence in the liver, adjacent to the prior surgery but no other areas. Pls refer back to Dr. Reesa Chew at [facility] ASAP, should be straightforward since she did his prior surgery.  I want her opinion as to resectability and suggested a PET scan to rule out other sites of metastatic disease.    Any chance we could get PET here 5/31??  We haven't done a bx yet but will be glad to arrange if she recommends it.   I told wife we could pursue interventional radiology if she doesn't rec surgery, but need to make sure it is not elsewhere

## 2021-10-05 ENCOUNTER — Telehealth: Payer: Self-pay

## 2021-10-05 NOTE — Telephone Encounter (Signed)
Attempted to return patients wifes call regarding [facility] surgeon appointment. No answer and VM is full/

## 2021-10-07 ENCOUNTER — Telehealth: Payer: Self-pay

## 2021-10-07 ENCOUNTER — Ambulatory Visit (HOSPITAL_COMMUNITY)
Admission: RE | Admit: 2021-10-07 | Discharge: 2021-10-07 | Disposition: A | Discharge status: Home / Self Care | Payer: BC Managed Care – PPO | Source: Ambulatory Visit | Attending: Oncology | Admitting: Oncology

## 2021-10-07 DIAGNOSIS — N281 Cyst of kidney, acquired: Secondary | ICD-10-CM | POA: Diagnosis not present

## 2021-10-07 DIAGNOSIS — K7689 Other specified diseases of liver: Secondary | ICD-10-CM | POA: Diagnosis not present

## 2021-10-07 DIAGNOSIS — C189 Malignant neoplasm of colon, unspecified: Secondary | ICD-10-CM | POA: Diagnosis not present

## 2021-10-07 DIAGNOSIS — C187 Malignant neoplasm of sigmoid colon: Secondary | ICD-10-CM | POA: Diagnosis not present

## 2021-10-07 DIAGNOSIS — J8410 Pulmonary fibrosis, unspecified: Secondary | ICD-10-CM | POA: Diagnosis not present

## 2021-10-07 LAB — GLUCOSE, CAPILLARY: Glucose-Capillary: 110 mg/dL — ABNORMAL HIGH (ref 70–99)

## 2021-10-07 IMAGING — CT NM PET TUM IMG INITIAL (PI) SKULL BASE T - THIGH
7 series · 25 of 25 positions shown · non-contrast
Comparison: MRI [DATE] and PET-CT [DATE]. Calcified
granuloma identified in the right middle lobe, image 44/7.

CLINICAL DATA: Subsequent treatment strategy for colon cancer.

EXAM:
NUCLEAR MEDICINE PET SKULL BASE TO THIGH
TECHNIQUE: 14.0 mCi F-18 FDG was injected intravenously. Full-ring PET imaging
was performed from the skull base to thigh after the radiotracer. CT
data was obtained and used for attenuation correction and anatomic
localization.
Fasting blood glucose: 110 mg/dl

[Series 3: pet sk_thigh ac · axial · 5.0mm · 4.07mm/px · z∈[-1106,-38]mm · 5 of 268 slices shown]
[im 1/268]
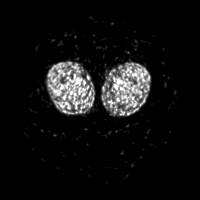
[im 67/268]
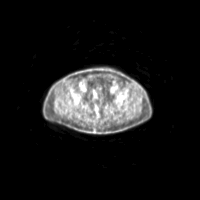
[im 134/268]
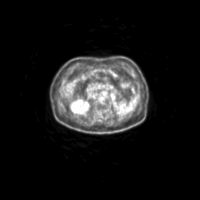
[im 201/268]
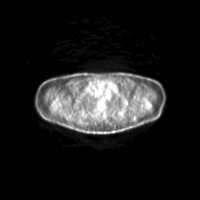
[im 268/268]
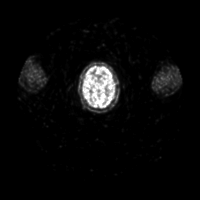

[Series 4: ct sk_thigh 5.0 br38 · axial · 5.0mm · 0.98mm/px · z∈[-1106,-38]mm · 6 of 268 slices shown]
[im 1/268]
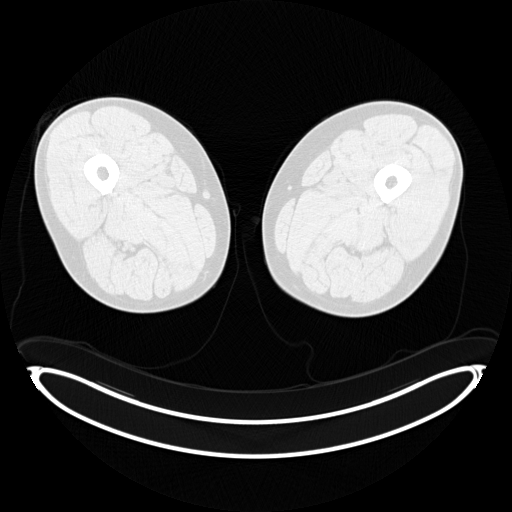
[im 54/268]
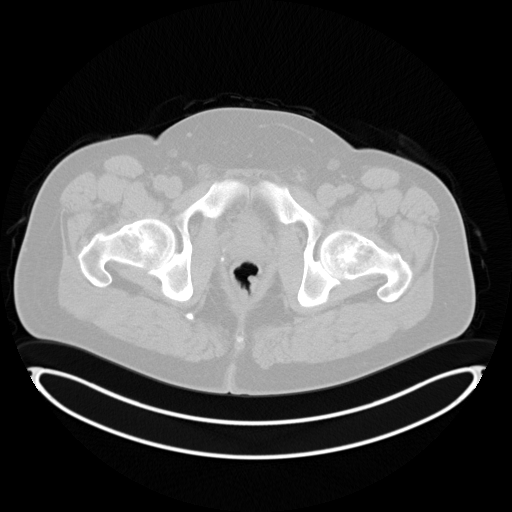
[im 107/268]
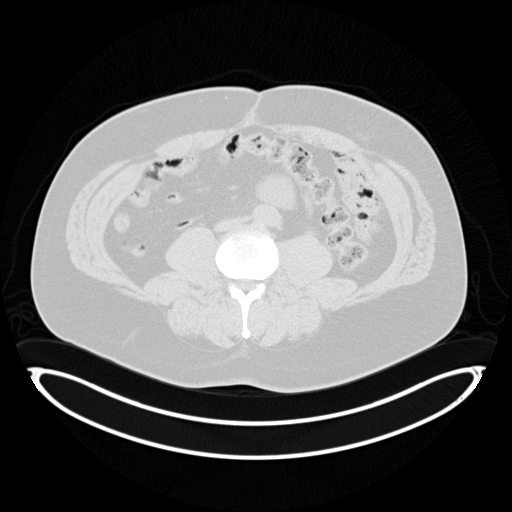
[im 161/268]
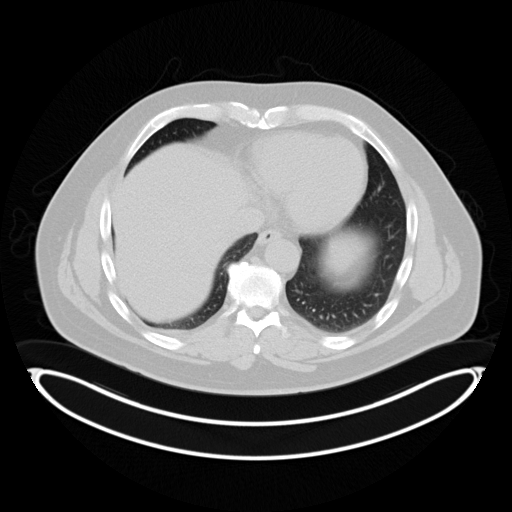
[im 214/268]
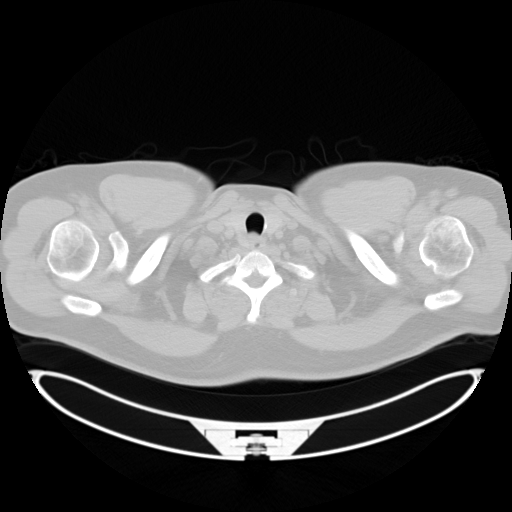
[im 268/268  brain]
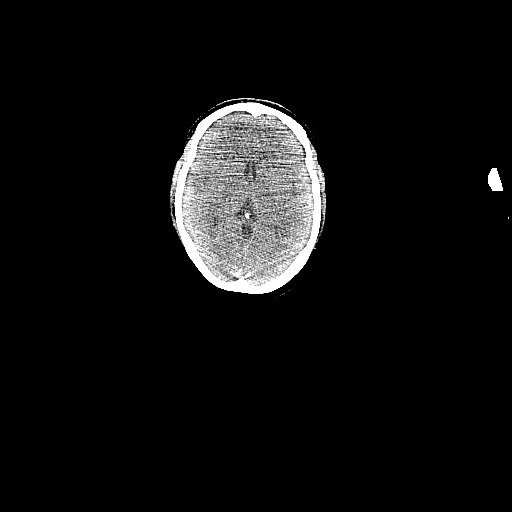

[Series 5: pet sk_thigh nac · axial · 5.0mm · 4.07mm/px · z∈[-1106,-38]mm · 6 of 268 slices shown]
[im 1/268]
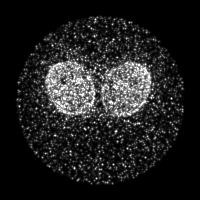
[im 54/268]
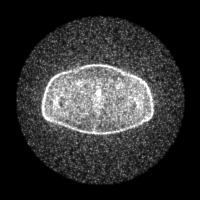
[im 107/268]
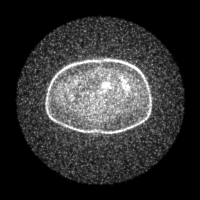
[im 161/268]
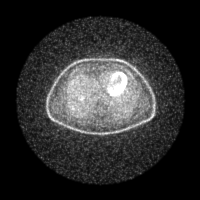
[im 214/268]
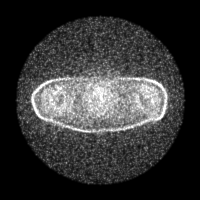
[im 268/268]
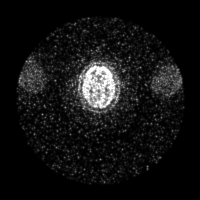

[Series 7: ct 5.0 bl57 lung_bone · axial · 5.0mm · 0.67mm/px · 1 of 69 slices shown]
[im 1/69]
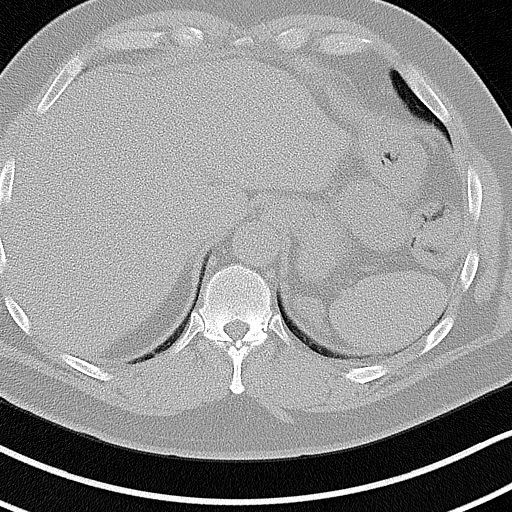

[Series 603: fused tra · 5 of 246 slices shown]
[im 1/246]
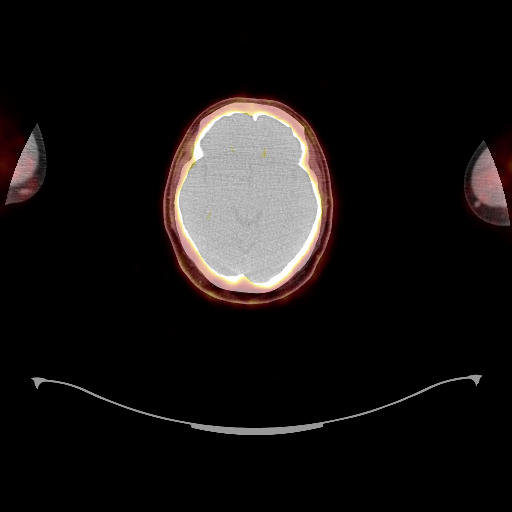
[im 62/246]
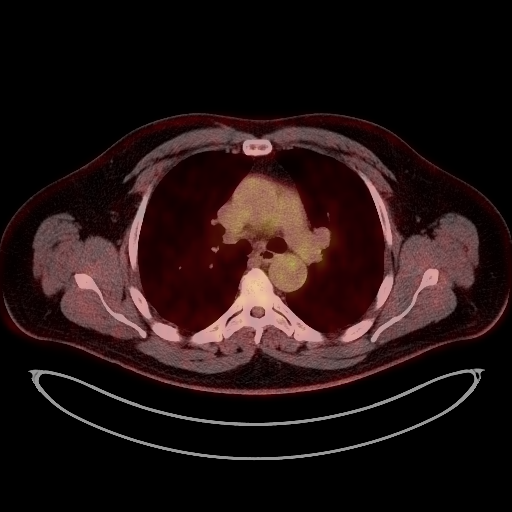
[im 123/246]
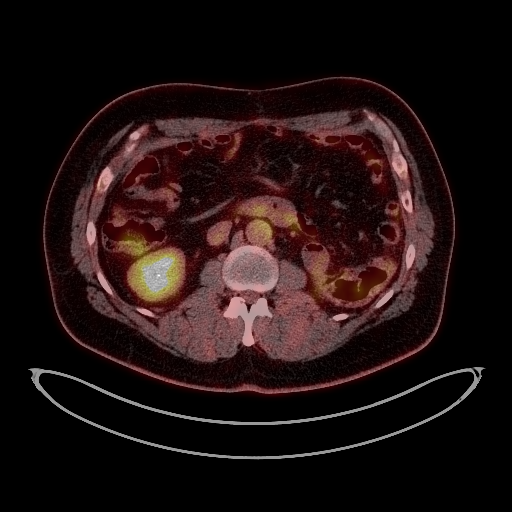
[im 184/246]
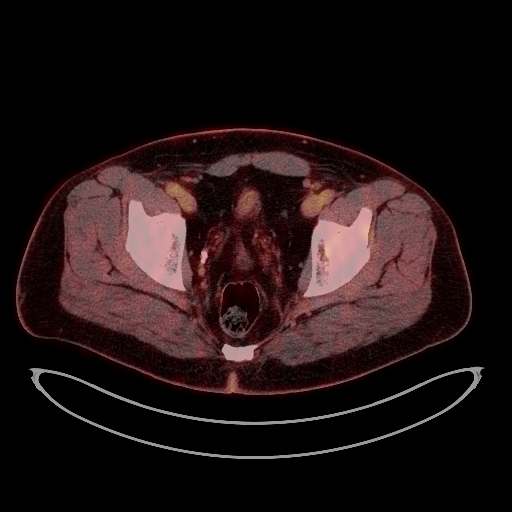
[im 246/246]
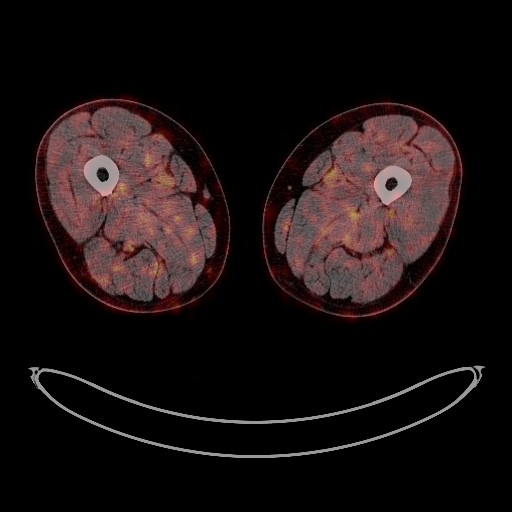

[Series 604: fused cor · 1 of 50 slices shown]
[im 1/50]
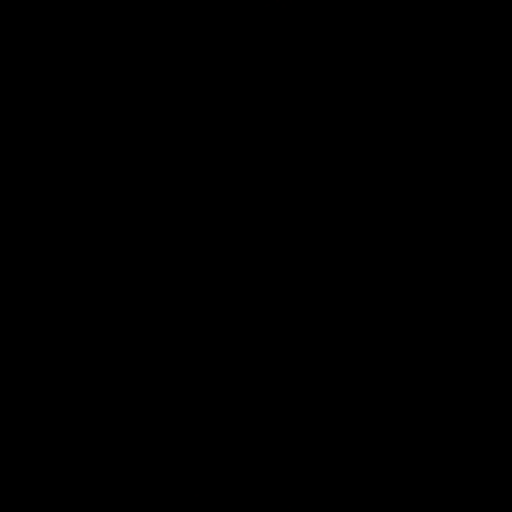

[Series 605: mip pet · coronal · 2.21mm/px · 1 of 32 slices shown]
[im 1/32]
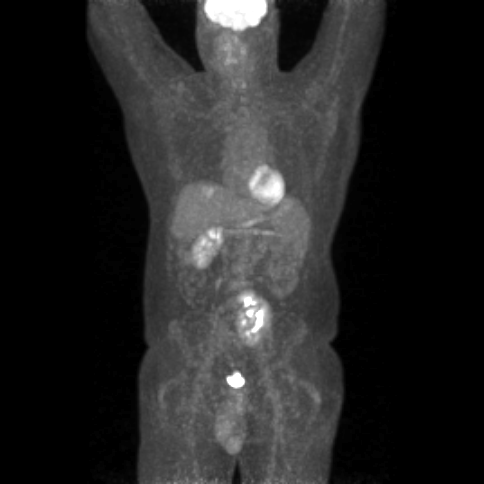

[25 of 25 positions shown; findings below may reference images not displayed]

FINDINGS: Mediastinal blood pool activity: SUV max

Liver activity: SUV max NA

NECK: No hypermetabolic lymph nodes in the neck.

Incidental CT findings: none

CHEST: No hypermetabolic mediastinal or hilar nodes. No suspicious
pulmonary nodules on the CT scan.

Incidental CT findings: none

ABDOMEN/PELVIS: Tracer avid lesion within the dome of liver has an
SUV max of 6.41, image 107 of the fused PET-CT images. This is not
visible on the corresponding unenhanced CT images. On the PET-CT
from [DATE] this had an SUV max of 4.3. On the recent MRI of the
abdomen this measured 2.2 x 2.5 x 2.7 cm. No additional abnormal
foci of increased radiotracer uptake within the liver.

No abnormal uptake identified within the pancreas, spleen or adrenal
glands. No tracer avid right abdominopelvic lymph nodes. Anastomotic
suture line is identified at the rectosigmoid junction. No signs of
locally recurrent tumor.

Incidental CT findings: Left pelvic kidney with large Bosniak class
1 exophytic cyst is again noted. No follow-up recommended.

SKELETON: No focal hypermetabolic activity to suggest skeletal
metastasis.

Incidental CT findings: none
IMPRESSION: 1. Increased radiotracer uptake is identified within the dome of
liver corresponding to the enhancing liver lesion on recent MRI of
the abdomen. Imaging findings are concerning for recurrent liver
metastases.
2. No additional sites of FDG avid disease.

## 2021-10-07 MED ORDER — FLUDEOXYGLUCOSE F - 18 (FDG) INJECTION
14.1000 | Freq: Once | INTRAVENOUS | Status: AC
  Administered 2021-10-07: 14 via INTRAVENOUS

## 2021-10-07 NOTE — Telephone Encounter (Signed)
Patient is scheduled to see them on Thursday 10/13/21 at 11am. Patient aware per Izora Gala at [facility] Dr. Verita Schneiders office.

## 2021-10-12 HISTORY — PX: LIVER RESECTION: SHX1977

## 2021-10-13 DIAGNOSIS — R932 Abnormal findings on diagnostic imaging of liver and biliary tract: Secondary | ICD-10-CM | POA: Diagnosis not present

## 2021-10-13 DIAGNOSIS — C189 Malignant neoplasm of colon, unspecified: Secondary | ICD-10-CM | POA: Diagnosis not present

## 2021-10-13 DIAGNOSIS — C787 Secondary malignant neoplasm of liver and intrahepatic bile duct: Secondary | ICD-10-CM | POA: Diagnosis not present

## 2021-10-13 DIAGNOSIS — Z6833 Body mass index (BMI) 33.0-33.9, adult: Secondary | ICD-10-CM | POA: Diagnosis not present

## 2021-10-18 DIAGNOSIS — Z28310 Unvaccinated for covid-19: Secondary | ICD-10-CM | POA: Diagnosis not present

## 2021-10-18 DIAGNOSIS — E871 Hypo-osmolality and hyponatremia: Secondary | ICD-10-CM | POA: Diagnosis not present

## 2021-10-18 DIAGNOSIS — R918 Other nonspecific abnormal finding of lung field: Secondary | ICD-10-CM | POA: Diagnosis not present

## 2021-10-18 DIAGNOSIS — Z6833 Body mass index (BMI) 33.0-33.9, adult: Secondary | ICD-10-CM | POA: Diagnosis not present

## 2021-10-18 DIAGNOSIS — E669 Obesity, unspecified: Secondary | ICD-10-CM | POA: Diagnosis not present

## 2021-10-18 DIAGNOSIS — K9189 Other postprocedural complications and disorders of digestive system: Secondary | ICD-10-CM | POA: Diagnosis not present

## 2021-10-18 DIAGNOSIS — R339 Retention of urine, unspecified: Secondary | ICD-10-CM | POA: Diagnosis not present

## 2021-10-18 DIAGNOSIS — K3189 Other diseases of stomach and duodenum: Secondary | ICD-10-CM | POA: Diagnosis not present

## 2021-10-18 DIAGNOSIS — J9811 Atelectasis: Secondary | ICD-10-CM | POA: Diagnosis not present

## 2021-10-18 DIAGNOSIS — C189 Malignant neoplasm of colon, unspecified: Secondary | ICD-10-CM | POA: Diagnosis not present

## 2021-10-18 DIAGNOSIS — J90 Pleural effusion, not elsewhere classified: Secondary | ICD-10-CM | POA: Diagnosis not present

## 2021-10-18 DIAGNOSIS — C787 Secondary malignant neoplasm of liver and intrahepatic bile duct: Secondary | ICD-10-CM | POA: Diagnosis not present

## 2021-10-18 DIAGNOSIS — R109 Unspecified abdominal pain: Secondary | ICD-10-CM | POA: Diagnosis not present

## 2021-10-18 DIAGNOSIS — Z4682 Encounter for fitting and adjustment of non-vascular catheter: Secondary | ICD-10-CM | POA: Diagnosis not present

## 2021-10-18 DIAGNOSIS — R110 Nausea: Secondary | ICD-10-CM | POA: Diagnosis not present

## 2021-10-18 DIAGNOSIS — Z9889 Other specified postprocedural states: Secondary | ICD-10-CM | POA: Diagnosis not present

## 2021-10-18 DIAGNOSIS — Z9049 Acquired absence of other specified parts of digestive tract: Secondary | ICD-10-CM | POA: Diagnosis not present

## 2021-10-18 DIAGNOSIS — G8918 Other acute postprocedural pain: Secondary | ICD-10-CM | POA: Diagnosis not present

## 2021-10-18 DIAGNOSIS — K660 Peritoneal adhesions (postprocedural) (postinfection): Secondary | ICD-10-CM | POA: Diagnosis not present

## 2021-10-18 DIAGNOSIS — C187 Malignant neoplasm of sigmoid colon: Secondary | ICD-10-CM | POA: Diagnosis not present

## 2021-10-18 DIAGNOSIS — K838 Other specified diseases of biliary tract: Secondary | ICD-10-CM | POA: Diagnosis not present

## 2021-10-18 DIAGNOSIS — N281 Cyst of kidney, acquired: Secondary | ICD-10-CM | POA: Diagnosis not present

## 2021-10-18 DIAGNOSIS — D62 Acute posthemorrhagic anemia: Secondary | ICD-10-CM | POA: Diagnosis not present

## 2021-10-18 DIAGNOSIS — J939 Pneumothorax, unspecified: Secondary | ICD-10-CM | POA: Diagnosis not present

## 2021-10-18 DIAGNOSIS — I10 Essential (primary) hypertension: Secondary | ICD-10-CM | POA: Diagnosis not present

## 2021-10-20 ENCOUNTER — Telehealth: Payer: Self-pay | Admitting: Oncology

## 2021-10-20 NOTE — Telephone Encounter (Signed)
Contacted pt to schedule an appt. Unable to reach via phone, voicemail was full.  Scheduling Message Entered by [name] H on 10/18/2021 at  9:19 AM Priority: Routine EST PT 30  Department: [facility]  Provider:   Scheduling Notes:  I think having liver resection today at [facility], so will need f/u appt here in 3-4 weeks, with labs

## 2021-11-01 DIAGNOSIS — Z9889 Other specified postprocedural states: Secondary | ICD-10-CM | POA: Diagnosis not present

## 2021-11-01 DIAGNOSIS — R188 Other ascites: Secondary | ICD-10-CM | POA: Diagnosis not present

## 2021-11-01 DIAGNOSIS — Z609 Problem related to social environment, unspecified: Secondary | ICD-10-CM | POA: Diagnosis not present

## 2021-11-01 DIAGNOSIS — J929 Pleural plaque without asbestos: Secondary | ICD-10-CM | POA: Diagnosis not present

## 2021-11-01 DIAGNOSIS — Z6836 Body mass index (BMI) 36.0-36.9, adult: Secondary | ICD-10-CM | POA: Diagnosis not present

## 2021-11-01 DIAGNOSIS — J9811 Atelectasis: Secondary | ICD-10-CM | POA: Diagnosis not present

## 2021-11-01 DIAGNOSIS — R509 Fever, unspecified: Secondary | ICD-10-CM | POA: Diagnosis not present

## 2021-11-01 DIAGNOSIS — Z4682 Encounter for fitting and adjustment of non-vascular catheter: Secondary | ICD-10-CM | POA: Diagnosis not present

## 2021-11-01 DIAGNOSIS — R0602 Shortness of breath: Secondary | ICD-10-CM | POA: Diagnosis not present

## 2021-11-01 DIAGNOSIS — Y838 Other surgical procedures as the cause of abnormal reaction of the patient, or of later complication, without mention of misadventure at the time of the procedure: Secondary | ICD-10-CM | POA: Diagnosis not present

## 2021-11-01 DIAGNOSIS — R918 Other nonspecific abnormal finding of lung field: Secondary | ICD-10-CM | POA: Diagnosis not present

## 2021-11-01 DIAGNOSIS — T8143XA Infection following a procedure, organ and space surgical site, initial encounter: Secondary | ICD-10-CM | POA: Diagnosis not present

## 2021-11-01 DIAGNOSIS — J90 Pleural effusion, not elsewhere classified: Secondary | ICD-10-CM | POA: Diagnosis not present

## 2021-11-01 DIAGNOSIS — K750 Abscess of liver: Secondary | ICD-10-CM | POA: Diagnosis not present

## 2021-11-01 DIAGNOSIS — R109 Unspecified abdominal pain: Secondary | ICD-10-CM | POA: Diagnosis not present

## 2021-11-01 DIAGNOSIS — Z8505 Personal history of malignant neoplasm of liver: Secondary | ICD-10-CM | POA: Diagnosis not present

## 2021-11-01 DIAGNOSIS — J939 Pneumothorax, unspecified: Secondary | ICD-10-CM | POA: Diagnosis not present

## 2021-11-14 ENCOUNTER — Telehealth: Payer: Self-pay | Admitting: Oncology

## 2021-11-14 DIAGNOSIS — J90 Pleural effusion, not elsewhere classified: Secondary | ICD-10-CM | POA: Insufficient documentation

## 2021-11-14 NOTE — Telephone Encounter (Signed)
11/14/21 Spoke with wife and confirmed appts

## 2021-11-17 ENCOUNTER — Ambulatory Visit: Payer: BC Managed Care – PPO | Admitting: Oncology

## 2021-11-17 ENCOUNTER — Other Ambulatory Visit: Payer: BC Managed Care – PPO

## 2021-11-17 DIAGNOSIS — J90 Pleural effusion, not elsewhere classified: Secondary | ICD-10-CM | POA: Diagnosis not present

## 2021-11-17 DIAGNOSIS — R918 Other nonspecific abnormal finding of lung field: Secondary | ICD-10-CM | POA: Diagnosis not present

## 2021-11-17 DIAGNOSIS — R0600 Dyspnea, unspecified: Secondary | ICD-10-CM | POA: Diagnosis not present

## 2021-11-20 ENCOUNTER — Other Ambulatory Visit: Payer: Self-pay | Admitting: Oncology

## 2021-11-20 DIAGNOSIS — C187 Malignant neoplasm of sigmoid colon: Secondary | ICD-10-CM

## 2021-11-21 ENCOUNTER — Other Ambulatory Visit: Payer: Self-pay | Admitting: Oncology

## 2021-11-21 ENCOUNTER — Inpatient Hospital Stay: Payer: BC Managed Care – PPO | Attending: Oncology

## 2021-11-21 ENCOUNTER — Inpatient Hospital Stay (HOSPITAL_BASED_OUTPATIENT_CLINIC_OR_DEPARTMENT_OTHER): Payer: BC Managed Care – PPO | Admitting: Oncology

## 2021-11-21 VITALS — BP 131/76 | HR 116 | Temp 98.5°F | Resp 18 | Ht 78.0 in | Wt 260.1 lb

## 2021-11-21 DIAGNOSIS — D500 Iron deficiency anemia secondary to blood loss (chronic): Secondary | ICD-10-CM

## 2021-11-21 DIAGNOSIS — C787 Secondary malignant neoplasm of liver and intrahepatic bile duct: Secondary | ICD-10-CM

## 2021-11-21 DIAGNOSIS — C187 Malignant neoplasm of sigmoid colon: Secondary | ICD-10-CM | POA: Diagnosis not present

## 2021-11-21 DIAGNOSIS — R059 Cough, unspecified: Secondary | ICD-10-CM | POA: Diagnosis not present

## 2021-11-21 DIAGNOSIS — C801 Malignant (primary) neoplasm, unspecified: Secondary | ICD-10-CM

## 2021-11-21 DIAGNOSIS — J90 Pleural effusion, not elsewhere classified: Secondary | ICD-10-CM | POA: Diagnosis not present

## 2021-11-21 DIAGNOSIS — D509 Iron deficiency anemia, unspecified: Secondary | ICD-10-CM | POA: Diagnosis not present

## 2021-11-21 LAB — CBC AND DIFFERENTIAL
HCT: 27 — AB (ref 41–53)
Hemoglobin: 8.8 — AB (ref 13.5–17.5)
Neutrophils Absolute: 6.75
Platelets: 526 10*3/uL — AB (ref 150–400)
WBC: 9

## 2021-11-21 LAB — HEPATIC FUNCTION PANEL
ALT: 24 U/L (ref 10–40)
AST: 30 (ref 14–40)
Alkaline Phosphatase: 95 (ref 25–125)
Bilirubin, Total: 0.6

## 2021-11-21 LAB — BASIC METABOLIC PANEL
BUN: 13 (ref 4–21)
CO2: 25 — AB (ref 13–22)
Chloride: 100 (ref 99–108)
Creatinine: 0.7 (ref 0.6–1.3)
Glucose: 112
Potassium: 4.2 mEq/L (ref 3.5–5.1)
Sodium: 134 — AB (ref 137–147)

## 2021-11-21 LAB — CBC: RBC: 3.44 — AB (ref 3.87–5.11)

## 2021-11-21 LAB — COMPREHENSIVE METABOLIC PANEL
Albumin: 3.3 — AB (ref 3.5–5.0)
Calcium: 8.5 — AB (ref 8.7–10.7)

## 2021-11-21 MED ORDER — BENZONATATE 200 MG PO CAPS: 200.0000 mg | ORAL_CAPSULE | Freq: Three times a day (TID) | ORAL | 1 refills | Status: DC | PRN

## 2021-11-21 MED ORDER — LORAZEPAM 1 MG PO TABS: 1.0000 mg | ORAL_TABLET | Freq: Three times a day (TID) | ORAL | 0 refills | Status: DC | PRN

## 2021-11-21 NOTE — Progress Notes (Signed)
[facility]  [address] [place],  [place]  [address] [phone]  Clinic Day:  11/21/21  Referring physician: Angelina Sheriff, MD   CHIEF COMPLAINT:  CC: Recurrent liver metastasis  Current Treatment: Chemotherapy   HISTORY OF PRESENT ILLNESS:  Eduardo Hester is a 51 y.o. 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  He presented with fevers and chills in September 2017 and was found to have diverticulitis, which was treated.  CT at that time revealed a 1.7 cm lesion in the liver, which was nonspecific, as well as ectopic left kidney with a cyst.  He was then brought back for a colonoscopy, which revealed a 3.5 cm mass in the sigmoid.  Biopsy revealed high-grade dysplasia.  CT abdomen and pelvis revealed an indeterminate lesion in the liver in addition to wall thickening in the sigmoid colon.  MRI abdomen revealed the lesion in the liver to represent complex cyst versus vascular lesion.  Air contrast barium enema revealed an apple-core lesion of the sigmoid colon.  Baseline CEA was 0.6.  He underwent surgical resection in November 2017.  Pathology revealed a 4 cm, grade 2, adenocarcinoma of the sigmoid colon, as well as chronic active diverticulitis with an area of perforation, however, the tumor was not perforated.  Tumor invaded through the muscularis propria into the pericolorectal tissues.  4/21 nodes were positive for metastasis.  Margins were clear.  MMR and MSI were normal.  KRAS and BRAF mutations were negative.  He had iron deficiency treated with oral iron supplement in the form of Hemocyte daily.  Due to his age of diagnosis and family history he underwent testing for hereditary non polyposis colorectal cancer with the Myriad myRisk Hereditary Cancer Panel test.  This did not reveal any clinically significant mutation or variants of uncertain significance.  Repeat MRI abdomen in January 2018 revealed a stable lesion  within the right lobe of the liver, most consistent with benign lesion.  There was new small volume ascites seen.  The patient received adjuvant chemotherapy with FOLFOX  for 12 cycles, which was completed in June.  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  He was seen for routine follow-up in September 2018 with a repeat MRI abdomen to re-evaluate the liver lesion.  That revealed a large volume ascites with findings suspicious for peritoneal disease.  The lesion in the right lobe of the liver was stable and still assistant with a benign lesion.  The patient underwent paracentesis with removal of 4.7 L of fluid, but fluid cytology was negative for malignancy.  PET scan in early October did not reveal any hypermetabolic activity, but moderate ascites was seen.  Ultrasound-guided peritoneal biopsy was negative for malignancy, revealing adipose tissue with fibrosis, patchy inflammation and minimal atypia.  Dr. Noberto Retort performed colonoscopy in October 2018, which was negative.  The patient then underwent exploratory laparotomy with multiple peritoneal biopsies.  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  We therefore recommended observation for the patient.  In November 2018, gallbladder ultrasound revealed cholelithiasis with sludge and polyps, as well as mild gallbladder wall thickening.  Mild ascites was also seen.  CT abdomen and pelvis revealed mild diffuse gallbladder wall edema otherwise unchanged from PET-CT done in October.  He underwent cholecystectomy with findings of acute  cholecystitis.  Pathology did not reveal any evidence of malignancy.    In April 2019, as he was found to have abdominal pain and an elevated bilirubin and liver transaminases.  Due to the laboratory abnormalities, he underwent repeat MRI abdomen at  that time, which revealed a stable lesion in the right hepatic lobe, which was felt to be indeterminate, with a 2nd smaller indeterminate lesion in the right hepatic lobe measuring 12 mm, which was new from previous imaging.  CEA was normal.  Hepatitis panel and CMV were negative  we felt the laboratory abnormalities were most likely secondary to viral illness.  He was followed closely with multiple MRI scans.  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  MRI abdomen from January 11th 2021 revealed the mass in the junction of segments 7 and 8 in the liver has increased in size compared to the prior exam, currently 2.9 x 2.1 x 2.5 cm.  The lesion has a branching component, some of which may be from portal vein thrombus or tumor thrombus, even this branching component appears to enhance on subtraction images.  CEA was 1.2.  PET scan from January 2021 revealed the liver mass in the junction of segments 7 and 8 to be mildly hypermetabolic with a max SUV of 4.3 compared to typical background liver SUV of 3.5.  Biopsy was pursued on February 2nd, and this is an adenocarcinoma with necrosis, consistent with metastasis from colon primary.  He was referred to Dr. Rolla Etienne with [facility] and had liver resection on February 24th.  We discussed the option of further chemotherapy, including the risks and benefits, and he opted for surveillance. MRI abdomen and pelvis from August 2022 revealed no evidence of recurrent or metastatic disease in the abdomen or pelvis. No lymphadenopathy. Re-demonstrated susceptibility artifact at the anterior and central aspect of the liver dome, in keeping with prior partial right hepatectomy. Left pelvic kidney with a large, exophytic cyst of the  inferior pole measuring 11.0 cm.   INTERVAL HISTORY:  Eduardo Hester is here for routine follow up and was found to have a recurrent liver metastasis found on MRI scan on Sep 29, 2021.  He was returned referred to Dr. Rolla Etienne and had surgery on June 13.  He did have postoperative complications including biliary leak and had ERCP with placement of an endoscopic stent into the biliary duct.  However he was readmitted on June 27 with abscess and perihepatic fluid collection that required placement of a drain.  He had fevers and has felt very poorly since that time.  He also had a pleural effusion on July 1 and had a right thoracoscopic total pulmonary decortication.  He was discharged on July 6 with his JP drain in place and this still remains.  He will be seeing Dr. Ihor Austin in 2 weeks and hopefully that will be removed at that time.  He understands that he will need chemotherapy and we will ask Dr. Noberto Retort to place his port.  I recommend FOLFIRI and a Avastin then reviewed the schedule and potential toxicities with him and his wife.  He will have a formal chemotherapy education session.  I reviewed the final pathology and he had a grade 2 adenocarcinoma  of the liver measuring 4.5 cm with one negative node.  There was a close margin.  The tissue from the diaphragm was benign.  He is also severely anemic and symptomatic from that with dyspnea.  He is also having problems with cough which is nonproductive and insomnia.  He tells me Ativan helped him sleep in the hospital.  He denies fever, chills or other signs of infection.  He denies nausea, vomiting, bowel issues, or abdominal pain.  He denies sore throat, cough, dyspnea, or chest pain.  He has lost 22 pounds since his visit here in early March.  His hemoglobin in the hospital was 7.3 and dropped to 6.3 but is doing better at this time at 8.8 with an MCV of 80 and a platelet count of 526,000.  The rest of the CBC and CMP are unremarkable.  I have ordered  iron studies as well as B12 and folate levels today.  REVIEW OF SYSTEMS:  Review of Systems  Constitutional:  Positive for appetite change and fatigue. Negative for chills, fever and unexpected weight change.  HENT:  Negative.    Eyes: Negative.   Respiratory:  Positive for cough. Negative for chest tightness, hemoptysis, shortness of breath and wheezing.   Cardiovascular: Negative.  Negative for chest pain, leg swelling and palpitations.  Gastrointestinal:  Negative for abdominal distention, abdominal pain, blood in stool, diarrhea, nausea and vomiting.  Endocrine: Negative.   Genitourinary: Negative.  Negative for difficulty urinating, dysuria, frequency and hematuria.   Musculoskeletal: Negative.  Negative for arthralgias, back pain, flank pain, gait problem and myalgias.  Skin: Negative.   Neurological: Negative.  Negative for dizziness, extremity weakness, gait problem, headaches, light-headedness, numbness, seizures and speech difficulty.  Hematological: Negative.   Psychiatric/Behavioral:  Positive for sleep disturbance. Negative for depression. The patient is not nervous/anxious.   All other systems reviewed and are negative.    VITALS:  Blood pressure 131/76, pulse (!) 116, temperature 98.5 F (36.9 C), temperature source Oral, resp. rate 18, height 6' 6"  (1.981 m), weight 260 lb 1.6 oz (118 kg), SpO2 98 %.  Wt Readings from Last 3 Encounters:  11/21/21 260 lb 1.6 oz (118 kg)  07/07/21 282 lb 3.2 oz (128 kg)  01/06/21 267 lb (121.1 kg)    Body mass index is 30.06 kg/m.  Performance status (ECOG): 0 - Asymptomatic  PHYSICAL EXAM:  Physical Exam Constitutional:      General: He is not in acute distress.    Appearance: Normal appearance. He is normal weight.  HENT:     Head: Normocephalic and atraumatic.  Eyes:     General: No scleral icterus.    Extraocular Movements: Extraocular movements intact.     Conjunctiva/sclera: Conjunctivae normal.     Pupils: Pupils are  equal, round, and reactive to light.  Cardiovascular:     Rate and Rhythm: Normal rate and regular rhythm.     Pulses: Normal pulses.     Heart sounds: Normal heart sounds. No murmur heard.    No friction rub. No gallop.  Pulmonary:     Effort: Pulmonary effort is normal. No respiratory distress.     Breath sounds: Normal breath sounds.     Comments: Decreased breath sounds at the right base Abdominal:     General: Bowel sounds are normal. There is no distension.     Palpations: Abdomen is soft. There is no hepatomegaly, splenomegaly or mass.     Tenderness: There is no abdominal tenderness.  Comments: Thick well healed scar of the mid upper abdomen.  He still has a drain in place in the right upper quadrant.  Musculoskeletal:        General: Normal range of motion.     Cervical back: Normal range of motion and neck supple.     Right lower leg: No edema.     Left lower leg: No edema.  Lymphadenopathy:     Cervical: No cervical adenopathy.  Skin:    General: Skin is warm and dry.  Neurological:     General: No focal deficit present.     Mental Status: He is alert and oriented to person, place, and time. Mental status is at baseline.  Psychiatric:        Mood and Affect: Mood normal.        Behavior: Behavior normal.        Thought Content: Thought content normal.        Judgment: Judgment normal.     LABS:      Latest Ref Rng & Units 11/21/2021   12:00 AM 07/07/2021   12:00 AM 01/06/2021   12:00 AM  CBC  WBC  9.0     7.3  5.9      Hemoglobin 13.5 - 17.5 8.8     15.7  15.0      Hematocrit 41 - 53 27     47  44      Platelets 150 - 400 K/uL 526     225  203         This result is from an external source.      Latest Ref Rng & Units 11/21/2021   12:00 AM 07/07/2021   12:00 AM 01/06/2021   12:00 AM  CMP  BUN 4 - 21 13     14  14       Creatinine 0.6 - 1.3 0.7     1.0  1.1      Sodium 137 - 147 134     140  138      Potassium 3.5 - 5.1 mEq/L 4.2     3.9  4.2       Chloride 99 - 108 100     108  101      CO2 13 - 22 25     25  30       Calcium 8.7 - 10.7 8.5     8.6  9.1      Alkaline Phos 25 - 125 95     95  89      AST 14 - 40 30     35  33      ALT 10 - 40 U/L 24     28  24          This result is from an external source.     Lab Results  Component Value Date   CEA1 <0.6 11/21/2021   /  CEA  Date Value Ref Range Status  11/21/2021 <0.6 0.0 - 4.7 ng/mL Final    Comment:    (NOTE)                             Nonsmokers          <3.9                             Smokers             <  5.6 Roche [employer] Electrochemiluminescence Immunoassay (ECLIA) Values obtained with different assay methods or kits cannot be used interchangeably.  Results cannot be interpreted as absolute evidence of the presence or absence of malignant disease. Performed At: [facility] [place], [place] [id number] Rush Farmer MD UX:[id number]      STUDIES:  No results found.    Allergies: No Known Allergies  Current Medications: Current Outpatient Medications  Medication Sig Dispense Refill   ascorbic acid (VITAMIN C) 1000 MG tablet Take by mouth.     benzonatate (TESSALON) 200 MG capsule Take 1 capsule (200 mg total) by mouth 3 (three) times daily as needed for cough. 20 capsule 1   Calcium Polycarbophil (FIBER) 625 MG TABS Take by mouth.     cetirizine (ZYRTEC) 10 MG tablet Take by mouth.     Cholecalciferol (VITAMIN D) 50 MCG (2000 UT) tablet Take by mouth.     LORazepam (ATIVAN) 1 MG tablet Take 1 tablet (1 mg total) by mouth every 8 (eight) hours as needed for anxiety or sleep. 30 tablet 0   Multiple Vitamin (MULTI-VITAMIN) tablet Take 1 tablet by mouth daily.     omeprazole (PRILOSEC) 20 MG capsule Take by mouth.     ondansetron (ZOFRAN-ODT) 4 MG disintegrating tablet Take 4 mg by mouth every 8 (eight) hours as needed.     oxyCODONE (OXY IR/ROXICODONE) 5 MG immediate release tablet Take by mouth.     vitamin B-12  (CYANOCOBALAMIN) 250 MCG tablet Take by mouth.     zinc gluconate 50 MG tablet Take 50 mg by mouth daily.     No current facility-administered medications for this visit.     ASSESSMENT & PLAN:   Assessment:   1. 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  2. He had ascites and peritoneal nodules in 2018 with negative biopsy and negative fluid cytology.  He was taken for exploratory laparotomy with removal of the nodule and this was confirmed to be benign.  3. Liver metastasis confirmed in January of 2021, which was resected in February, and was larger than it appeared on imaging, at 4.7 cm at the time of resection.  The margins are close but seem to be clear.  One margin is uncertain and another is less than 1 mm on the anterior aspect.  We discussed the risks and benefits of chemotherapy versus observation and decided to go with surveillance at this time.  4.  Recurrent liver metastasis which was once again resected, and found to be a 4.5 cm grade 2 adenocarcinoma consistent with his colon primary.  There was 1 negative node but margins were close.  This was attached to the diaphragm but the tissue from the diaphragm is benign.  I do recommend chemotherapy with FOLFIRI and Avastin for at least 6 months.  He will need placement of a port.  5.  Iron deficiency anemia.  We will check iron studies as well as B12 and folate but I think he would benefit from iron supplement.  6.  Insomnia and some degree of depression.  We will try him on Ativan but he may need an antidepressant.  7.  Pleural effusion requiring chest tube and thoracoscopy.  He now has persistent cough which may be related to that.  I will prescribe Tessalon Perles for symptomatic relief.  We will need repeat chest x-ray when he has his port placed to  follow-up on the pleural effusion.   Plan: We plan chemotherapy with  FOLFIRI and a Avastin and so I will ask Dr. Almyra Brace to place a port.  I will prescribe Tessalon Perles for his cough and Ativan 1 mg for his insomnia.  He may need an antidepressant as well.  Want to do another chest x-ray to follow-up on the pleural effusion and this will be done after his port is placed.  He is anemic and will likely need oral iron supplement but if he cannot tolerate that, we can offer IV.  They understand and agree with this plan of care.   I provided 30 minutes of face-to-face time during this this encounter and > 50% was spent counseling as documented under my assessment and plan.    Derwood Kaplan, MD [facility] AT [facility] [address] [place] [address] Dept: [phone] Dept Fax: [phone]

## 2021-11-22 ENCOUNTER — Other Ambulatory Visit: Payer: Self-pay

## 2021-11-22 ENCOUNTER — Telehealth: Payer: Self-pay

## 2021-11-22 DIAGNOSIS — C187 Malignant neoplasm of sigmoid colon: Secondary | ICD-10-CM | POA: Diagnosis not present

## 2021-11-22 DIAGNOSIS — C787 Secondary malignant neoplasm of liver and intrahepatic bile duct: Secondary | ICD-10-CM

## 2021-11-22 DIAGNOSIS — D500 Iron deficiency anemia secondary to blood loss (chronic): Secondary | ICD-10-CM

## 2021-11-22 DIAGNOSIS — D509 Iron deficiency anemia, unspecified: Secondary | ICD-10-CM | POA: Diagnosis not present

## 2021-11-22 DIAGNOSIS — J90 Pleural effusion, not elsewhere classified: Secondary | ICD-10-CM | POA: Diagnosis not present

## 2021-11-22 DIAGNOSIS — R059 Cough, unspecified: Secondary | ICD-10-CM | POA: Diagnosis not present

## 2021-11-22 LAB — IRON AND TIBC
Iron: 13 ug/dL — ABNORMAL LOW (ref 45–182)
Saturation Ratios: 6 % — ABNORMAL LOW (ref 17.9–39.5)
TIBC: 225 ug/dL — ABNORMAL LOW (ref 250–450)
UIBC: 212 ug/dL

## 2021-11-22 LAB — VITAMIN B12: Vitamin B-12: 903 pg/mL (ref 180–914)

## 2021-11-22 LAB — FERRITIN: Ferritin: 292 ng/mL (ref 24–336)

## 2021-11-22 NOTE — Telephone Encounter (Addendum)
Done. ----- Message from Derwood Kaplan, MD sent at 11/21/2021  7:49 PM EDT ----- Regarding: refer Pls refer Dr. Noberto Retort for port placement.  Ask him to do full PA & lat CXR after procedure so I can see the pleural effusion.

## 2021-11-23 LAB — CEA: CEA: <0.6 ng/mL (ref 0.0–4.7)

## 2021-11-23 LAB — FOLATE: Folate: 33.5 ng/mL (ref >5.9)

## 2021-11-25 ENCOUNTER — Telehealth: Payer: Self-pay

## 2021-11-25 NOTE — Telephone Encounter (Signed)
-----   Message from Derwood Kaplan, MD sent at 11/25/2021  1:24 PM EDT ----- Regarding: call Tell him he is definitely low on iron, B12 and folate look good. Let us know if can't tolerate oral iron

## 2021-11-28 ENCOUNTER — Telehealth: Payer: Self-pay

## 2021-11-28 DIAGNOSIS — C187 Malignant neoplasm of sigmoid colon: Secondary | ICD-10-CM | POA: Diagnosis not present

## 2021-11-28 NOTE — Telephone Encounter (Signed)
-----   Message from Derwood Kaplan, MD sent at 11/25/2021  1:24 PM EDT ----- Regarding: call Tell him he is definitely low on iron, B12 and folate look good. Let us know if can't tolerate oral iron

## 2021-11-28 NOTE — Telephone Encounter (Signed)
Wife states patient is taking Slow FE and is tolerating it ok. Wife Mickel Baas also states he is going for a port consult with Dr.Lininger on 12/05/21. Wants to know when chemo will be started?

## 2021-12-01 ENCOUNTER — Telehealth: Payer: Self-pay | Admitting: Dietician

## 2021-12-01 NOTE — Telephone Encounter (Signed)
Patient screened on MST. First attempt to reach. Provided my cell# on voice mail to return call and sent contact information in text to set up a nutrition consult.  April Manson, RDN, LDN Registered Dietitian, [place] Part Time Remote (Usual office hours: Tuesday-Thursday) Cell: [phone]

## 2021-12-01 NOTE — Telephone Encounter (Signed)
Patient's wife called in response to text.  She was not at home with patient.  I relayed that I couldn't reveal any information until patient gave permission to discuss his health with her.  She reported that much of his recent weight loss (she stated 40#) was fluid related.  He is eating better now, and she has him taking one Premier ONS (30 g Pro) daily. She states his only nutritional concerns now are bowels that have been "running."  She is a Marine scientist and she states she has backed off of his colace and fiber to help.  Provided contact information and schedule for in-person or emote nutrition support if needed.  April Manson, RDN, LDN Registered Dietitian, [place] Part Time Remote (Usual office hours: Tuesday-Thursday) Mobile: [phone] Remote Office: [phone]

## 2021-12-05 DIAGNOSIS — C187 Malignant neoplasm of sigmoid colon: Secondary | ICD-10-CM | POA: Diagnosis not present

## 2021-12-05 DIAGNOSIS — C787 Secondary malignant neoplasm of liver and intrahepatic bile duct: Secondary | ICD-10-CM | POA: Diagnosis not present

## 2021-12-07 ENCOUNTER — Other Ambulatory Visit: Payer: Self-pay | Admitting: Oncology

## 2021-12-07 ENCOUNTER — Encounter: Payer: Self-pay | Admitting: Oncology

## 2021-12-07 DIAGNOSIS — Z85038 Personal history of other malignant neoplasm of large intestine: Secondary | ICD-10-CM | POA: Diagnosis not present

## 2021-12-07 DIAGNOSIS — K219 Gastro-esophageal reflux disease without esophagitis: Secondary | ICD-10-CM | POA: Diagnosis not present

## 2021-12-07 DIAGNOSIS — Z87891 Personal history of nicotine dependence: Secondary | ICD-10-CM | POA: Diagnosis not present

## 2021-12-07 DIAGNOSIS — Z452 Encounter for adjustment and management of vascular access device: Secondary | ICD-10-CM | POA: Diagnosis not present

## 2021-12-07 DIAGNOSIS — C187 Malignant neoplasm of sigmoid colon: Secondary | ICD-10-CM | POA: Diagnosis not present

## 2021-12-07 DIAGNOSIS — C787 Secondary malignant neoplasm of liver and intrahepatic bile duct: Secondary | ICD-10-CM | POA: Diagnosis not present

## 2021-12-07 DIAGNOSIS — R918 Other nonspecific abnormal finding of lung field: Secondary | ICD-10-CM | POA: Diagnosis not present

## 2021-12-07 DIAGNOSIS — Z9221 Personal history of antineoplastic chemotherapy: Secondary | ICD-10-CM | POA: Diagnosis not present

## 2021-12-07 NOTE — Progress Notes (Signed)
START OFF PATHWAY REGIMEN - Colorectal   [id number]:FOLFIRI + Bevacizumab (Leucovorin IV D1 + Fluorouracil IV D1/CIV D1,2 + Irinotecan IV D1 + Bevacizumab IV D1) q14 Days:   A cycle is every 14 days:     Bevacizumab-xxxx      Irinotecan      Leucovorin      Fluorouracil      Fluorouracil   **Always confirm dose/schedule in your pharmacy ordering system**  Patient Characteristics: Distant Metastases, Postoperative Treatment for R0 Resection Tumor Location: Colon Therapeutic Status: Distant Metastases  Intent of Therapy: Curative Intent, Discussed with Patient

## 2021-12-08 ENCOUNTER — Other Ambulatory Visit: Payer: Self-pay

## 2021-12-08 ENCOUNTER — Encounter: Payer: Self-pay | Admitting: Oncology

## 2021-12-08 DIAGNOSIS — C787 Secondary malignant neoplasm of liver and intrahepatic bile duct: Secondary | ICD-10-CM | POA: Diagnosis not present

## 2021-12-09 ENCOUNTER — Inpatient Hospital Stay: Payer: BC Managed Care – PPO

## 2021-12-09 ENCOUNTER — Encounter: Payer: Self-pay | Admitting: Hematology and Oncology

## 2021-12-09 ENCOUNTER — Inpatient Hospital Stay: Payer: BC Managed Care – PPO | Attending: Hematology and Oncology | Admitting: Hematology and Oncology

## 2021-12-09 VITALS — BP 132/89 | HR 90 | Temp 97.9°F | Resp 18 | Ht 78.0 in | Wt 260.3 lb

## 2021-12-09 DIAGNOSIS — Z5111 Encounter for antineoplastic chemotherapy: Secondary | ICD-10-CM | POA: Insufficient documentation

## 2021-12-09 DIAGNOSIS — D509 Iron deficiency anemia, unspecified: Secondary | ICD-10-CM | POA: Insufficient documentation

## 2021-12-09 DIAGNOSIS — C187 Malignant neoplasm of sigmoid colon: Secondary | ICD-10-CM | POA: Diagnosis not present

## 2021-12-09 DIAGNOSIS — Z5112 Encounter for antineoplastic immunotherapy: Secondary | ICD-10-CM | POA: Insufficient documentation

## 2021-12-09 DIAGNOSIS — C787 Secondary malignant neoplasm of liver and intrahepatic bile duct: Secondary | ICD-10-CM

## 2021-12-09 DIAGNOSIS — Z452 Encounter for adjustment and management of vascular access device: Secondary | ICD-10-CM | POA: Insufficient documentation

## 2021-12-09 DIAGNOSIS — C189 Malignant neoplasm of colon, unspecified: Secondary | ICD-10-CM | POA: Diagnosis not present

## 2021-12-09 LAB — TOTAL PROTEIN, URINE DIPSTICK: Protein, ur: NEGATIVE mg/dL

## 2021-12-09 NOTE — Progress Notes (Signed)
[facility] NOTE Patient Care Team: Angelina Sheriff, MD as PCP - General (Family Medicine) Derwood Kaplan, MD as Consulting Physician (Oncology)   Name of the patient: Eduardo Hester  [id number]  09-Jul-1970   Date of visit: 12/09/21  Diagnosis- Colon Cancer  Chief complaint/Reason for visit- Initial Meeting for [facility], preparing for starting chemotherapy   Heme/Onc history:  Oncology History  Colon cancer ([place])  01/23/2016 Cancer Staging   Staging form: Colon and Rectum, AJCC 7th Edition - Clinical stage from 01/23/2016: Stage IIIB (T3, N2a, M0) - Signed by Derwood Kaplan, MD on 05/03/2020 Staging comments: 3.5 cm MMR, MSI normal, No mutation of KRAS or BRAF, neg.genetics Rec'd 6 months adjuvant FOLFOX chemotherapy  02/02/2016 Initial Diagnosis   Colon cancer ([place])   12/13/2021 -  Chemotherapy   Patient is on Treatment Plan : COLORECTAL FOLFIRI / BEVACIZUMAB Q14D     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 I introduced the chemo care clinic and we discussed that the role of the clinic is to assist those who are at an increased risk of emergency room visits and/or complications during the course of chemotherapy treatment. We discussed that the increased risk takes into account factors such as age, performance status, and co-morbidities. We also discussed that for some, this might include barriers to care such as not having a primary care provider, lack of insurance/transportation, or not being able to afford medications. We discussed that the goal of the program is to help prevent unplanned ER visits and help reduce complications during  chemotherapy. We do this by discussing specific risk factors to each individual and identifying ways that we can help improve these risk factors and reduce barriers to care.   No Known Allergies  Past Medical History:  Diagnosis Date   GERD (gastroesophageal reflux disease)    Renal lithiasis     Past Surgical History:  Procedure Laterality Date   SIGMOIDECTOMY  03/2016    Social History   Socioeconomic History   Marital status: Married    Spouse name: Not on file   Number of children: 2   Years of education: Not on file   Highest education level: Not on file  Occupational History   Not on file  Tobacco Use   Smoking status: Former   Smokeless tobacco: Never  Substance and Sexual Activity   Alcohol use: Not Currently   Drug use: Not on file   Sexual activity: Not on file  Other Topics Concern   Not on file  Social History Narrative   Not on file   Social Determinants of Health   Financial Resource Strain: Not on file  Food Insecurity: Not on file  Transportation Needs: Not on file  Physical Activity: Not on file  Stress: Not on file  Social Connections: Not on file  Intimate Partner Violence: Not on file    Family History  Problem Relation Age of Onset   Lung cancer Father 76   Leukemia Father 85       acute myelogenous   Colon cancer Sister 52     Current Outpatient Medications:    ascorbic acid (  VITAMIN C) 1000 MG tablet, Take by mouth., Disp: , Rfl:    benzonatate (TESSALON) 200 MG capsule, Take 1 capsule (200 mg total) by mouth 3 (three) times daily as needed for cough., Disp: 20 capsule, Rfl: 1   Calcium Polycarbophil (FIBER) 625 MG TABS, Take by mouth., Disp: , Rfl:    cetirizine (ZYRTEC) 10 MG tablet, Take by mouth., Disp: , Rfl:    Cholecalciferol (VITAMIN D) 50 MCG (2000 UT) tablet, Take by mouth., Disp: , Rfl:    LORazepam (ATIVAN) 1 MG tablet, Take 1 tablet (1 mg total) by mouth every 8 (eight) hours as needed for anxiety or sleep., Disp: 30  tablet, Rfl: 0   Multiple Vitamin (MULTI-VITAMIN) tablet, Take 1 tablet by mouth daily., Disp: , Rfl:    omeprazole (PRILOSEC) 20 MG capsule, Take by mouth., Disp: , Rfl:    ondansetron (ZOFRAN-ODT) 4 MG disintegrating tablet, Take 4 mg by mouth every 8 (eight) hours as needed., Disp: , Rfl:    oxyCODONE (OXY IR/ROXICODONE) 5 MG immediate release tablet, Take by mouth., Disp: , Rfl:    vitamin B-12 (CYANOCOBALAMIN) 250 MCG tablet, Take by mouth., Disp: , Rfl:    zinc gluconate 50 MG tablet, Take 50 mg by mouth daily., Disp: , Rfl:      Latest Ref Rng & Units 11/21/2021   12:00 AM  CMP  BUN 4 - 21 13      Creatinine 0.6 - 1.3 0.7      Sodium 137 - 147 134      Potassium 3.5 - 5.1 mEq/L 4.2      Chloride 99 - 108 100      CO2 13 - 22 25      Calcium 8.7 - 10.7 8.5      Alkaline Phos 25 - 125 95      AST 14 - 40 30      ALT 10 - 40 U/L 24         This result is from an external source.      Latest Ref Rng & Units 11/21/2021   12:00 AM  CBC  WBC  9.0      Hemoglobin 13.5 - 17.5 8.8      Hematocrit 41 - 53 27      Platelets 150 - 400 K/uL 526         This result is from an external source.    No images are attached to the encounter.  No results found.   Assessment and plan- Patient is a 51 y.o. male who presents to [facility] for initial meeting in preparation for starting chemotherapy for the treatment of colon cancer.   Chemo Care Clinic/High Risk for ER/Hospitalization during chemotherapy- We discussed the role of the chemo care clinic and identified patient specific risk factors. I discussed that patient was identified as high risk primarily based on:  Patient has past medical history positive for: Past Medical History:  Diagnosis Date   GERD (gastroesophageal reflux disease)    Renal lithiasis     Patient has past surgical history positive for: Past Surgical History:  Procedure Laterality Date   SIGMOIDECTOMY  03/2016   Provided general information  including the following: 1.  Date of education: 12/09/2021 2.  Physician name: Dr. Hinton Rao 3.  Diagnosis: Colon Cancer 4.  Stage: Stage IIIB 5.  Control 6.  Chemotherapy plan including drugs and how often: Fluorouracil, Leucovorin, Irinotecan, Bevacizumab 7.  Start date: Pending authorization 8.  Other referrals: None at this time 9.  The patient is to call our office with any questions or concerns.  Our office number [phone], if after hours or on the weekend, call the same number and wait for the answering service.  There is always an oncologist on call 10.  Medications prescribed: Ondansetron, Prochlorperazine 11.  The patient has verbalized understanding of the treatment plan and has no barriers to adherence or understanding.  Obtained signed consent from patient.  Discussed symptoms including 1.  Low blood counts including red blood cells, white blood cells and platelets. 2. Infection including to avoid large crowds, wash hands frequently, and stay away from people who were sick.  If fever develops of 100.4 or higher, call our office. 3.  Mucositis-given instructions on mouth rinse (baking soda and salt mixture).  Keep mouth clean.  Use soft bristle toothbrush.  If mouth sores develop, call our clinic. 4.  Nausea/vomiting-gave prescriptions for ondansetron 4 mg every 4 hours as needed for nausea, may take around the clock if persistent.  Compazine 10 mg every 6 hours, may take around the clock if persistent. 5.  Diarrhea-use over-the-counter Imodium.  Call clinic if not controlled. 6.  Constipation-use senna, 1 to 2 tablets twice a day.  If no BM in 2 to 3 days call the clinic. 7.  Loss of appetite-try to eat small meals every 2-3 hours.  Call clinic if not eating. 8.  Taste changes-zinc 500 mg daily.  If becomes severe call clinic. 9.  Alcoholic beverages. 10.  Drink 2 to 3 quarts of water per day. 11.  Peripheral neuropathy-patient to call if numbness or tingling in hands or feet  is persistent  Neulasta-will be given 24 to 48 hours after chemotherapy.  Gave information sheet on bone and joint pain.  Use Claritin or Pepcid.  May use ibuprofen or Aleve.  Call if symptoms persist or are unbearable.  Gave information on the supportive care team and how to contact them regarding services.  Discussed advanced directives.  The patient does not have their advanced directives but will look at the copy provided in their notebook and will call with any questions. Spiritual [employer] Financial Social worker Advanced directives  Answered questions to patient satisfaction.  Patient is to call with any further questions or concerns.   The medication prescribed to the patient will be printed out from [facility] references This will give the following information: Name of your medication Approved uses Dose and schedule Storage and handling Handling body fluids and waste Drug and food interactions Possible side effects and management Pregnancy, sexual activity, and contraception Obtaining medication   We discussed that social determinants of health may have significant impacts on health and outcomes for cancer patients.  Today we discussed specific social determinants of performance status, alcohol use, depression, financial needs, food insecurity, housing, interpersonal violence, social connections, stress, tobacco use, and transportation.    After lengthy discussion the following were identified as areas of need:   Outpatient services: We discussed options including home based and outpatient services, DME and care program. We discusssed that patients who participate in regular physical activity report fewer negative impacts of cancer and treatments and report less fatigue.   Financial Concerns: We discussed that living with cancer can create tremendous financial burden.  We discussed options for assistance. I asked that if assistance is needed in affording medications or paying  bills to please let us know so that we can provide assistance. We discussed options for  food including social services and onsite food pantry.  We will also notify Mort Sawyers to see if cancer center can provide additional support.  Referral to Social work: Introduced Education officer, museum Mort Sawyers and the services she can provide such as support with utility bill, cell phone and gas vouchers.   Support groups: We discussed options for support groups at the cancer center. If interested, please notify nurse navigator to enroll. We discussed options for managing stress including healthy eating, exercise as well as participating in no charge counseling services at the cancer center and support groups.  If these are of interest, patient can notify either myself or primary nursing team.We discussed options for management including medications and referral to quit Smart program  Transportation: We discussed options for transportation.  I have notified primary oncology team who will help assist with arranging Lucianne Lei transportation for appointments when/if needed. We also discussed options for transportation on short notice/acute visits.  Palliative care services: We have palliative care services available in the cancer center to discuss goals of care and advanced care planning.  Please let us know if you have any questions or would like to speak to our palliative nurse practitioner.  Symptom Management Clinic: We discussed our symptom management clinic which is available for acute concerns while receiving treatment such as nausea, vomiting or diarrhea.  We can be reached via telephone at [phone] or through my chart.  We are available for virtual or in person visits on the same day from 830 to 4 PM Monday through Friday. He denies needing specific assistance at this time and He will be followed by Dr. Hinton Rao clinical team.  Plan: Discussed symptom management clinic. Discussed palliative care  services. Discussed resources that are available here at the cancer center. Discussed medications and new prescriptions to begin treatment such as anti-nausea or steroids.   Disposition: RTC on   Visit Diagnosis No diagnosis found.  Patient expressed understanding and was in agreement with this plan. He also understands that He can call clinic at any time with any questions, concerns, or complaints.   I provided 30 minutes of  face to face  during this encounter, and > 50% was spent counseling as documented under my assessment & plan.   Dayton Scrape, FNP-BC

## 2021-12-12 ENCOUNTER — Other Ambulatory Visit: Payer: Self-pay | Admitting: Pharmacist

## 2021-12-12 MED ORDER — LOPERAMIDE HCL 2 MG PO CAPS: 2.0000 mg | ORAL_CAPSULE | ORAL | 5 refills | Status: AC | PRN

## 2021-12-12 MED ORDER — PROCHLORPERAZINE MALEATE 10 MG PO TABS: 10.0000 mg | ORAL_TABLET | Freq: Four times a day (QID) | ORAL | 1 refills | Status: DC | PRN

## 2021-12-12 MED ORDER — ONDANSETRON HCL 8 MG PO TABS: 8.0000 mg | ORAL_TABLET | Freq: Two times a day (BID) | ORAL | 1 refills | Status: AC | PRN

## 2021-12-12 MED FILL — Dexamethasone Sodium Phosphate Inj 100 MG/10ML: INTRAMUSCULAR | Qty: 1 | Status: AC

## 2021-12-12 MED FILL — Fluorouracil IV Soln 2.5 GM/50ML (50 MG/ML): INTRAVENOUS | Qty: 20 | Status: AC

## 2021-12-12 MED FILL — Fluorouracil IV Soln 5 GM/100ML (50 MG/ML): INTRAVENOUS | Qty: 122 | Status: AC

## 2021-12-12 MED FILL — Leucovorin Calcium For Inj 350 MG: INTRAMUSCULAR | Qty: 51 | Status: AC

## 2021-12-12 MED FILL — Irinotecan HCl Inj 100 MG/5ML (20 MG/ML): INTRAVENOUS | Qty: 23 | Status: AC

## 2021-12-13 ENCOUNTER — Inpatient Hospital Stay: Payer: BC Managed Care – PPO

## 2021-12-13 VITALS — BP 149/79 | HR 99 | Temp 98.2°F | Resp 18 | Ht 78.0 in | Wt 261.0 lb

## 2021-12-13 DIAGNOSIS — C187 Malignant neoplasm of sigmoid colon: Secondary | ICD-10-CM

## 2021-12-13 DIAGNOSIS — Z5112 Encounter for antineoplastic immunotherapy: Secondary | ICD-10-CM | POA: Diagnosis not present

## 2021-12-13 DIAGNOSIS — Z452 Encounter for adjustment and management of vascular access device: Secondary | ICD-10-CM | POA: Diagnosis not present

## 2021-12-13 DIAGNOSIS — C787 Secondary malignant neoplasm of liver and intrahepatic bile duct: Secondary | ICD-10-CM

## 2021-12-13 DIAGNOSIS — Z5111 Encounter for antineoplastic chemotherapy: Secondary | ICD-10-CM | POA: Diagnosis not present

## 2021-12-13 DIAGNOSIS — D509 Iron deficiency anemia, unspecified: Secondary | ICD-10-CM | POA: Diagnosis not present

## 2021-12-13 DIAGNOSIS — C189 Malignant neoplasm of colon, unspecified: Secondary | ICD-10-CM | POA: Diagnosis not present

## 2021-12-13 MED ORDER — SODIUM CHLORIDE 0.9 % IV SOLN: Freq: Once | INTRAVENOUS | Status: AC

## 2021-12-13 MED ORDER — SODIUM CHLORIDE 0.9 % IV SOLN
2400.0000 mg/m2 | INTRAVENOUS | Status: DC
  Administered 2021-12-13: 6100 mg via INTRAVENOUS
  Filled 2021-12-13: qty 122

## 2021-12-13 MED ORDER — FLUOROURACIL CHEMO INJECTION 2.5 GM/50ML
400.0000 mg/m2 | Freq: Once | INTRAVENOUS | Status: AC
  Administered 2021-12-13: 1000 mg via INTRAVENOUS
  Filled 2021-12-13: qty 20

## 2021-12-13 MED ORDER — SODIUM CHLORIDE 0.9 % IV SOLN
10.0000 mg | Freq: Once | INTRAVENOUS | Status: AC
  Administered 2021-12-13: 10 mg via INTRAVENOUS
  Filled 2021-12-13: qty 10

## 2021-12-13 MED ORDER — LEUCOVORIN CALCIUM INJECTION 350 MG
400.0000 mg/m2 | Freq: Once | INTRAVENOUS | Status: AC
  Administered 2021-12-13: 1020 mg via INTRAVENOUS
  Filled 2021-12-13: qty 51

## 2021-12-13 MED ORDER — IRINOTECAN HCL CHEMO INJECTION 100 MG/5ML
180.0000 mg/m2 | Freq: Once | INTRAVENOUS | Status: AC
  Administered 2021-12-13: 460 mg via INTRAVENOUS
  Filled 2021-12-13: qty 15

## 2021-12-13 MED ORDER — PALONOSETRON HCL INJECTION 0.25 MG/5ML
0.2500 mg | Freq: Once | INTRAVENOUS | Status: AC
  Administered 2021-12-13: 0.25 mg via INTRAVENOUS
  Filled 2021-12-13: qty 5

## 2021-12-13 NOTE — Patient Instructions (Signed)
Fluorouracil Injection What is this medication? FLUOROURACIL (flure oh YOOR a sil) treats some types of cancer. It works by slowing down the growth of cancer cells. This medicine may be used for other purposes; ask your health care provider or pharmacist if you have questions. COMMON BRAND NAME(S): Adrucil What should I tell my care team before I take this medication? They need to know if you have any of these conditions: Blood disorders Dihydropyrimidine dehydrogenase (DPD) deficiency Infection, such as chickenpox, cold sores, herpes Kidney disease Liver disease Poor nutrition Recent or ongoing radiation therapy An unusual or allergic reaction to fluorouracil, other medications, foods, dyes, or preservatives If you or your partner are pregnant or trying to get pregnant Breast-feeding How should I use this medication? This medication is injected into a vein. It is administered by your care team in a hospital or clinic setting. Talk to your care team about the use of this medication in children. Special care may be needed. Overdosage: If you think you have taken too much of this medicine contact a poison control center or emergency room at once. NOTE: This medicine is only for you. Do not share this medicine with others. What if I miss a dose? Keep appointments for follow-up doses. It is important not to miss your dose. Call your care team if you are unable to keep an appointment. What may interact with this medication? Do not take this medication with any of the following: Live virus vaccines This medication may also interact with the following: Medications that treat or prevent blood clots, such as warfarin, enoxaparin, dalteparin This list may not describe all possible interactions. Give your health care provider a list of all the medicines, herbs, non-prescription drugs, or dietary supplements you use. Also tell them if you smoke, drink alcohol, or use illegal drugs. Some items may  interact with your medicine. What should I watch for while using this medication? Your condition will be monitored carefully while you are receiving this medication. This medication may make you feel generally unwell. This is not uncommon as chemotherapy can affect healthy cells as well as cancer cells. Report any side effects. Continue your course of treatment even though you feel ill unless your care team tells you to stop. In some cases, you may be given additional medications to help with side effects. Follow all directions for their use. This medication may increase your risk of getting an infection. Call your care team for advice if you get a fever, chills, sore throat, or other symptoms of a cold or flu. Do not treat yourself. Try to avoid being around people who are sick. This medication may increase your risk to bruise or bleed. Call your care team if you notice any unusual bleeding. Be careful brushing or flossing your teeth or using a toothpick because you may get an infection or bleed more easily. If you have any dental work done, tell your dentist you are receiving this medication. Avoid taking medications that contain aspirin, acetaminophen, ibuprofen, naproxen, or ketoprofen unless instructed by your care team. These medications may hide a fever. Do not treat diarrhea with over the counter products. Contact your care team if you have diarrhea that lasts more than 2 days or if it is severe and watery. This medication can make you more sensitive to the sun. Keep out of the sun. If you cannot avoid being in the sun, wear protective clothing and sunscreen. Do not use sun lamps, tanning beds, or tanning booths. Talk to   your care team if you or your partner wish to become pregnant or think you might be pregnant. This medication can cause serious birth defects if taken during pregnancy and for 3 months after the last dose. A reliable form of contraception is recommended while taking this  medication and for 3 months after the last dose. Talk to your care team about effective forms of contraception. Do not father a child while taking this medication and for 3 months after the last dose. Use a condom while having sex during this time period. Do not breastfeed while taking this medication. This medication may cause infertility. Talk to your care team if you are concerned about your fertility. What side effects may I notice from receiving this medication? Side effects that you should report to your care team as soon as possible: Allergic reactions--skin rash, itching, hives, swelling of the face, lips, tongue, or throat Heart attack--pain or tightness in the chest, shoulders, arms, or jaw, nausea, shortness of breath, cold or clammy skin, feeling faint or lightheaded Heart failure--shortness of breath, swelling of the ankles, feet, or hands, sudden weight gain, unusual weakness or fatigue Heart rhythm changes--fast or irregular heartbeat, dizziness, feeling faint or lightheaded, chest pain, trouble breathing High ammonia level--unusual weakness or fatigue, confusion, loss of appetite, nausea, vomiting, seizures Infection--fever, chills, cough, sore throat, wounds that don't heal, pain or trouble when passing urine, general feeling of discomfort or being unwell Low red blood cell level--unusual weakness or fatigue, dizziness, headache, trouble breathing Pain, tingling, or numbness in the hands or feet, muscle weakness, change in vision, confusion or trouble speaking, loss of balance or coordination, trouble walking, seizures Redness, swelling, and blistering of the skin over hands and feet Severe or prolonged diarrhea Unusual bruising or bleeding Side effects that usually do not require medical attention (report to your care team if they continue or are bothersome): Dry skin Headache Increased tears Nausea Pain, redness, or swelling with sores inside the mouth or throat Sensitivity  to light Vomiting This list may not describe all possible side effects. Call your doctor for medical advice about side effects. You may report side effects to FDA at 1-800-FDA-1088. Where should I keep my medication? This medication is given in a hospital or clinic. It will not be stored at home. NOTE: This sheet is a summary. It may not cover all possible information. If you have questions about this medicine, talk to your doctor, pharmacist, or health care provider.  2023 Elsevier/Gold Standard (2021-08-30 00:00:00) Leucovorin Injection What is this medication? LEUCOVORIN (loo koe VOR in) prevents side effects from certain medications, such as methotrexate. It works by increasing folate levels. This helps protect healthy cells in your body. It may also be used to treat anemia caused by low levels of folate. It can also be used with fluorouracil, a type of chemotherapy, to treat colorectal cancer. It works by increasing the effects of fluorouracil in the body. This medicine may be used for other purposes; ask your health care provider or pharmacist if you have questions. What should I tell my care team before I take this medication? They need to know if you have any of these conditions: Anemia from low levels of vitamin B12 in the blood An unusual or allergic reaction to leucovorin, folic acid, other medications, foods, dyes, or preservatives Pregnant or trying to get pregnant Breastfeeding How should I use this medication? This medication is injected into a vein or a muscle. It is given by your care team  in a hospital or clinic setting. Talk to your care team about the use of this medication in children. Special care may be needed. Overdosage: If you think you have taken too much of this medicine contact a poison control center or emergency room at once. NOTE: This medicine is only for you. Do not share this medicine with others. What if I miss a dose? Keep appointments for follow-up doses.  It is important not to miss your dose. Call your care team if you are unable to keep an appointment. What may interact with this medication? Capecitabine Fluorouracil Phenobarbital Phenytoin Primidone Trimethoprim;sulfamethoxazole This list may not describe all possible interactions. Give your health care provider a list of all the medicines, herbs, non-prescription drugs, or dietary supplements you use. Also tell them if you smoke, drink alcohol, or use illegal drugs. Some items may interact with your medicine. What should I watch for while using this medication? Your condition will be monitored carefully while you are receiving this medication. This medication may increase the side effects of 5-fluorouracil. Tell your care team if you have diarrhea or mouth sores that do not get better or that get worse. What side effects may I notice from receiving this medication? Side effects that you should report to your care team as soon as possible: Allergic reactions--skin rash, itching, hives, swelling of the face, lips, tongue, or throat This list may not describe all possible side effects. Call your doctor for medical advice about side effects. You may report side effects to FDA at 1-800-FDA-1088. Where should I keep my medication? This medication is given in a hospital or clinic. It will not be stored at home. NOTE: This sheet is a summary. It may not cover all possible information. If you have questions about this medicine, talk to your doctor, pharmacist, or health care provider.  2023 Elsevier/Gold Standard (2021-09-02 00:00:00)

## 2021-12-14 ENCOUNTER — Telehealth: Payer: Self-pay

## 2021-12-14 MED ORDER — PALONOSETRON HCL INJECTION 0.25 MG/5ML
INTRAVENOUS | Status: AC
  Filled 2021-12-14: qty 5

## 2021-12-14 MED ORDER — ATROPINE SULFATE 1 MG/ML IV SOLN
INTRAVENOUS | Status: AC
  Filled 2021-12-14: qty 1

## 2021-12-14 NOTE — Telephone Encounter (Signed)
I spoke with pt's wife,Eduardo Hester. She states that Eduardo Hester did ok after infusion. His biggest complaint is the pump. No N/V, mouth sores, diarrhea, fever, and skin rashes/itching. I reminded pt's wife of the importance of calling us if his temp was to rise to 100.4. She verbalized understanding.

## 2021-12-15 ENCOUNTER — Inpatient Hospital Stay: Payer: BC Managed Care – PPO

## 2021-12-15 VITALS — BP 135/91 | HR 97 | Temp 98.0°F | Resp 18 | Ht 78.0 in | Wt 261.0 lb

## 2021-12-15 DIAGNOSIS — C787 Secondary malignant neoplasm of liver and intrahepatic bile duct: Secondary | ICD-10-CM | POA: Diagnosis not present

## 2021-12-15 DIAGNOSIS — Z5112 Encounter for antineoplastic immunotherapy: Secondary | ICD-10-CM | POA: Diagnosis not present

## 2021-12-15 DIAGNOSIS — Z5111 Encounter for antineoplastic chemotherapy: Secondary | ICD-10-CM | POA: Diagnosis not present

## 2021-12-15 DIAGNOSIS — Z452 Encounter for adjustment and management of vascular access device: Secondary | ICD-10-CM | POA: Diagnosis not present

## 2021-12-15 DIAGNOSIS — C187 Malignant neoplasm of sigmoid colon: Secondary | ICD-10-CM

## 2021-12-15 DIAGNOSIS — D509 Iron deficiency anemia, unspecified: Secondary | ICD-10-CM | POA: Diagnosis not present

## 2021-12-15 MED ORDER — HEPARIN SOD (PORK) LOCK FLUSH 100 UNIT/ML IV SOLN
500.0000 [IU] | Freq: Once | INTRAVENOUS | Status: AC | PRN
  Administered 2021-12-15: 500 [IU]

## 2021-12-15 MED ORDER — SODIUM CHLORIDE 0.9% FLUSH
10.0000 mL | INTRAVENOUS | Status: DC | PRN
  Administered 2021-12-15: 10 mL

## 2021-12-15 NOTE — Patient Instructions (Signed)
Fluorouracil Injection What is this medication? FLUOROURACIL (flure oh YOOR a sil) treats some types of cancer. It works by slowing down the growth of cancer cells. This medicine may be used for other purposes; ask your health care provider or pharmacist if you have questions. COMMON BRAND NAME(S): Adrucil What should I tell my care team before I take this medication? They need to know if you have any of these conditions: Blood disorders Dihydropyrimidine dehydrogenase (DPD) deficiency Infection, such as chickenpox, cold sores, herpes Kidney disease Liver disease Poor nutrition Recent or ongoing radiation therapy An unusual or allergic reaction to fluorouracil, other medications, foods, dyes, or preservatives If you or your partner are pregnant or trying to get pregnant Breast-feeding How should I use this medication? This medication is injected into a vein. It is administered by your care team in a hospital or clinic setting. Talk to your care team about the use of this medication in children. Special care may be needed. Overdosage: If you think you have taken too much of this medicine contact a poison control center or emergency room at once. NOTE: This medicine is only for you. Do not share this medicine with others. What if I miss a dose? Keep appointments for follow-up doses. It is important not to miss your dose. Call your care team if you are unable to keep an appointment. What may interact with this medication? Do not take this medication with any of the following: Live virus vaccines This medication may also interact with the following: Medications that treat or prevent blood clots, such as warfarin, enoxaparin, dalteparin This list may not describe all possible interactions. Give your health care provider a list of all the medicines, herbs, non-prescription drugs, or dietary supplements you use. Also tell them if you smoke, drink alcohol, or use illegal drugs. Some items may  interact with your medicine. What should I watch for while using this medication? Your condition will be monitored carefully while you are receiving this medication. This medication may make you feel generally unwell. This is not uncommon as chemotherapy can affect healthy cells as well as cancer cells. Report any side effects. Continue your course of treatment even though you feel ill unless your care team tells you to stop. In some cases, you may be given additional medications to help with side effects. Follow all directions for their use. This medication may increase your risk of getting an infection. Call your care team for advice if you get a fever, chills, sore throat, or other symptoms of a cold or flu. Do not treat yourself. Try to avoid being around people who are sick. This medication may increase your risk to bruise or bleed. Call your care team if you notice any unusual bleeding. Be careful brushing or flossing your teeth or using a toothpick because you may get an infection or bleed more easily. If you have any dental work done, tell your dentist you are receiving this medication. Avoid taking medications that contain aspirin, acetaminophen, ibuprofen, naproxen, or ketoprofen unless instructed by your care team. These medications may hide a fever. Do not treat diarrhea with over the counter products. Contact your care team if you have diarrhea that lasts more than 2 days or if it is severe and watery. This medication can make you more sensitive to the sun. Keep out of the sun. If you cannot avoid being in the sun, wear protective clothing and sunscreen. Do not use sun lamps, tanning beds, or tanning booths. Talk to   your care team if you or your partner wish to become pregnant or think you might be pregnant. This medication can cause serious birth defects if taken during pregnancy and for 3 months after the last dose. A reliable form of contraception is recommended while taking this  medication and for 3 months after the last dose. Talk to your care team about effective forms of contraception. Do not father a child while taking this medication and for 3 months after the last dose. Use a condom while having sex during this time period. Do not breastfeed while taking this medication. This medication may cause infertility. Talk to your care team if you are concerned about your fertility. What side effects may I notice from receiving this medication? Side effects that you should report to your care team as soon as possible: Allergic reactions--skin rash, itching, hives, swelling of the face, lips, tongue, or throat Heart attack--pain or tightness in the chest, shoulders, arms, or jaw, nausea, shortness of breath, cold or clammy skin, feeling faint or lightheaded Heart failure--shortness of breath, swelling of the ankles, feet, or hands, sudden weight gain, unusual weakness or fatigue Heart rhythm changes--fast or irregular heartbeat, dizziness, feeling faint or lightheaded, chest pain, trouble breathing High ammonia level--unusual weakness or fatigue, confusion, loss of appetite, nausea, vomiting, seizures Infection--fever, chills, cough, sore throat, wounds that don't heal, pain or trouble when passing urine, general feeling of discomfort or being unwell Low red blood cell level--unusual weakness or fatigue, dizziness, headache, trouble breathing Pain, tingling, or numbness in the hands or feet, muscle weakness, change in vision, confusion or trouble speaking, loss of balance or coordination, trouble walking, seizures Redness, swelling, and blistering of the skin over hands and feet Severe or prolonged diarrhea Unusual bruising or bleeding Side effects that usually do not require medical attention (report to your care team if they continue or are bothersome): Dry skin Headache Increased tears Nausea Pain, redness, or swelling with sores inside the mouth or throat Sensitivity  to light Vomiting This list may not describe all possible side effects. Call your doctor for medical advice about side effects. You may report side effects to FDA at 1-800-FDA-1088. Where should I keep my medication? This medication is given in a hospital or clinic. It will not be stored at home. NOTE: This sheet is a summary. It may not cover all possible information. If you have questions about this medicine, talk to your doctor, pharmacist, or health care provider.  2023 Elsevier/Gold Standard (2021-08-30 00:00:00)  

## 2021-12-20 NOTE — Progress Notes (Signed)
..  Pharmacist Chemotherapy Monitoring - Initial Assessment    Anticipated start date: 12/13/21  The following has been reviewed per standard work regarding the patient's treatment regimen: The patient's diagnosis, treatment plan and drug doses, and organ/hematologic function Lab orders and baseline tests specific to treatment regimen  The treatment plan start date, drug sequencing, and pre-medications Prior authorization status  Patient's documented medication list, including drug-drug interaction screen and prescriptions for anti-emetics and supportive care specific to the treatment regimen The drug concentrations, fluid compatibility, administration routes, and timing of the medications to be used The patient's access for treatment and lifetime cumulative dose history, if applicable  The patient's medication allergies and previous infusion related reactions, if applicable   Changes made to treatment plan:  N/A  Follow up needed:  N/A   Juanetta Beets, [facility], 12/13/2021  11:19 AM

## 2021-12-23 ENCOUNTER — Inpatient Hospital Stay: Payer: BC Managed Care – PPO

## 2021-12-23 ENCOUNTER — Encounter: Payer: Self-pay | Admitting: Hematology and Oncology

## 2021-12-23 ENCOUNTER — Inpatient Hospital Stay (HOSPITAL_BASED_OUTPATIENT_CLINIC_OR_DEPARTMENT_OTHER): Payer: BC Managed Care – PPO | Admitting: Hematology and Oncology

## 2021-12-23 DIAGNOSIS — C187 Malignant neoplasm of sigmoid colon: Secondary | ICD-10-CM

## 2021-12-23 DIAGNOSIS — Z5111 Encounter for antineoplastic chemotherapy: Secondary | ICD-10-CM | POA: Diagnosis not present

## 2021-12-23 DIAGNOSIS — C787 Secondary malignant neoplasm of liver and intrahepatic bile duct: Secondary | ICD-10-CM

## 2021-12-23 DIAGNOSIS — D509 Iron deficiency anemia, unspecified: Secondary | ICD-10-CM | POA: Diagnosis not present

## 2021-12-23 DIAGNOSIS — Z5112 Encounter for antineoplastic immunotherapy: Secondary | ICD-10-CM | POA: Diagnosis not present

## 2021-12-23 DIAGNOSIS — D500 Iron deficiency anemia secondary to blood loss (chronic): Secondary | ICD-10-CM

## 2021-12-23 DIAGNOSIS — Z452 Encounter for adjustment and management of vascular access device: Secondary | ICD-10-CM | POA: Diagnosis not present

## 2021-12-23 LAB — CBC AND DIFFERENTIAL
HCT: 34 — AB (ref 41–53)
Hemoglobin: 10.7 — AB (ref 13.5–17.5)
Neutrophils Absolute: 2.93
Platelets: 296 10*3/uL (ref 150–400)
WBC: 4.5

## 2021-12-23 LAB — HEPATIC FUNCTION PANEL
AST: 25 (ref 14–40)
Alkaline Phosphatase: 91 (ref 25–125)
Bilirubin, Total: 0.4

## 2021-12-23 LAB — BASIC METABOLIC PANEL
BUN: 10 (ref 4–21)
CO2: 30 — AB (ref 13–22)
Chloride: 104 (ref 99–108)
Creatinine: 0.9 (ref 0.6–1.3)
Glucose: 108
Potassium: 4.3 mEq/L (ref 3.5–5.1)
Sodium: 138 (ref 137–147)

## 2021-12-23 LAB — COMPREHENSIVE METABOLIC PANEL
Albumin: 3.8 (ref 3.5–5.0)
Calcium: 9.2 (ref 8.7–10.7)

## 2021-12-23 LAB — TOTAL PROTEIN, URINE DIPSTICK: Protein, ur: NEGATIVE mg/dL

## 2021-12-23 LAB — CBC: RBC: 4.35 (ref 3.87–5.11)

## 2021-12-23 MED ORDER — CEPHALEXIN 500 MG PO CAPS: 500.0000 mg | ORAL_CAPSULE | Freq: Two times a day (BID) | ORAL | 0 refills | Status: DC

## 2021-12-23 NOTE — Assessment & Plan Note (Addendum)
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 He had ascites and peritoneal nodules in 2018 with negative biopsy and negative fluid cytology.  He was taken for exploratory laparotomy with removal of the nodule and this was confirmed to be benign.

## 2021-12-23 NOTE — Addendum Note (Signed)
Addended by: Dayton Scrape on: 12/23/2021 04:48 PM   Modules accepted: Orders

## 2021-12-23 NOTE — Progress Notes (Signed)
Patient Care Team: Eduardo Sheriff, MD as PCP - General (Family Medicine) Eduardo Kaplan, MD as Consulting Physician (Oncology)  Clinic Day:  12/23/2021  Referring physician: Angelina Sheriff, MD  ASSESSMENT & PLAN:   Assessment & Plan: Colon cancer [facility]) Stage IIIB colon cancer diagnosed in September of 2017 and treated with surgical resection followed by 6 months of adjuvant chemotherapy with FOLFOX. He had ascites and peritoneal nodules in 2018 with negative biopsy and negative fluid cytology.  He was taken for exploratory laparotomy with removal of the nodule and this was confirmed to be benign.  Malignant neoplasm metastatic to liver [facility]) Liver metastasis confirmed in January of 2021, which was resected in February, and was larger than it appeared on imaging, at 4.7 cm at the time of resection.  The margins are close but seem to be clear.  One margin is uncertain and another is less than 1 mm on the anterior aspect.  We discussed the risks and benefits of chemotherapy versus observation and decided to go with surveillance at this time. Recurrent liver metastasis which was once again resected, and found to be a 4.5 cm grade 2 adenocarcinoma consistent with his colon primary.  There was 1 negative node but margins were close.  This was attached to the diaphragm but the tissue from the diaphragm is benign.  We do recommend chemotherapy with FOLFIRI and Avastin for at least 6 months. He will proceed with cycle 2 next week. We will see him back in 2 weeks for repeat evaluation.  Iron deficiency anemia due to chronic blood loss  Iron deficiency anemia.  We will check iron studies as well as B12 and folate but we think he would benefit from iron supplement.    The patient understands the plans discussed today and is in agreement with them.  He knows to contact our office if he develops concerns prior to his next appointment.     Eduardo Ped, NP  E[facility]ardo [facility]ester [address] [place] [address] Dept: [phone] Dept Fax: [phone]   No orders of the defined types were placed in this encounter.     CHIEF COMPLAINT:  CC: A 51 year old male with history of colorectal cancer here for 2 week evaluation  Current Treatment:  FOLFIRI/ Bevacizumab  INTERVAL HISTORY:  Eduardo Hester is here today for repeat clinical assessment. He denies fevers or chills. He denies pain. His appetite is good. His weight has been stable.  I have reviewed the past medical history, past surgical history, social history and family history with the patient and they are unchanged from previous note.  ALLERGIES:  has No Known Allergies.  MEDICATIONS:  Current Outpatient Medications  Medication Sig Dispense Refill   Ascorbic Acid (VITAMIN C PO) Take 1 tablet by mouth daily.     ascorbic acid (VITAMIN C) 1000 MG tablet Take by mouth.     benzonatate (TESSALON) 200 MG capsule Take 1 capsule (200 mg total) by mouth 3 (three) times daily as needed for cough. 20 capsule 1   Calcium Polycarbophil (FIBER) 625 MG TABS Take by mouth.     cetirizine (ZYRTEC) 10 MG tablet Take by mouth.     Cholecalciferol (VITAMIN D) 50 MCG (2000 UT) tablet Take by mouth.     Docusate Sodium (DSS) 100 MG CAPS Take 1 capsule by mouth daily.     ferrous sulfate 325 (65 FE) MG tablet Take 1  capsule by mouth every morning.     loperamide (IMODIUM) 2 MG capsule Take 1 capsule (2 mg total) by mouth as needed for diarrhea or loose stools. Take 2 at diarrhea onset , then 1 every 2hr until 12hrs with no BM. May take 2 every 4hrs at night. If diarrhea recurs repeat. 100 capsule 5   LORazepam (ATIVAN) 1 MG tablet Take by mouth.     Multiple Vitamin (MULTI-VITAMIN) tablet Take 1 tablet by mouth daily.     omeprazole (PRILOSEC) 20 MG capsule Take by mouth.     ondansetron (ZOFRAN) 8 MG tablet Take 1 tablet (8 mg total) by mouth 2 (two) times daily as  needed for refractory nausea / vomiting. Start on day 3 after chemotherapy. 30 tablet 1   ondansetron (ZOFRAN-ODT) 4 MG disintegrating tablet Take 4 mg by mouth every 8 (eight) hours as needed.     oxyCODONE (OXY IR/ROXICODONE) 5 MG immediate release tablet Take by mouth.     prochlorperazine (COMPAZINE) 10 MG tablet Take 1 tablet (10 mg total) by mouth every 6 (six) hours as needed (NAUSEA). 30 tablet 1   vitamin B-12 (CYANOCOBALAMIN) 250 MCG tablet Take by mouth.     zinc gluconate 50 MG tablet Take 50 mg by mouth daily.     No current facility-administered medications for this visit.   Facility-Administered Medications Ordered in Other Visits  Medication Dose Route Frequency Provider Last Rate Last Admin   atropine 1 MG/ML injection            palonosetron (ALOXI) 0.25 MG/5ML injection             HISTORY OF PRESENT ILLNESS:   Oncology History  Colon cancer ([place])  01/23/2016 Cancer Staging   Staging form: Colon and Rectum, AJCC 7th Edition - Clinical stage from 01/23/2016: Stage IIIB (T3, N2a, M0) - Signed by Eduardo Kaplan, MD on 05/03/2020 Staging comments: 3.5 cm MMR, MSI normal, No mutation of KRAS or BRAF, neg.genetics Rec'd 6 months adjuvant FOLFOX chemotherapy  02/02/2016 Initial Diagnosis   Colon cancer ([place])   12/13/2021 -  Chemotherapy   Patient is on Treatment Plan : COLORECTAL FOLFIRI / BEVACIZUMAB Q14D     Malignant neoplasm metastatic to liver ([place])  05/29/2019 Initial Diagnosis   Malignant neoplasm metastatic to liver ([place])   12/13/2021 -  Chemotherapy   Patient is on Treatment Plan : COLORECTAL FOLFIRI / BEVACIZUMAB Q14D         REVIEW OF SYSTEMS:   Constitutional: Denies fevers, chills or abnormal weight loss Eyes: Denies blurriness of vision Ears, nose, mouth, throat, and face: Denies mucositis or sore throat Respiratory: Denies cough, dyspnea or wheezes Cardiovascular: Denies palpitation, chest discomfort or lower extremity swelling Gastrointestinal:   Denies nausea, heartburn or change in bowel habits Skin: Denies abnormal skin rashes Lymphatics: Denies new lymphadenopathy or easy bruising Neurological:Denies numbness, tingling or new weaknesses Behavioral/Psych: Mood is stable, no new changes  All other systems were reviewed with the patient and are negative.   VITALS:  Blood pressure 125/87, pulse 93, temperature 98.2 F (36.8 C), temperature source Oral, resp. rate 20, height 6' 6"  (1.981 m), weight 260 lb 4.8 oz (118.1 kg), SpO2 98 %.  Wt Readings from Last 3 Encounters:  12/23/21 260 lb 4.8 oz (118.1 kg)  12/15/21 261 lb (118.4 kg)  12/13/21 261 lb (118.4 kg)    Body mass index is 30.08 kg/m.  Performance status (ECOG): 1 - Symptomatic but completely ambulatory  PHYSICAL EXAM:  GENERAL:alert, no distress and comfortable SKIN: skin color, texture, turgor are normal, no rashes or significant lesions EYES: normal, Conjunctiva are pink and non-injected, sclera clear OROPHARYNX:no exudate, no erythema and lips, buccal mucosa, and tongue normal  NECK: supple, thyroid normal size, non-tender, without nodularity LYMPH:  no palpable lymphadenopathy in the cervical, axillary or inguinal LUNGS: clear to auscultation and percussion with normal breathing effort HEART: regular rate & rhythm and no murmurs and no lower extremity edema ABDOMEN:abdomen soft, non-tender and normal bowel sounds Musculoskeletal:no cyanosis of digits and no clubbing  NEURO: alert & oriented x 3 with fluent speech, no focal motor/sensory deficits  LABORATORY DATA:  I have reviewed the data as listed    Component Value Date/Time   NA 134 (A) 11/21/2021 0000   K 4.2 11/21/2021 0000   CL 100 11/21/2021 0000   CO2 25 (A) 11/21/2021 0000   BUN 13 11/21/2021 0000   CREATININE 0.7 11/21/2021 0000   CALCIUM 8.5 (A) 11/21/2021 0000   ALBUMIN 3.3 (A) 11/21/2021 0000   AST 30 11/21/2021 0000   ALT 24 11/21/2021 0000   ALKPHOS 95 11/21/2021 0000    No  results found for: "SPEP", "UPEP"  Lab Results  Component Value Date   WBC 9.0 11/21/2021   NEUTROABS 6.75 11/21/2021   HGB 8.8 (A) 11/21/2021   HCT 27 (A) 11/21/2021   PLT 526 (A) 11/21/2021      Chemistry      Component Value Date/Time   NA 134 (A) 11/21/2021 0000   K 4.2 11/21/2021 0000   CL 100 11/21/2021 0000   CO2 25 (A) 11/21/2021 0000   BUN 13 11/21/2021 0000   CREATININE 0.7 11/21/2021 0000   GLU 112 11/21/2021 0000      Component Value Date/Time   CALCIUM 8.5 (A) 11/21/2021 0000   ALKPHOS 95 11/21/2021 0000   AST 30 11/21/2021 0000   ALT 24 11/21/2021 0000       RADIOGRAPHIC STUDIES: I have personally reviewed the radiological images as listed and agreed with the findings in the report. No results found.

## 2021-12-23 NOTE — Assessment & Plan Note (Addendum)
Liver metastasis confirmed in January of 2021, which was resected in February, and was larger than it appeared on imaging, at 4.7 cm at the time of resection.  The margins are close but seem to be clear.  One margin is uncertain and another is less than 1 mm on the anterior aspect.  We discussed the risks and benefits of chemotherapy versus observation and decided to go with surveillance at this time. Recurrent liver metastasis which was once again resected, and found to be a 4.5 cm grade 2 adenocarcinoma consistent with his colon primary.  There was 1 negative node but margins were close.  This was attached to the diaphragm but the tissue from the diaphragm is benign.  We do recommend chemotherapy with FOLFIRI and Avastin for at least 6 months. He will proceed with cycle 2 next week. We will see him back in 2 weeks for repeat evaluation.

## 2021-12-23 NOTE — Assessment & Plan Note (Addendum)
Iron deficiency anemia.  We will check iron studies as well as B12 and folate but we think he would benefit from iron supplement.

## 2021-12-24 ENCOUNTER — Other Ambulatory Visit: Payer: Self-pay

## 2021-12-26 MED FILL — Irinotecan HCl Inj 100 MG/5ML (20 MG/ML): INTRAVENOUS | Qty: 23 | Status: AC

## 2021-12-26 MED FILL — Leucovorin Calcium For Inj 350 MG: INTRAMUSCULAR | Qty: 51 | Status: AC

## 2021-12-26 MED FILL — Fluorouracil IV Soln 5 GM/100ML (50 MG/ML): INTRAVENOUS | Qty: 122 | Status: AC

## 2021-12-26 MED FILL — Fluorouracil IV Soln 2.5 GM/50ML (50 MG/ML): INTRAVENOUS | Qty: 20 | Status: AC

## 2021-12-26 MED FILL — Bevacizumab-awwb IV Soln 400 MG/16ML (For Infusion): INTRAVENOUS | Qty: 24 | Status: AC

## 2021-12-26 MED FILL — Dexamethasone Sodium Phosphate Inj 100 MG/10ML: INTRAMUSCULAR | Qty: 1 | Status: AC

## 2021-12-27 ENCOUNTER — Inpatient Hospital Stay: Payer: BC Managed Care – PPO

## 2021-12-27 VITALS — BP 133/74 | HR 81 | Temp 98.1°F | Resp 18 | Ht 78.0 in | Wt 265.0 lb

## 2021-12-27 DIAGNOSIS — Z452 Encounter for adjustment and management of vascular access device: Secondary | ICD-10-CM | POA: Diagnosis not present

## 2021-12-27 DIAGNOSIS — C787 Secondary malignant neoplasm of liver and intrahepatic bile duct: Secondary | ICD-10-CM

## 2021-12-27 DIAGNOSIS — C187 Malignant neoplasm of sigmoid colon: Secondary | ICD-10-CM | POA: Diagnosis not present

## 2021-12-27 DIAGNOSIS — Z5112 Encounter for antineoplastic immunotherapy: Secondary | ICD-10-CM | POA: Diagnosis not present

## 2021-12-27 DIAGNOSIS — C189 Malignant neoplasm of colon, unspecified: Secondary | ICD-10-CM | POA: Diagnosis not present

## 2021-12-27 DIAGNOSIS — Z5111 Encounter for antineoplastic chemotherapy: Secondary | ICD-10-CM | POA: Diagnosis not present

## 2021-12-27 DIAGNOSIS — D509 Iron deficiency anemia, unspecified: Secondary | ICD-10-CM | POA: Diagnosis not present

## 2021-12-27 MED ORDER — FLUOROURACIL CHEMO INJECTION 2.5 GM/50ML
400.0000 mg/m2 | Freq: Once | INTRAVENOUS | Status: AC
  Administered 2021-12-27: 1000 mg via INTRAVENOUS
  Filled 2021-12-27: qty 20

## 2021-12-27 MED ORDER — SODIUM CHLORIDE 0.9 % IV SOLN: Freq: Once | INTRAVENOUS | Status: AC

## 2021-12-27 MED ORDER — SODIUM CHLORIDE 0.9 % IV SOLN
2400.0000 mg/m2 | INTRAVENOUS | Status: DC
  Administered 2021-12-27: 6100 mg via INTRAVENOUS
  Filled 2021-12-27: qty 122

## 2021-12-27 MED ORDER — SODIUM CHLORIDE 0.9 % IV SOLN
5.0000 mg/kg | Freq: Once | INTRAVENOUS | Status: AC
  Administered 2021-12-27: 600 mg via INTRAVENOUS
  Filled 2021-12-27: qty 16

## 2021-12-27 MED ORDER — PALONOSETRON HCL INJECTION 0.25 MG/5ML
0.2500 mg | Freq: Once | INTRAVENOUS | Status: AC
  Administered 2021-12-27: 0.25 mg via INTRAVENOUS
  Filled 2021-12-27: qty 5

## 2021-12-27 MED ORDER — LEUCOVORIN CALCIUM INJECTION 350 MG
400.0000 mg/m2 | Freq: Once | INTRAVENOUS | Status: AC
  Administered 2021-12-27: 1020 mg via INTRAVENOUS
  Filled 2021-12-27: qty 51

## 2021-12-27 MED ORDER — SODIUM CHLORIDE 0.9 % IV SOLN
10.0000 mg | Freq: Once | INTRAVENOUS | Status: AC
  Administered 2021-12-27: 10 mg via INTRAVENOUS
  Filled 2021-12-27: qty 10

## 2021-12-27 MED ORDER — IRINOTECAN HCL CHEMO INJECTION 100 MG/5ML
180.0000 mg/m2 | Freq: Once | INTRAVENOUS | Status: AC
  Administered 2021-12-27: 460 mg via INTRAVENOUS
  Filled 2021-12-27: qty 15

## 2021-12-27 NOTE — Patient Instructions (Signed)
Leucovorin Injection What is this medication? LEUCOVORIN (loo koe VOR in) prevents side effects from certain medications, such as methotrexate. It works by increasing folate levels. This helps protect healthy cells in your body. It may also be used to treat anemia caused by low levels of folate. It can also be used with fluorouracil, a type of chemotherapy, to treat colorectal cancer. It works by increasing the effects of fluorouracil in the body. This medicine may be used for other purposes; ask your health care provider or pharmacist if you have questions. What should I tell my care team before I take this medication? They need to know if you have any of these conditions: Anemia from low levels of vitamin B12 in the blood An unusual or allergic reaction to leucovorin, folic acid, other medications, foods, dyes, or preservatives Pregnant or trying to get pregnant Breastfeeding How should I use this medication? This medication is injected into a vein or a muscle. It is given by your care team in a hospital or clinic setting. Talk to your care team about the use of this medication in children. Special care may be needed. Overdosage: If you think you have taken too much of this medicine contact a poison control center or emergency room at once. NOTE: This medicine is only for you. Do not share this medicine with others. What if I miss a dose? Keep appointments for follow-up doses. It is important not to miss your dose. Call your care team if you are unable to keep an appointment. What may interact with this medication? Capecitabine Fluorouracil Phenobarbital Phenytoin Primidone Trimethoprim;sulfamethoxazole This list may not describe all possible interactions. Give your health care provider a list of all the medicines, herbs, non-prescription drugs, or dietary supplements you use. Also tell them if you smoke, drink alcohol, or use illegal drugs. Some items may interact with your medicine. What  should I watch for while using this medication? Your condition will be monitored carefully while you are receiving this medication. This medication may increase the side effects of 5-fluorouracil. Tell your care team if you have diarrhea or mouth sores that do not get better or that get worse. What side effects may I notice from receiving this medication? Side effects that you should report to your care team as soon as possible: Allergic reactions--skin rash, itching, hives, swelling of the face, lips, tongue, or throat This list may not describe all possible side effects. Call your doctor for medical advice about side effects. You may report side effects to FDA at 1-800-FDA-1088. Where should I keep my medication? This medication is given in a hospital or clinic. It will not be stored at home. NOTE: This sheet is a summary. It may not cover all possible information. If you have questions about this medicine, talk to your doctor, pharmacist, or health care provider.  2023 Else[employer]/Gold Standard (2021-09-02 00:00:00) Fluorouracil Injection What is this medication? FLUOROURACIL (flure oh YOOR a sil) treats some types of cancer. It works by slowing down the growth of cancer cells. This medicine may be used for other purposes; ask your health care provider or pharmacist if you have questions. COMMON BRAND NAME(S): Adrucil What should I tell my care team before I take this medication? They need to know if you have any of these conditions: Blood disorders Dihydropyrimidine dehydrogenase (DPD) deficiency Infection, such as chickenpox, cold sores, herpes Kidney disease Liver disease Poor nutrition Recent or ongoing radiation therapy An unusual or allergic reaction to fluorouracil, other medications, foods, dyes,  or preservatives If you or your partner are pregnant or trying to get pregnant Breast-feeding How should I use this medication? This medication is injected into a vein. It is  administered by your care team in a hospital or clinic setting. Talk to your care team about the use of this medication in children. Special care may be needed. Overdosage: If you think you have taken too much of this medicine contact a poison control center or emergency room at once. NOTE: This medicine is only for you. Do not share this medicine with others. What if I miss a dose? Keep appointments for follow-up doses. It is important not to miss your dose. Call your care team if you are unable to keep an appointment. What may interact with this medication? Do not take this medication with any of the following: Live virus vaccines This medication may also interact with the following: Medications that treat or prevent blood clots, such as warfarin, enoxaparin, dalteparin This list may not describe all possible interactions. Give your health care provider a list of all the medicines, herbs, non-prescription drugs, or dietary supplements you use. Also tell them if you smoke, drink alcohol, or use illegal drugs. Some items may interact with your medicine. What should I watch for while using this medication? Your condition will be monitored carefully while you are receiving this medication. This medication may make you feel generally unwell. This is not uncommon as chemotherapy can affect healthy cells as well as cancer cells. Report any side effects. Continue your course of treatment even though you feel ill unless your care team tells you to stop. In some cases, you may be given additional medications to help with side effects. Follow all directions for their use. This medication may increase your risk of getting an infection. Call your care team for advice if you get a fever, chills, sore throat, or other symptoms of a cold or flu. Do not treat yourself. Try to avoid being around people who are sick. This medication may increase your risk to bruise or bleed. Call your care team if you notice any  unusual bleeding. Be careful brushing or flossing your teeth or using a toothpick because you may get an infection or bleed more easily. If you have any dental work done, tell your dentist you are receiving this medication. Avoid taking medications that contain aspirin, acetaminophen, ibuprofen, naproxen, or ketoprofen unless instructed by your care team. These medications may hide a fever. Do not treat diarrhea with over the counter products. Contact your care team if you have diarrhea that lasts more than 2 days or if it is severe and watery. This medication can make you more sensitive to the sun. Keep out of the sun. If you cannot avoid being in the sun, wear protective clothing and sunscreen. Do not use sun lamps, tanning beds, or tanning booths. Talk to your care team if you or your partner wish to become pregnant or think you might be pregnant. This medication can cause serious birth defects if taken during pregnancy and for 3 months after the last dose. A reliable form of contraception is recommended while taking this medication and for 3 months after the last dose. Talk to your care team about effective forms of contraception. Do not father a child while taking this medication and for 3 months after the last dose. Use a condom while having sex during this time period. Do not breastfeed while taking this medication. This medication may cause infertility. Talk to  your care team if you are concerned about your fertility. What side effects may I notice from receiving this medication? Side effects that you should report to your care team as soon as possible: Allergic reactions--skin rash, itching, hives, swelling of the face, lips, tongue, or throat Heart attack--pain or tightness in the chest, shoulders, arms, or jaw, nausea, shortness of breath, cold or clammy skin, feeling faint or lightheaded Heart failure--shortness of breath, swelling of the ankles, feet, or hands, sudden weight gain, unusual  weakness or fatigue Heart rhythm changes--fast or irregular heartbeat, dizziness, feeling faint or lightheaded, chest pain, trouble breathing High ammonia level--unusual weakness or fatigue, confusion, loss of appetite, nausea, vomiting, seizures Infection--fever, chills, cough, sore throat, wounds that don't heal, pain or trouble when passing urine, general feeling of discomfort or being unwell Low red blood cell level--unusual weakness or fatigue, dizziness, headache, trouble breathing Pain, tingling, or numbness in the hands or feet, muscle weakness, change in vision, confusion or trouble speaking, loss of balance or coordination, trouble walking, seizures Redness, swelling, and blistering of the skin over hands and feet Severe or prolonged diarrhea Unusual bruising or bleeding Side effects that usually do not require medical attention (report to your care team if they continue or are bothersome): Dry skin Headache Increased tears Nausea Pain, redness, or swelling with sores inside the mouth or throat Sensitivity to light Vomiting This list may not describe all possible side effects. Call your doctor for medical advice about side effects. You may report side effects to FDA at 1-800-FDA-1088. Where should I keep my medication? This medication is given in a hospital or clinic. It will not be stored at home. NOTE: This sheet is a summary. It may not cover all possible information. If you have questions about this medicine, talk to your doctor, pharmacist, or health care provider.  2023 Elsevier/Gold Standard (2021-08-30 00:00:00) Irinotecan Injection What is this medication? IRINOTECAN (ir in oh TEE kan) treats some types of cancer. It works by slowing down the growth of cancer cells. This medicine may be used for other purposes; ask your health care provider or pharmacist if you have questions. COMMON BRAND NAME(S): Camptosar What should I tell my care team before I take this  medication? They need to know if you have any of these conditions: Dehydration Diarrhea Infection, especially a viral infection, such as chickenpox, cold sores, herpes Liver disease Low blood cell levels (white cells, red cells, and platelets) Low levels of electrolytes, such as calcium, magnesium, or potassium in your blood Recent or ongoing radiation An unusual or allergic reaction to irinotecan, other medications, foods, dyes, or preservatives If you or your partner are pregnant or trying to get pregnant Breast-feeding How should I use this medication? This medication is injected into a vein. It is given by your care team in a hospital or clinic setting. Talk to your care team about the use of this medication in children. Special care may be needed. Overdosage: If you think you have taken too much of this medicine contact a poison control center or emergency room at once. NOTE: This medicine is only for you. Do not share this medicine with others. What if I miss a dose? Keep appointments for follow-up doses. It is important not to miss your dose. Call your care team if you are unable to keep an appointment. What may interact with this medication? Do not take this medication with any of the following: Cobicistat Itraconazole This medication may also interact with the following:  Certain antibiotics, such as clarithromycin, rifampin, rifabutin Certain antivirals for HIV or AIDS Certain medications for fungal infections, such as ketoconazole, posaconazole, voriconazole Certain medications for seizures, such as carbamazepine, phenobarbital, phenytoin Gemfibrozil Nefazodone St. John's wort This list may not describe all possible interactions. Give your health care provider a list of all the medicines, herbs, non-prescription drugs, or dietary supplements you use. Also tell them if you smoke, drink alcohol, or use illegal drugs. Some items may interact with your medicine. What should I  watch for while using this medication? Your condition will be monitored carefully while you are receiving this medication. You may need blood work while taking this medication. This medication may make you feel generally unwell. This is not uncommon as chemotherapy can affect healthy cells as well as cancer cells. Report any side effects. Continue your course of treatment even though you feel ill unless your care team tells you to stop. This medication can cause serious side effects. To reduce the risk, your care team may give you other medications to take before receiving this one. Be sure to follow the directions from your care team. This medication may affect your coordination, reaction time, or judgement. Do not drive or operate machinery until you know how this medication affects you. Sit up or stand slowly to reduce the risk of dizzy or fainting spells. Drinking alcohol with this medication can increase the risk of these side effects. This medication may increase your risk of getting an infection. Call your care team for advice if you get a fever, chills, sore throat, or other symptoms of a cold or flu. Do not treat yourself. Try to avoid being around people who are sick. Avoid taking medications that contain aspirin, acetaminophen, ibuprofen, naproxen, or ketoprofen unless instructed by your care team. These medications may hide a fever. This medication may increase your risk to bruise or bleed. Call your care team if you notice any unusual bleeding. Be careful brushing or flossing your teeth or using a toothpick because you may get an infection or bleed more easily. If you have any dental work done, tell your dentist you are receiving this medication. Talk to your care team if you or your partner are pregnant or think either of you might be pregnant. This medication can cause serious birth defects if taken during pregnancy and for 6 months after the last dose. You will need a negative pregnancy test  before starting this medication. Contraception is recommended while taking this medication and for 6 months after the last dose. Your care team can help you find the option that works for you. Do not father a child while taking this medication and for 3 months after the last dose. Use a condom for contraception during this time period. Do not breastfeed while taking this medication and for 7 days after the last dose. This medication may cause infertility. Talk to your care team if you are concerned about your fertility. What side effects may I notice from receiving this medication? Side effects that you should report to your care team as soon as possible: Allergic reactions--skin rash, itching, hives, swelling of the face, lips, tongue, or throat Dry cough, shortness of breath or trouble breathing Increased saliva or tears, increased sweating, stomach cramping, diarrhea, small pupils, unusual weakness or fatigue, slow heartbeat Infection--fever, chills, cough, sore throat, wounds that don't heal, pain or trouble when passing urine, general feeling of discomfort or being unwell Kidney injury--decrease in the amount of urine, swelling of the  ankles, hands, or feet Low red blood cell level--unusual weakness or fatigue, dizziness, headache, trouble breathing Severe or prolonged diarrhea Unusual bruising or bleeding Side effects that usually do not require medical attention (report to your care team if they continue or are bothersome): Constipation Diarrhea Hair loss Loss of appetite Nausea Stomach pain This list may not describe all possible side effects. Call your doctor for medical advice about side effects. You may report side effects to FDA at 1-800-FDA-1088. Where should I keep my medication? This medication is given in a hospital or clinic. It will not be stored at home. NOTE: This sheet is a summary. It may not cover all possible information. If you have questions about this medicine, talk  to your doctor, pharmacist, or health care provider.  2023 [employer]/Gold Standard (2021-09-01 00:00:00) Bevacizumab Injection What is this medication? BEVACIZUMAB (be va SIZ yoo mab) treats some types of cancer. It works by blocking a protein that causes cancer cells to grow and multiply. This helps to slow or stop the spread of cancer cells. It is a monoclonal antibody. This medicine may be used for other purposes; ask your health care provider or pharmacist if you have questions. COMMON BRAND NAME(S): Alymsys, Avastin, MVASI, Noah Charon What should I tell my care team before I take this medication? They need to know if you have any of these conditions: Blood clots Coughing up blood Having or recent surgery Heart failure High blood pressure History of a connection between 2 or more body parts that do not usually connect (fistula) History of a tear in your stomach or intestines Protein in your urine An unusual or allergic reaction to bevacizumab, other medications, foods, dyes, or preservatives Pregnant or trying to get pregnant Breast-feeding How should I use this medication? This medication is injected into a vein. It is given by your care team in a hospital or clinic setting. Talk to your care team the use of this medication in children. Special care may be needed. Overdosage: If you think you have taken too much of this medicine contact a poison control center or emergency room at once. NOTE: This medicine is only for you. Do not share this medicine with others. What if I miss a dose? Keep appointments for follow-up doses. It is important not to miss your dose. Call your care team if you are unable to keep an appointment. What may interact with this medication? Interactions are not expected. This list may not describe all possible interactions. Give your health care provider a list of all the medicines, herbs, non-prescription drugs, or dietary supplements you use. Also tell them if  you smoke, drink alcohol, or use illegal drugs. Some items may interact with your medicine. What should I watch for while using this medication? Your condition will be monitored carefully while you are receiving this medication. You may need blood work while taking this medication. This medication may make you feel generally unwell. This is not uncommon as chemotherapy can affect healthy cells as well as cancer cells. Report any side effects. Continue your course of treatment even though you feel ill unless your care team tells you to stop. This medication may increase your risk to bruise or bleed. Call your care team if you notice any unusual bleeding. Before having surgery, talk to your care team to make sure it is ok. This medication can increase the risk of poor healing of your surgical site or wound. You will need to stop this medication for 28 days before  surgery. After surgery, wait at least 28 days before restarting this medication. Make sure the surgical site or wound is healed enough before restarting this medication. Talk to your care team if questions. Talk to your care team if you may be pregnant. Serious birth defects can occur if you take this medication during pregnancy and for 6 months after the last dose. Contraception is recommended while taking this medication and for 6 months after the last dose. Your care team can help you find the option that works for you. Do not breastfeed while taking this medication and for 6 months after the last dose. This medication can cause infertility. Talk to your care team if you are concerned about your fertility. What side effects may I notice from receiving this medication? Side effects that you should report to your care team as soon as possible: Allergic reactions--skin rash, itching, hives, swelling of the face, lips, tongue, or throat Bleeding--bloody or black, tar-like stools, vomiting blood or brown material that looks like coffee grounds, red  or dark brown urine, small red or purple spots on skin, unusual bruising or bleeding Blood clot--pain, swelling, or warmth in the leg, shortness of breath, chest pain Heart attack--pain or tightness in the chest, shoulders, arms, or jaw, nausea, shortness of breath, cold or clammy skin, feeling faint or lightheaded Heart failure--shortness of breath, swelling of the ankles, feet, or hands, sudden weight gain, unusual weakness or fatigue Increase in blood pressure Infection--fever, chills, cough, sore throat, wounds that don't heal, pain or trouble when passing urine, general feeling of discomfort or being unwell Infusion reactions--chest pain, shortness of breath or trouble breathing, feeling faint or lightheaded Kidney injury--decrease in the amount of urine, swelling of the ankles, hands, or feet Stomach pain that is severe, does not go away, or gets worse Stroke--sudden numbness or weakness of the face, arm, or leg, trouble speaking, confusion, trouble walking, loss of balance or coordination, dizziness, severe headache, change in vision Sudden and severe headache, confusion, change in vision, seizures, which may be signs of posterior reversible encephalopathy syndrome (PRES) Side effects that usually do not require medical attention (report to your care team if they continue or are bothersome): Back pain Change in taste Diarrhea Dry skin Increased tears Nosebleed This list may not describe all possible side effects. Call your doctor for medical advice about side effects. You may report side effects to FDA at 1-800-FDA-1088. Where should I keep my medication? This medication is given in a hospital or clinic. It will not be stored at home. NOTE: This sheet is a summary. It may not cover all possible information. If you have questions about this medicine, talk to your doctor, pharmacist, or health care provider.  2023 [employer]/Gold Standard (2021-09-06 00:00:00)

## 2021-12-28 ENCOUNTER — Encounter: Payer: Self-pay | Admitting: Oncology

## 2021-12-29 ENCOUNTER — Inpatient Hospital Stay: Payer: BC Managed Care – PPO

## 2021-12-29 VITALS — BP 123/90 | HR 95 | Temp 98.7°F | Resp 18

## 2021-12-29 DIAGNOSIS — D509 Iron deficiency anemia, unspecified: Secondary | ICD-10-CM | POA: Diagnosis not present

## 2021-12-29 DIAGNOSIS — Z452 Encounter for adjustment and management of vascular access device: Secondary | ICD-10-CM | POA: Diagnosis not present

## 2021-12-29 DIAGNOSIS — C787 Secondary malignant neoplasm of liver and intrahepatic bile duct: Secondary | ICD-10-CM

## 2021-12-29 DIAGNOSIS — C187 Malignant neoplasm of sigmoid colon: Secondary | ICD-10-CM | POA: Diagnosis not present

## 2021-12-29 DIAGNOSIS — Z5111 Encounter for antineoplastic chemotherapy: Secondary | ICD-10-CM | POA: Diagnosis not present

## 2021-12-29 DIAGNOSIS — Z5112 Encounter for antineoplastic immunotherapy: Secondary | ICD-10-CM | POA: Diagnosis not present

## 2021-12-29 MED ORDER — HEPARIN SOD (PORK) LOCK FLUSH 100 UNIT/ML IV SOLN
500.0000 [IU] | Freq: Once | INTRAVENOUS | Status: AC | PRN
  Administered 2021-12-29: 500 [IU]

## 2021-12-29 MED ORDER — SODIUM CHLORIDE 0.9% FLUSH
10.0000 mL | INTRAVENOUS | Status: DC | PRN
  Administered 2021-12-29: 10 mL

## 2021-12-29 NOTE — Patient Instructions (Signed)
Fluorouracil Injection What is this medication? FLUOROURACIL (flure oh YOOR a sil) treats some types of cancer. It works by slowing down the growth of cancer cells. This medicine may be used for other purposes; ask your health care provider or pharmacist if you have questions. COMMON BRAND NAME(S): Adrucil What should I tell my care team before I take this medication? They need to know if you have any of these conditions: Blood disorders Dihydropyrimidine dehydrogenase (DPD) deficiency Infection, such as chickenpox, cold sores, herpes Kidney disease Liver disease Poor nutrition Recent or ongoing radiation therapy An unusual or allergic reaction to fluorouracil, other medications, foods, dyes, or preservatives If you or your partner are pregnant or trying to get pregnant Breast-feeding How should I use this medication? This medication is injected into a vein. It is administered by your care team in a hospital or clinic setting. Talk to your care team about the use of this medication in children. Special care may be needed. Overdosage: If you think you have taken too much of this medicine contact a poison control center or emergency room at once. NOTE: This medicine is only for you. Do not share this medicine with others. What if I miss a dose? Keep appointments for follow-up doses. It is important not to miss your dose. Call your care team if you are unable to keep an appointment. What may interact with this medication? Do not take this medication with any of the following: Live virus vaccines This medication may also interact with the following: Medications that treat or prevent blood clots, such as warfarin, enoxaparin, dalteparin This list may not describe all possible interactions. Give your health care provider a list of all the medicines, herbs, non-prescription drugs, or dietary supplements you use. Also tell them if you smoke, drink alcohol, or use illegal drugs. Some items may  interact with your medicine. What should I watch for while using this medication? Your condition will be monitored carefully while you are receiving this medication. This medication may make you feel generally unwell. This is not uncommon as chemotherapy can affect healthy cells as well as cancer cells. Report any side effects. Continue your course of treatment even though you feel ill unless your care team tells you to stop. In some cases, you may be given additional medications to help with side effects. Follow all directions for their use. This medication may increase your risk of getting an infection. Call your care team for advice if you get a fever, chills, sore throat, or other symptoms of a cold or flu. Do not treat yourself. Try to avoid being around people who are sick. This medication may increase your risk to bruise or bleed. Call your care team if you notice any unusual bleeding. Be careful brushing or flossing your teeth or using a toothpick because you may get an infection or bleed more easily. If you have any dental work done, tell your dentist you are receiving this medication. Avoid taking medications that contain aspirin, acetaminophen, ibuprofen, naproxen, or ketoprofen unless instructed by your care team. These medications may hide a fever. Do not treat diarrhea with over the counter products. Contact your care team if you have diarrhea that lasts more than 2 days or if it is severe and watery. This medication can make you more sensitive to the sun. Keep out of the sun. If you cannot avoid being in the sun, wear protective clothing and sunscreen. Do not use sun lamps, tanning beds, or tanning booths. Talk to   your care team if you or your partner wish to become pregnant or think you might be pregnant. This medication can cause serious birth defects if taken during pregnancy and for 3 months after the last dose. A reliable form of contraception is recommended while taking this  medication and for 3 months after the last dose. Talk to your care team about effective forms of contraception. Do not father a child while taking this medication and for 3 months after the last dose. Use a condom while having sex during this time period. Do not breastfeed while taking this medication. This medication may cause infertility. Talk to your care team if you are concerned about your fertility. What side effects may I notice from receiving this medication? Side effects that you should report to your care team as soon as possible: Allergic reactions--skin rash, itching, hives, swelling of the face, lips, tongue, or throat Heart attack--pain or tightness in the chest, shoulders, arms, or jaw, nausea, shortness of breath, cold or clammy skin, feeling faint or lightheaded Heart failure--shortness of breath, swelling of the ankles, feet, or hands, sudden weight gain, unusual weakness or fatigue Heart rhythm changes--fast or irregular heartbeat, dizziness, feeling faint or lightheaded, chest pain, trouble breathing High ammonia level--unusual weakness or fatigue, confusion, loss of appetite, nausea, vomiting, seizures Infection--fever, chills, cough, sore throat, wounds that don't heal, pain or trouble when passing urine, general feeling of discomfort or being unwell Low red blood cell level--unusual weakness or fatigue, dizziness, headache, trouble breathing Pain, tingling, or numbness in the hands or feet, muscle weakness, change in vision, confusion or trouble speaking, loss of balance or coordination, trouble walking, seizures Redness, swelling, and blistering of the skin over hands and feet Severe or prolonged diarrhea Unusual bruising or bleeding Side effects that usually do not require medical attention (report to your care team if they continue or are bothersome): Dry skin Headache Increased tears Nausea Pain, redness, or swelling with sores inside the mouth or throat Sensitivity  to light Vomiting This list may not describe all possible side effects. Call your doctor for medical advice about side effects. You may report side effects to FDA at 1-800-FDA-1088. Where should I keep my medication? This medication is given in a hospital or clinic. It will not be stored at home. NOTE: This sheet is a summary. It may not cover all possible information. If you have questions about this medicine, talk to your doctor, pharmacist, or health care provider.  2023 Elsevier/Gold Standard (2021-08-30 00:00:00)  

## 2022-01-05 NOTE — Progress Notes (Signed)
Patient Care Team: Angelina Sheriff, MD as PCP - General (Family Medicine) Derwood Kaplan, MD as Consulting Physician (Oncology)  Clinic Day:  01/06/22  Referring physician: Angelina Sheriff, MD  ASSESSMENT & PLAN:   Assessment & Plan: Colon cancer [facility]) Stage IIIB colon cancer diagnosed in September of 2017 and treated with surgical resection followed by 6 months of adjuvant chemotherapy with FOLFOX. He had ascites and peritoneal nodules in 2018 with negative biopsy and negative fluid cytology.  He was taken for exploratory laparotomy with removal of the nodule and this was confirmed to be benign.   Malignant neoplasm metastatic to liver [facility]) Liver metastasis confirmed in January of 2021, which was resected in February, and was larger than it appeared on imaging, at 4.7 cm at the time of resection.  The margins are close but seem to be clear.  One margin is uncertain and another is less than 1 mm on the anterior aspect.  We discussed the risks and benefits of chemotherapy versus observation and decided to go with surveillance at that time.   Recurrent liver metastasis  This was once again resected in June of 2023, and found to be a 4.5 cm grade 2 adenocarcinoma consistent with his colon primary.  There was 1 negative node but margins were close.  This was attached to the diaphragm but the tissue from the diaphragm is benign.  We do recommend chemotherapy with FOLFIRI and Avastin for at least 6 months. His KRAS mutation is negative.   Iron deficiency anemia due to chronic blood loss  Iron deficiency anemia diagnosed in August. He is on oral iron supplement.      We will proceed with his 3rd cycle of FOLFIRI chemotherapy next week.  I will refill his Ativan. He will return in 2 weeks with CBC and CMP. The patient understands the plans discussed today and is in agreement with them.  He knows to contact our office if he develops concerns prior to his next  appointment.     Derwood Kaplan, MD  [facility] AT [facility] [address] [place] [address] Dept: [phone] Dept Fax: [phone]   Orders Placed This Encounter  Procedures   CBC and differential    This external order was created through the Results Console.   CBC    This external order was created through the Results Console.   Basic metabolic panel    This external order was created through the Results Console.   Comprehensive metabolic panel    This external order was created through the Results Console.   Hepatic function panel    This external order was created through the Results Console.      CHIEF COMPLAINT:  CC: A 51 year old male with history of colorectal cancer here for 2 week evaluation  Current Treatment:  FOLFIRI/ Bevacizumab  INTERVAL HISTORY:  Eduardo Hester is here today for repeat clinical assessment.His wife has been very upset that he is not able to stay with her husband during treatment. He becomes quite anxious without her, and I will refill his Ativan. He is due for his 3rd cycle next week.  He denies fevers or chills. He denies pain. His appetite is good. His weight has been stable. CBC reveals his hemoglobin has increased from 10.7 to 11.5 with the oral iron supplement. MCV is 77. White count is down from 4.5 to 3.7 but ANC is 1.96. CMP reveals a creatinine of  1.3, otherwise normal. I encouraged him to drink more fluids. Of note, his KRAS is negative for mutation.  I have reviewed the past medical history, past surgical history, social history and family history with the patient and they are unchanged from previous note.  ALLERGIES:  has No Known Allergies.  MEDICATIONS:  Current Outpatient Medications  Medication Sig Dispense Refill   ascorbic acid (VITAMIN C) 1000 MG tablet Take by mouth.     benzonatate (TESSALON) 200 MG capsule Take 1 capsule (200 mg total) by mouth 3 (three) times  daily as needed for cough. 20 capsule 1   Calcium Polycarbophil (FIBER) 625 MG TABS Take by mouth.     cetirizine (ZYRTEC) 10 MG tablet Take by mouth.     Cholecalciferol (VITAMIN D) 50 MCG (2000 UT) tablet Take by mouth.     Docusate Sodium (DSS) 100 MG CAPS Take 1 capsule by mouth daily.     ferrous sulfate 325 (65 FE) MG tablet Take 1 capsule by mouth every morning.     loperamide (IMODIUM) 2 MG capsule Take 1 capsule (2 mg total) by mouth as needed for diarrhea or loose stools. Take 2 at diarrhea onset , then 1 every 2hr until 12hrs with no BM. May take 2 every 4hrs at night. If diarrhea recurs repeat. 100 capsule 5   LORazepam (ATIVAN) 1 MG tablet Take 1 tablet (1 mg total) by mouth every 6 (six) hours as needed for anxiety or sleep. 100 tablet 0   Multiple Vitamin (MULTI-VITAMIN) tablet Take 1 tablet by mouth daily.     omeprazole (PRILOSEC) 20 MG capsule Take by mouth.     ondansetron (ZOFRAN) 8 MG tablet Take 1 tablet (8 mg total) by mouth 2 (two) times daily as needed for refractory nausea / vomiting. Start on day 3 after chemotherapy. 30 tablet 1   ondansetron (ZOFRAN-ODT) 4 MG disintegrating tablet Take 4 mg by mouth every 8 (eight) hours as needed.     oxyCODONE (OXY IR/ROXICODONE) 5 MG immediate release tablet Take by mouth.     Probiotic Product (PROBIOTIC BLEND PO) Take 1 tablet by mouth daily.     prochlorperazine (COMPAZINE) 10 MG tablet Take 1 tablet (10 mg total) by mouth every 6 (six) hours as needed (NAUSEA). 30 tablet 1   vitamin B-12 (CYANOCOBALAMIN) 250 MCG tablet Take by mouth.     zinc gluconate 50 MG tablet Take 50 mg by mouth daily.     No current facility-administered medications for this visit.   Facility-Administered Medications Ordered in Other Visits  Medication Dose Route Frequency Provider Last Rate Last Admin   atropine 1 MG/ML injection            palonosetron (ALOXI) 0.25 MG/5ML injection             HISTORY OF PRESENT ILLNESS:   Oncology History   Colon cancer ([place])  01/23/2016 Cancer Staging   Staging form: Colon and Rectum, AJCC 7th Edition - Clinical stage from 01/23/2016: Stage IIIB (T3, N2a, M0) - Signed by Derwood Kaplan, MD on 05/03/2020 Staging comments: 3.5 cm MMR, MSI normal, No mutation of KRAS or BRAF, neg.genetics Rec'd 6 months adjuvant FOLFOX chemotherapy  02/02/2016 Initial Diagnosis   Colon cancer ([place])   12/13/2021 - 12/29/2021 Chemotherapy   Patient is on Treatment Plan : COLORECTAL FOLFIRI / BEVACIZUMAB Q14D     12/13/2021 -  Chemotherapy   Patient is on Treatment Plan : COLORECTAL FOLFIRI + Bevacizumab q14d  Malignant neoplasm metastatic to liver ([place])  05/29/2019 Initial Diagnosis   Malignant neoplasm metastatic to liver [facility])   12/13/2021 - 12/29/2021 Chemotherapy   Patient is on Treatment Plan : COLORECTAL FOLFIRI / BEVACIZUMAB Q14D     12/13/2021 -  Chemotherapy   Patient is on Treatment Plan : COLORECTAL FOLFIRI + Bevacizumab q14d         REVIEW OF SYSTEMS:   Constitutional: Denies fevers, chills or abnormal weight loss Eyes: Denies blurriness of vision Ears, nose, mouth, throat, and face: Denies mucositis or sore throat Respiratory: Denies cough, dyspnea or wheezes Cardiovascular: Denies palpitation, chest discomfort or lower extremity swelling Gastrointestinal:  Denies nausea, heartburn or change in bowel habits Skin: Denies abnormal skin rashes Lymphatics: Denies new lymphadenopathy or easy bruising Neurological:Denies numbness, tingling or new weaknesses Behavioral/Psych: Mood is stable, no new changes  All other systems were reviewed with the patient and are negative.   VITALS:  Blood pressure (!) 152/97, pulse 92, temperature 98.2 F (36.8 C), temperature source Oral, resp. rate 18, height _0  (1.981 m), weight 263 lb 14.4 oz (119.7 kg), SpO2 98 %.  Wt Readings from Last 3 Encounters:  01/26/22 263 lb (119.3 kg)  01/24/22 263 lb (119.3 kg)  01/20/22 261 lb 3.2 oz (118.5 kg)     Body mass index is 30.5 kg/m.  Performance status (ECOG): 1 - Symptomatic but completely ambulatory  PHYSICAL EXAM:   GENERAL:alert, no distress and comfortable SKIN: skin color, texture, turgor are normal, no rashes or significant lesions EYES: normal, Conjunctiva are pink and non-injected, sclera clear OROPHARYNX:no exudate, no erythema and lips, buccal mucosa, and tongue normal  NECK: supple, thyroid normal size, non-tender, without nodularity LYMPH:  no palpable lymphadenopathy in the cervical, axillary or inguinal LUNGS: clear to auscultation and percussion with normal breathing effort HEART: regular rate & rhythm and no murmurs and no lower extremity edema ABDOMEN:abdomen soft, non-tender and normal bowel sounds Musculoskeletal:no cyanosis of digits and no clubbing  NEURO: alert & oriented x 3 with fluent speech, no focal motor/sensory deficits  LABORATORY DATA:  I have reviewed the data as listed    Component Value Date/Time   NA 140 01/20/2022 0000   K 3.9 01/20/2022 0000   CL 108 01/20/2022 0000   CO2 24 (A) 01/20/2022 0000   BUN 12 01/20/2022 0000   CREATININE 0.9 01/20/2022 0000   CALCIUM 9.3 01/20/2022 0000   ALBUMIN 3.9 01/20/2022 0000   AST 50 (A) 01/20/2022 0000   ALT 60 (A) 01/20/2022 0000   ALKPHOS 136 (A) 01/20/2022 0000    No results found for: "SPEP", "UPEP"  Lab Results  Component Value Date   WBC 4.1 01/20/2022   NEUTROABS 2.34 01/20/2022   HGB 12.0 (A) 01/20/2022   HCT 37 (A) 01/20/2022   MCV 78 (A) 01/20/2022   PLT 330 01/20/2022      Chemistry      Component Value Date/Time   NA 140 01/20/2022 0000   K 3.9 01/20/2022 0000   CL 108 01/20/2022 0000   CO2 24 (A) 01/20/2022 0000   BUN 12 01/20/2022 0000   CREATININE 0.9 01/20/2022 0000   GLU 125 01/20/2022 0000      Component Value Date/Time   CALCIUM 9.3 01/20/2022 0000   ALKPHOS 136 (A) 01/20/2022 0000   AST 50 (A) 01/20/2022 0000   ALT 60 (A) 01/20/2022 0000        RADIOGRAPHIC STUDIES: No results found.

## 2022-01-06 ENCOUNTER — Other Ambulatory Visit: Payer: Self-pay | Admitting: Pharmacist

## 2022-01-06 ENCOUNTER — Encounter: Payer: Self-pay | Admitting: Oncology

## 2022-01-06 ENCOUNTER — Inpatient Hospital Stay: Payer: BC Managed Care – PPO

## 2022-01-06 ENCOUNTER — Inpatient Hospital Stay: Payer: BC Managed Care – PPO | Attending: Oncology | Admitting: Oncology

## 2022-01-06 ENCOUNTER — Other Ambulatory Visit: Payer: Self-pay | Admitting: Oncology

## 2022-01-06 VITALS — BP 152/97 | HR 92 | Temp 98.2°F | Resp 18 | Ht 78.0 in | Wt 263.9 lb

## 2022-01-06 DIAGNOSIS — C787 Secondary malignant neoplasm of liver and intrahepatic bile duct: Secondary | ICD-10-CM

## 2022-01-06 DIAGNOSIS — K649 Unspecified hemorrhoids: Secondary | ICD-10-CM | POA: Diagnosis not present

## 2022-01-06 DIAGNOSIS — Z5111 Encounter for antineoplastic chemotherapy: Secondary | ICD-10-CM | POA: Insufficient documentation

## 2022-01-06 DIAGNOSIS — D500 Iron deficiency anemia secondary to blood loss (chronic): Secondary | ICD-10-CM

## 2022-01-06 DIAGNOSIS — F419 Anxiety disorder, unspecified: Secondary | ICD-10-CM | POA: Diagnosis not present

## 2022-01-06 DIAGNOSIS — Z9221 Personal history of antineoplastic chemotherapy: Secondary | ICD-10-CM | POA: Diagnosis not present

## 2022-01-06 DIAGNOSIS — C187 Malignant neoplasm of sigmoid colon: Secondary | ICD-10-CM | POA: Diagnosis not present

## 2022-01-06 DIAGNOSIS — Z5112 Encounter for antineoplastic immunotherapy: Secondary | ICD-10-CM | POA: Diagnosis not present

## 2022-01-06 DIAGNOSIS — C801 Malignant (primary) neoplasm, unspecified: Secondary | ICD-10-CM

## 2022-01-06 LAB — HEPATIC FUNCTION PANEL
ALT: 39 U/L (ref 10–40)
AST: 53 — AB (ref 14–40)
Alkaline Phosphatase: 106 (ref 25–125)
Bilirubin, Total: 0.4

## 2022-01-06 LAB — CBC AND DIFFERENTIAL
HCT: 36 — AB (ref 41–53)
Hemoglobin: 11.5 — AB (ref 13.5–17.5)
Neutrophils Absolute: 1.96
Platelets: 287 10*3/uL (ref 150–400)
WBC: 3.7

## 2022-01-06 LAB — BASIC METABOLIC PANEL
BUN: 17 (ref 4–21)
CO2: 31 — AB (ref 13–22)
Chloride: 102 (ref 99–108)
Creatinine: 1.3 (ref 0.6–1.3)
Glucose: 107
Potassium: 4.3 mEq/L (ref 3.5–5.1)
Sodium: 140 (ref 137–147)

## 2022-01-06 LAB — CBC: RBC: 4.75 (ref 3.87–5.11)

## 2022-01-06 LAB — COMPREHENSIVE METABOLIC PANEL
Albumin: 3.8 (ref 3.5–5.0)
Calcium: 9.4 (ref 8.7–10.7)

## 2022-01-06 LAB — TOTAL PROTEIN, URINE DIPSTICK: Protein, ur: NEGATIVE mg/dL

## 2022-01-06 MED ORDER — LORAZEPAM 1 MG PO TABS: 1.0000 mg | ORAL_TABLET | Freq: Four times a day (QID) | ORAL | 0 refills | Status: DC | PRN

## 2022-01-06 MED FILL — Leucovorin Calcium For Inj 350 MG: INTRAMUSCULAR | Qty: 51 | Status: AC

## 2022-01-06 MED FILL — Irinotecan HCl Inj 100 MG/5ML (20 MG/ML): INTRAVENOUS | Qty: 23 | Status: AC

## 2022-01-06 MED FILL — Dexamethasone Sodium Phosphate Inj 100 MG/10ML: INTRAMUSCULAR | Qty: 1 | Status: AC

## 2022-01-06 MED FILL — Bevacizumab-awwb IV Soln 400 MG/16ML (For Infusion): INTRAVENOUS | Qty: 24 | Status: AC

## 2022-01-06 MED FILL — Fluorouracil IV Soln 2.5 GM/50ML (50 MG/ML): INTRAVENOUS | Qty: 20 | Status: AC

## 2022-01-06 MED FILL — Fluorouracil IV Soln 5 GM/100ML (50 MG/ML): INTRAVENOUS | Qty: 122 | Status: AC

## 2022-01-07 ENCOUNTER — Other Ambulatory Visit: Payer: Self-pay

## 2022-01-07 DIAGNOSIS — Z79899 Other long term (current) drug therapy: Secondary | ICD-10-CM | POA: Diagnosis not present

## 2022-01-07 DIAGNOSIS — R531 Weakness: Secondary | ICD-10-CM | POA: Diagnosis not present

## 2022-01-07 DIAGNOSIS — R7881 Bacteremia: Secondary | ICD-10-CM | POA: Diagnosis not present

## 2022-01-07 DIAGNOSIS — C787 Secondary malignant neoplasm of liver and intrahepatic bile duct: Secondary | ICD-10-CM

## 2022-01-07 DIAGNOSIS — R7989 Other specified abnormal findings of blood chemistry: Secondary | ICD-10-CM | POA: Diagnosis not present

## 2022-01-07 DIAGNOSIS — D509 Iron deficiency anemia, unspecified: Secondary | ICD-10-CM | POA: Diagnosis not present

## 2022-01-07 DIAGNOSIS — Z20822 Contact with and (suspected) exposure to covid-19: Secondary | ICD-10-CM | POA: Diagnosis not present

## 2022-01-07 DIAGNOSIS — I10 Essential (primary) hypertension: Secondary | ICD-10-CM | POA: Diagnosis not present

## 2022-01-07 DIAGNOSIS — J9811 Atelectasis: Secondary | ICD-10-CM | POA: Diagnosis not present

## 2022-01-07 DIAGNOSIS — D84821 Immunodeficiency due to drugs: Secondary | ICD-10-CM | POA: Diagnosis not present

## 2022-01-07 DIAGNOSIS — A4189 Other specified sepsis: Secondary | ICD-10-CM | POA: Diagnosis not present

## 2022-01-07 DIAGNOSIS — R509 Fever, unspecified: Secondary | ICD-10-CM | POA: Diagnosis not present

## 2022-01-07 DIAGNOSIS — C187 Malignant neoplasm of sigmoid colon: Secondary | ICD-10-CM | POA: Diagnosis not present

## 2022-01-07 DIAGNOSIS — N281 Cyst of kidney, acquired: Secondary | ICD-10-CM | POA: Diagnosis not present

## 2022-01-07 DIAGNOSIS — R590 Localized enlarged lymph nodes: Secondary | ICD-10-CM | POA: Diagnosis not present

## 2022-01-07 DIAGNOSIS — B961 Klebsiella pneumoniae [K. pneumoniae] as the cause of diseases classified elsewhere: Secondary | ICD-10-CM | POA: Diagnosis not present

## 2022-01-07 DIAGNOSIS — Z4659 Encounter for fitting and adjustment of other gastrointestinal appliance and device: Secondary | ICD-10-CM | POA: Diagnosis not present

## 2022-01-07 DIAGNOSIS — Z85038 Personal history of other malignant neoplasm of large intestine: Secondary | ICD-10-CM | POA: Diagnosis not present

## 2022-01-07 DIAGNOSIS — R109 Unspecified abdominal pain: Secondary | ICD-10-CM | POA: Diagnosis not present

## 2022-01-07 DIAGNOSIS — C189 Malignant neoplasm of colon, unspecified: Secondary | ICD-10-CM | POA: Diagnosis not present

## 2022-01-07 DIAGNOSIS — R7401 Elevation of levels of liver transaminase levels: Secondary | ICD-10-CM | POA: Diagnosis not present

## 2022-01-07 DIAGNOSIS — K219 Gastro-esophageal reflux disease without esophagitis: Secondary | ICD-10-CM | POA: Diagnosis not present

## 2022-01-07 DIAGNOSIS — T8579XA Infection and inflammatory reaction due to other internal prosthetic devices, implants and grafts, initial encounter: Secondary | ICD-10-CM | POA: Diagnosis not present

## 2022-01-07 DIAGNOSIS — Z8505 Personal history of malignant neoplasm of liver: Secondary | ICD-10-CM | POA: Diagnosis not present

## 2022-01-07 DIAGNOSIS — Z87442 Personal history of urinary calculi: Secondary | ICD-10-CM | POA: Diagnosis not present

## 2022-01-07 DIAGNOSIS — R945 Abnormal results of liver function studies: Secondary | ICD-10-CM | POA: Diagnosis not present

## 2022-01-07 DIAGNOSIS — J90 Pleural effusion, not elsewhere classified: Secondary | ICD-10-CM | POA: Diagnosis not present

## 2022-01-07 DIAGNOSIS — Z9049 Acquired absence of other specified parts of digestive tract: Secondary | ICD-10-CM | POA: Diagnosis not present

## 2022-01-07 DIAGNOSIS — Z7960 Long term (current) use of unspecified immunomodulators and immunosuppressants: Secondary | ICD-10-CM | POA: Diagnosis not present

## 2022-01-07 DIAGNOSIS — Z9889 Other specified postprocedural states: Secondary | ICD-10-CM | POA: Diagnosis not present

## 2022-01-07 LAB — CEA: CEA: 0.8 ng/mL (ref 0.0–4.7)

## 2022-01-08 ENCOUNTER — Encounter: Payer: Self-pay | Admitting: Oncology

## 2022-01-08 DIAGNOSIS — C187 Malignant neoplasm of sigmoid colon: Secondary | ICD-10-CM

## 2022-01-08 DIAGNOSIS — R7989 Other specified abnormal findings of blood chemistry: Secondary | ICD-10-CM | POA: Diagnosis not present

## 2022-01-08 DIAGNOSIS — C189 Malignant neoplasm of colon, unspecified: Secondary | ICD-10-CM | POA: Diagnosis not present

## 2022-01-08 DIAGNOSIS — R509 Fever, unspecified: Secondary | ICD-10-CM | POA: Diagnosis not present

## 2022-01-08 DIAGNOSIS — C787 Secondary malignant neoplasm of liver and intrahepatic bile duct: Secondary | ICD-10-CM

## 2022-01-09 ENCOUNTER — Other Ambulatory Visit: Payer: Self-pay

## 2022-01-09 DIAGNOSIS — C187 Malignant neoplasm of sigmoid colon: Secondary | ICD-10-CM | POA: Diagnosis not present

## 2022-01-09 DIAGNOSIS — C787 Secondary malignant neoplasm of liver and intrahepatic bile duct: Secondary | ICD-10-CM | POA: Diagnosis not present

## 2022-01-10 ENCOUNTER — Ambulatory Visit: Payer: BC Managed Care – PPO

## 2022-01-10 DIAGNOSIS — C189 Malignant neoplasm of colon, unspecified: Secondary | ICD-10-CM | POA: Diagnosis not present

## 2022-01-10 DIAGNOSIS — C787 Secondary malignant neoplasm of liver and intrahepatic bile duct: Secondary | ICD-10-CM | POA: Diagnosis not present

## 2022-01-11 ENCOUNTER — Encounter: Payer: Self-pay | Admitting: Oncology

## 2022-01-11 DIAGNOSIS — C187 Malignant neoplasm of sigmoid colon: Secondary | ICD-10-CM | POA: Diagnosis not present

## 2022-01-11 DIAGNOSIS — C787 Secondary malignant neoplasm of liver and intrahepatic bile duct: Secondary | ICD-10-CM | POA: Diagnosis not present

## 2022-01-13 DIAGNOSIS — C189 Malignant neoplasm of colon, unspecified: Secondary | ICD-10-CM | POA: Diagnosis not present

## 2022-01-17 ENCOUNTER — Other Ambulatory Visit: Payer: Self-pay | Admitting: Oncology

## 2022-01-17 DIAGNOSIS — C187 Malignant neoplasm of sigmoid colon: Secondary | ICD-10-CM

## 2022-01-17 DIAGNOSIS — C787 Secondary malignant neoplasm of liver and intrahepatic bile duct: Secondary | ICD-10-CM

## 2022-01-18 ENCOUNTER — Telehealth: Payer: Self-pay

## 2022-01-18 NOTE — Telephone Encounter (Addendum)
@   1445-  Received call from [facility] @ [facility]. Pt's order was for IV antibiotics 12 days past d/c. Last IV abx due 01/23/2022.   Pt's wife, Mickel Baas, called to ask a couple of questions. She thought we had left them a message, but pretty sure it was appt reminder. Anyway, 1. She wants to know, " is it ok for him to take a probiotic, because he has been on these IV antibiotics? I don't want him to get c diff". 2. She wants to clarify when he is to stop the IV antibiotics. They thought he was to finish tomorrow, 01/19/2022. However, someone in outpatient told them he doesn't finish until Sept 18th per Dr Keenan Bachelor.  I LVM @ [facility] @ 1402, to ask if they have stop orders?

## 2022-01-19 ENCOUNTER — Encounter: Payer: Self-pay | Admitting: Oncology

## 2022-01-19 NOTE — Telephone Encounter (Signed)
I notified pt's wife of Dr Remi Deter response. She verbalized understanding.

## 2022-01-20 ENCOUNTER — Encounter: Payer: Self-pay | Admitting: Hematology and Oncology

## 2022-01-20 ENCOUNTER — Inpatient Hospital Stay: Payer: BC Managed Care – PPO

## 2022-01-20 ENCOUNTER — Other Ambulatory Visit: Payer: Self-pay | Admitting: Pharmacist

## 2022-01-20 ENCOUNTER — Inpatient Hospital Stay (HOSPITAL_BASED_OUTPATIENT_CLINIC_OR_DEPARTMENT_OTHER): Payer: BC Managed Care – PPO | Admitting: Hematology and Oncology

## 2022-01-20 VITALS — BP 153/104 | HR 80 | Temp 98.0°F | Resp 18

## 2022-01-20 DIAGNOSIS — Z9221 Personal history of antineoplastic chemotherapy: Secondary | ICD-10-CM | POA: Diagnosis not present

## 2022-01-20 DIAGNOSIS — C187 Malignant neoplasm of sigmoid colon: Secondary | ICD-10-CM | POA: Diagnosis not present

## 2022-01-20 DIAGNOSIS — F419 Anxiety disorder, unspecified: Secondary | ICD-10-CM | POA: Diagnosis not present

## 2022-01-20 DIAGNOSIS — Z95828 Presence of other vascular implants and grafts: Secondary | ICD-10-CM | POA: Insufficient documentation

## 2022-01-20 DIAGNOSIS — C787 Secondary malignant neoplasm of liver and intrahepatic bile duct: Secondary | ICD-10-CM

## 2022-01-20 DIAGNOSIS — D500 Iron deficiency anemia secondary to blood loss (chronic): Secondary | ICD-10-CM

## 2022-01-20 DIAGNOSIS — K649 Unspecified hemorrhoids: Secondary | ICD-10-CM | POA: Diagnosis not present

## 2022-01-20 DIAGNOSIS — Z5111 Encounter for antineoplastic chemotherapy: Secondary | ICD-10-CM | POA: Diagnosis not present

## 2022-01-20 DIAGNOSIS — Z5112 Encounter for antineoplastic immunotherapy: Secondary | ICD-10-CM | POA: Diagnosis not present

## 2022-01-20 LAB — HEPATIC FUNCTION PANEL
ALT: 60 U/L — AB (ref 10–40)
AST: 50 — AB (ref 14–40)
Alkaline Phosphatase: 136 — AB (ref 25–125)

## 2022-01-20 LAB — CBC AND DIFFERENTIAL
HCT: 37 — AB (ref 41–53)
Hemoglobin: 12 — AB (ref 13.5–17.5)
Neutrophils Absolute: 2.34
Platelets: 330 10*3/uL (ref 150–400)
WBC: 4.1

## 2022-01-20 LAB — COMPREHENSIVE METABOLIC PANEL
Albumin: 3.9 (ref 3.5–5.0)
Calcium: 9.3 (ref 8.7–10.7)

## 2022-01-20 LAB — TOTAL PROTEIN, URINE DIPSTICK: Protein, ur: NEGATIVE mg/dL

## 2022-01-20 LAB — BASIC METABOLIC PANEL
BUN: 12 (ref 4–21)
CO2: 24 — AB (ref 13–22)
Chloride: 108 (ref 99–108)
Creatinine: 0.9 (ref 0.6–1.3)
Glucose: 125
Potassium: 3.9 mEq/L (ref 3.5–5.1)
Sodium: 140 (ref 137–147)

## 2022-01-20 LAB — CBC
MCV: 78 — AB (ref 80–94)
RBC: 4.76 (ref 3.87–5.11)

## 2022-01-20 MED ORDER — ALTEPLASE 2 MG IJ SOLR
2.0000 mg | Freq: Once | INTRAMUSCULAR | Status: AC | PRN
  Administered 2022-01-20: 2 mg
  Filled 2022-01-20: qty 2

## 2022-01-20 MED ORDER — ALTEPLASE 2 MG IJ SOLR
2.0000 mg | Freq: Once | INTRAMUSCULAR | Status: AC
  Administered 2022-01-20: 2 mg
  Filled 2022-01-20: qty 2

## 2022-01-20 NOTE — Progress Notes (Signed)
[facility]  [address] [place],  [place]  [address] [phone]  Clinic Day:  01/20/2022  Referring physician: Angelina Sheriff, MD  ASSESSMENT & PLAN:   Assessment & Plan: Malignant neoplasm metastatic to liver [facility]) History of liver metastasis in January 2021 treated with resection alone.  The had recurrent liver metastasis treated with a partial hepatectomy in June.  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  There was 1 negative node but margins were close.  This was attached to the diaphragm but the tissue from the diaphragm is benign.  He had a complicated postoperative course developing an cyst requiring drainage.  He then developed a right pleural effusion in July treated with a VATS procedure.  He is receiving chemotherapy with FOLFIRI and Avastin for at least 6 months.  His third cycle of chemotherapy was delayed as he was admitted with sepsis.  He continues IV antibiotics as an outpatient.  He does not have evidence of persistent infection, so we plan to proceed with a third cycle of chemotherapy next week.  We will plan to see him back in 2 weeks for repeat clinical assessment prior to a fourth cycle.  Colon cancer [facility]) History of stage IIIB colon cancer diagnosed in September 2017.  He was treated with surgical resection followed by 6 months of adjuvant chemotherapy with FOLFOX. He had ascites and peritoneal nodules in 2018 with negative biopsy and negative fluid cytology.  He was taken for exploratory laparotomy with removal of the nodule which is found to be benign.  Iron deficiency anemia due to chronic blood loss Microcytic anemia.  Iron studies were equivocal in July, but he was placed on ferrous sulfate daily with improvement in his hemoglobin.  We will continue ferrous sulfate daily for another 2 months.   The patient understands the plans discussed today and is in agreement with them.  He  knows to contact our office if he develops concerns prior to his next appointment.   I provided 30 minutes of face-to-face time during this encounter and > 50% was spent counseling as documented under my assessment and plan.    Marvia Pickles, PA-C  [facility] AT [facility] [address] [place] [address] Dept: [phone] Dept Fax: [phone]   Orders Placed This Encounter  Procedures   CBC and differential    This external order was created through the Results Console.   CBC    This external order was created through the Results Console.   Basic metabolic panel    This external order was created through the Results Console.   Comprehensive metabolic panel    This external order was created through the Results Console.   Hepatic function panel    This external order was created through the Results Console.   CBC    This order was created through External Result Entry      CHIEF COMPLAINT:  CC: Recurrent colon cancer to liver  Current Treatment: FOLFIRI/bevacizumab every 2 weeks  HISTORY OF PRESENT ILLNESS:   Oncology History  Colon cancer ([place])  01/23/2016 Cancer Staging   Staging form: Colon and Rectum, AJCC 7th Edition - Clinical stage from 01/23/2016: Stage IIIB (T3, N2a, M0) - Signed by Derwood Kaplan, MD on 05/03/2020 Staging comments: 3.5 cm MMR, MSI normal, No mutation of KRAS or BRAF, neg.genetics Rec'd 6 months adjuvant FOLFOX chemotherapy  02/02/2016 Initial Diagnosis  Colon cancer ([place])   12/13/2021 - 12/29/2021 Chemotherapy   Patient is on Treatment Plan : COLORECTAL FOLFIRI / BEVACIZUMAB Q14D     12/13/2021 -  Chemotherapy   Patient is on Treatment Plan : COLORECTAL FOLFIRI + Bevacizumab q14d     Malignant neoplasm metastatic to liver ([place])  05/29/2019 Initial Diagnosis   Malignant neoplasm metastatic to liver ([place])   12/13/2021 - 12/29/2021 Chemotherapy   Patient is on Treatment Plan :  COLORECTAL FOLFIRI / BEVACIZUMAB Q14D     12/13/2021 -  Chemotherapy   Patient is on Treatment Plan : COLORECTAL FOLFIRI + Bevacizumab q14d         INTERVAL HISTORY:  Eduardo Hester is here today for repeat clinical assessment prior to resuming chemotherapy after being hospitalized earlier this month with Klebsiella pneumoniae sepsis.  He continues IV antibiotics as an outpatient and his final dose is scheduled for September 18th. He denies fevers, chills or other signs of infection. He denies pain. His appetite is good. His weight has decreased 2 pounds over last 2 weeks .  His wife accompanies him today and is concerned that the emergency room nurse may have damaged his port as it no longer gives blood return.  I explained if there was damage to report, he would have had difficulty with all the IV treatment that he has received since being in the emergency room.  I explained this most likely is due to a fibrin sheath and recommended we try alteplase and if this was not effective plan for fluoroscopy of the port.  REVIEW OF SYSTEMS:  Review of Systems  Constitutional:  Positive for fatigue. Negative for appetite change, chills, fever and unexpected weight change.  HENT:   Negative for lump/mass, mouth sores and sore throat.   Respiratory:  Negative for cough and shortness of breath.   Cardiovascular:  Negative for chest pain and leg swelling.  Gastrointestinal:  Negative for abdominal pain, constipation, diarrhea, nausea and vomiting.  Genitourinary:  Negative for difficulty urinating, dysuria, frequency and hematuria.   Musculoskeletal:  Negative for arthralgias, back pain and myalgias.  Skin:  Negative for itching, rash and wound.  Neurological:  Negative for dizziness, extremity weakness, headaches, light-headedness and numbness.  Hematological:  Negative for adenopathy.  Psychiatric/Behavioral:  Negative for depression and sleep disturbance. The patient is not nervous/anxious.      VITALS:   Blood pressure (!) 136/104, pulse 90, temperature 98.4 F (36.9 C), temperature source Oral, resp. rate 20, height 6' 6" (1.981 m), weight 261 lb 3.2 oz (118.5 kg), SpO2 97 %.  Wt Readings from Last 3 Encounters:  01/20/22 261 lb 3.2 oz (118.5 kg)  01/06/22 263 lb 14.4 oz (119.7 kg)  12/27/21 265 lb (120.2 kg)    Body mass index is 30.18 kg/m.  Performance status (ECOG): 1 - Symptomatic but completely ambulatory  PHYSICAL EXAM:  Physical Exam Vitals and nursing note reviewed.  Constitutional:      General: He is not in acute distress.    Appearance: Normal appearance. He is normal weight.  HENT:     Head: Normocephalic and atraumatic.     Mouth/Throat:     Mouth: Mucous membranes are moist.     Pharynx: Oropharynx is clear. No oropharyngeal exudate or posterior oropharyngeal erythema.  Eyes:     General: No scleral icterus.    Extraocular Movements: Extraocular movements intact.     Conjunctiva/sclera: Conjunctivae normal.     Pupils: Pupils are equal, round, and reactive to light.  Cardiovascular:     Rate and Rhythm: Normal rate and regular rhythm.     Heart sounds: Normal heart sounds. No murmur heard.    No friction rub. No gallop.  Pulmonary:     Effort: Pulmonary effort is normal.     Breath sounds: Normal breath sounds. No wheezing, rhonchi or rales.  Abdominal:     General: Bowel sounds are normal. There is no distension.     Palpations: Abdomen is soft. There is no hepatomegaly, splenomegaly or mass.     Tenderness: There is no abdominal tenderness.  Musculoskeletal:        General: Normal range of motion.     Cervical back: Normal range of motion and neck supple. No tenderness.     Right lower leg: No edema.     Left lower leg: No edema.  Lymphadenopathy:     Cervical: No cervical adenopathy.     Upper Body:     Right upper body: No supraclavicular or axillary adenopathy.     Left upper body: No supraclavicular or axillary adenopathy.     Lower Body: No  right inguinal adenopathy. No left inguinal adenopathy.  Skin:    General: Skin is warm and dry.     Coloration: Skin is not jaundiced.     Findings: No rash.  Neurological:     Mental Status: He is alert and oriented to person, place, and time.     Cranial Nerves: No cranial nerve deficit.  Psychiatric:        Mood and Affect: Mood normal.        Behavior: Behavior normal.        Thought Content: Thought content normal.     LABS:      Latest Ref Rng & Units 01/20/2022   12:00 AM 01/06/2022   12:00 AM 12/23/2021   12:00 AM  CBC  WBC  4.1     3.7     4.5      Hemoglobin 13.5 - 17.5 12.0     11.5     10.7      Hematocrit 41 - 53 37     36     34      Platelets 150 - 400 K/uL 330     287     296         This result is from an external source.      Latest Ref Rng & Units 01/20/2022   12:00 AM 01/06/2022   12:00 AM 12/23/2021   12:00 AM  CMP  BUN 4 - _0 Creatinine 0.6 - 1.3 0.9     1.3     0.9      Sodium 137 - 147 140     140     138      Potassium 3.5 - 5.1 mEq/L 3.9     4.3     4.3      Chloride 99 - 108 108     102     104      CO2 13 - _1 Calcium 8.7 - 10.7 9.3     9.4     9.2      Alkaline Phos 25 - 125 136     106     91  AST 14 - 40 50     53     25      ALT 10 - 40 U/L 60     39          This result is from an external source.     Lab Results  Component Value Date   CEA1 0.8 01/06/2022   /  CEA  Date Value Ref Range Status  01/06/2022 0.8 0.0 - 4.7 ng/mL Final    Comment:    (NOTE)                             Nonsmokers          <3.9                             Smokers             <5.6 [employer] Electrochemiluminescence Immunoassay (ECLIA) Values obtained with different assay methods or kits cannot be used interchangeably.  Results cannot be interpreted as absolute evidence of the presence or absence of malignant disease. Performed At: [facility] [place], [place]  [id number] Rush Farmer MD GE:[id number]    No results found for: "PSA1" No results found for: "CAN199" No results found for: "CAN125"  No results found for: "TOTALPROTELP", "ALBUMINELP", "A1GS", "A2GS", "BETS", "BETA2SER", "GAMS", "MSPIKE", "SPEI" Lab Results  Component Value Date   TIBC 225 (L) 11/22/2021   FERRITIN 292 11/22/2021   IRONPCTSAT 6 (L) 11/22/2021   No results found for: "LDH"  STUDIES:  No results found.    HISTORY:   Past Medical History:  Diagnosis Date   GERD (gastroesophageal reflux disease)    Renal lithiasis     Past Surgical History:  Procedure Laterality Date   SIGMOIDECTOMY  03/2016    Family History  Problem Relation Age of Onset   Lung cancer Father 16   Leukemia Father 43       acute myelogenous   Colon cancer Sister 18    Social History:  reports that he has quit smoking. He has never used smokeless tobacco. He reports that he does not currently use alcohol. No history on file for drug use.The patient is accompanied by his wife today.  Allergies: No Known Allergies  Current Medications: Current Outpatient Medications  Medication Sig Dispense Refill   Probiotic Product (PROBIOTIC BLEND PO) Take 1 tablet by mouth daily.     ascorbic acid (VITAMIN C) 1000 MG tablet Take by mouth.     benzonatate (TESSALON) 200 MG capsule Take 1 capsule (200 mg total) by mouth 3 (three) times daily as needed for cough. 20 capsule 1   Calcium Polycarbophil (FIBER) 625 MG TABS Take by mouth.     cetirizine (ZYRTEC) 10 MG tablet Take by mouth.     Cholecalciferol (VITAMIN D) 50 MCG (2000 UT) tablet Take by mouth.     Docusate Sodium (DSS) 100 MG CAPS Take 1 capsule by mouth daily.     ferrous sulfate 325 (65 FE) MG tablet Take 1 capsule by mouth every morning.     loperamide (IMODIUM) 2 MG capsule Take 1 capsule (2 mg total) by mouth as needed for diarrhea or loose stools. Take 2 at diarrhea onset , then 1 every 2hr until 12hrs with no BM. May take 2  every 4hrs at night. If diarrhea recurs repeat. 100 capsule 5  LORazepam (ATIVAN) 1 MG tablet Take 1 tablet (1 mg total) by mouth every 6 (six) hours as needed for anxiety or sleep. 100 tablet 0   Multiple Vitamin (MULTI-VITAMIN) tablet Take 1 tablet by mouth daily.     omeprazole (PRILOSEC) 20 MG capsule Take by mouth.     ondansetron (ZOFRAN) 8 MG tablet Take 1 tablet (8 mg total) by mouth 2 (two) times daily as needed for refractory nausea / vomiting. Start on day 3 after chemotherapy. 30 tablet 1   ondansetron (ZOFRAN-ODT) 4 MG disintegrating tablet Take 4 mg by mouth every 8 (eight) hours as needed.     oxyCODONE (OXY IR/ROXICODONE) 5 MG immediate release tablet Take by mouth.     prochlorperazine (COMPAZINE) 10 MG tablet Take 1 tablet (10 mg total) by mouth every 6 (six) hours as needed (NAUSEA). 30 tablet 1   vitamin B-12 (CYANOCOBALAMIN) 250 MCG tablet Take by mouth.     zinc gluconate 50 MG tablet Take 50 mg by mouth daily.     No current facility-administered medications for this visit.   Facility-Administered Medications Ordered in Other Visits  Medication Dose Route Frequency Provider Last Rate Last Admin   atropine 1 MG/ML injection            palonosetron (ALOXI) 0.25 MG/5ML injection

## 2022-01-20 NOTE — Patient Instructions (Signed)
Alteplase Solution for Injection What is this medication? ALTEPLASE (AL te plase) can dissolve blood clots that form in the heart, blood vessels, or lungs after a heart attack. This medicine is also given to improve recovery and decrease the chance of disability in patients having symptoms of a stroke. This medicine may be used for other purposes; ask your health care provider or pharmacist if you have questions. This medicine may be used for other purposes; ask your health care provider or pharmacist if you have questions. COMMON BRAND NAME(S): Activase What should I tell my care team before I take this medication? They need to know if you have any of these conditions: aneurysm bleeding problems or problems with blood clotting blood vessel disease or damaged blood vessels diabetic retinopathy head injury or tumor high blood pressure infection irregular heartbeats previous stroke recent biopsy or surgery an unusual or allergic reaction to alteplase, other medicines, foods, dyes, or preservatives pregnant or trying to get pregnant breast-feeding How should I use this medication? This medicine is for injection into a vein. It is given by a health care professional in a hospital or clinic setting. Talk to your pediatrician regarding the use of this medicine in children. Special care may be needed. Overdosage: If you think you have taken too much of this medicine contact a poison control center or emergency room at once. NOTE: This medicine is only for you. Do not share this medicine with others. Overdosage: If you think you have taken too much of this medicine contact a poison control center or emergency room at once. NOTE: This medicine is only for you. Do not share this medicine with others. What if I miss a dose? This does not apply. What may interact with this medication? Do not take this medicine with any of the following medications: aminocaproic acid aprotinin tranexamic  acid This medicine may also interact with the following medications: antiinflammatory drugs, NSAIDs like ibuprofen aspirin and aspirin-like medicines blood thinners, like warfarin, heparin or enoxaparin dipyridamole This list may not describe all possible interactions. Give your health care provider a list of all the medicines, herbs, non-prescription drugs, or dietary supplements you use. Also tell them if you smoke, drink alcohol, or use illegal drugs. Some items may interact with your medicine. This list may not describe all possible interactions. Give your health care provider a list of all the medicines, herbs, non-prescription drugs, or dietary supplements you use. Also tell them if you smoke, drink alcohol, or use illegal drugs. Some items may interact with your medicine. What should I watch for while using this medication? Your condition will be monitored carefully while you are receiving this medicine. Follow the advice of your doctor or health care professional exactly. You may need bed rest to minimize the risk of bleeding. This medicine can make you bleed more easily. This effect can last for several days. Take special care brushing or flossing your teeth. Do not take aspirin, ibuprofen, or other nonprescription pain relievers during or for several days after alteplase treatment unless otherwise instructed by your doctor or health care professional. You may feel dizzy or lightheaded. To avoid the risk of dizzy or fainting spells, sit or stand up slowly, especially if you are an older patient. What side effects may I notice from receiving this medication? Side effects that you should report to your doctor or health care professional as soon as possible: allergic reactions like skin rash, itching or hives, swelling of the face, lips, or tongue signs  and symptoms of bleeding such as bloody or black, tarry stools; red or dark-brown urine; spitting up blood or brown material that looks like  coffee grounds; red spots on the skin; unusual bruising or bleeding from the eye, gums, or nose signs and symptoms of a stroke such as changes in vision; confusion; trouble speaking or understanding; severe headaches; sudden numbness or weakness of the face, arm, or leg; trouble walking; dizziness; loss of balance or coordination slow or fast heart rate Side effects that usually do not require medical attention (report to your doctor or health care professional if they continue or are bothersome): dizziness fever nausea, vomiting This list may not describe all possible side effects. Call your doctor for medical advice about side effects. You may report side effects to FDA at 1-800-FDA-1088. This list may not describe all possible side effects. Call your doctor for medical advice about side effects. You may report side effects to FDA at 1-800-FDA-1088. Where should I keep my medication? This does not apply. You will not be given this medicine to store at home. NOTE: This sheet is a summary. It may not cover all possible information. If you have questions about this medicine, talk to your doctor, pharmacist, or health care provider.  2023 Elsevier/Gold Standard (2004-08-15 00:00:00) [employer], Adult A central line is a long, thin tube (catheter) that is put into a vein so that it goes to a large vein above your heart. It can be used to: Give you medicine or fluids. Give you food and nutrients. Take blood or give you blood for testing or treatments. Types of central lines There are four main types of central lines: Peripherally inserted central catheter (PICC) line. This type is usually put in the upper arm and goes up the arm to the heart. Tunneled central line. This type is placed in a large vein in the neck, chest, or groin. It is tunneled under the skin and brought out through a second incision. Non-tunneled central line. This type is used for a shorter time than other types, usually for 7  days at the most. It is inserted in the neck, chest, or groin. Implanted port. This type can stay in place longer than other types of central lines. It is normally put in the upper chest but can also be placed in the upper arm or the belly. Surgery is needed to put it in and take it out. The type of central line you get will depend on how long you need it and your medical condition. Tell a doctor about: Any allergies you have. All medicines you are taking. These include vitamins, herbs, eye drops, creams, and over-the-counter medicines. Any problems you or family members have had with anesthetic medicines. Any blood disorders you have. Any surgeries you have had. Any medical conditions you have. Whether you are pregnant or may be pregnant. What are the risks? Generally, central lines are safe. However, problems may occur, including: Infection. A blood clot. Bleeding from the place where the central line was inserted. Getting a hole or crack in the central line. If this happens, the central line will need to be replaced. Central line failure. The catheter moving or coming out of place. What happens before the procedure? Medicines Ask your doctor about changing or stopping: Your normal medicines. Vitamins, herbs, and supplements. Over-the-counter medicines. Do not take aspirin or ibuprofen unless you are told to. General instructions Follow instructions from your doctor about eating or drinking. For your safety, your doctor  may: Mark the area of the procedure. Remove hair at the procedure site. Ask you to wash with a soap that kills germs. Plan to have a responsible adult take you home from the hospital or clinic. If you will be going home right after the procedure, plan to have a responsible adult care for you for the time you are told. This is important. What happens during the procedure? An IV tube will be put into one of your veins. You may be given: A sedative. This medicine  helps you relax. Anesthetics. These medicines numb certain areas of your body. Your skin will be cleaned with a germ-killing (antiseptic) solution. You may be covered with clean drapes. Your blood pressure, heart rate, breathing rate, and blood oxygen level will be monitored during the procedure. The central line will be put into the vein and moved through it to the correct spot. The doctor may use X-ray equipment to help guide the central line to the right place. A bandage (dressing) will be placed over the insertion area. The procedure may vary among doctors and hospitals. What can I expect after the procedure? You will be monitored until you leave the hospital or clinic. This includes checking your blood pressure, heart rate, breathing rate, and blood oxygen level. Caps may be placed on the ends of the central line tubing. If you were given a sedative during your procedure, do not drive or use machines until your doctor says that it is safe. Follow these instructions at home: Caring for the tube  Follow instructions from your doctor about: Flushing the tube. Cleaning the tube and the area around it. Only use germ-free (sterile) supplies to flush. The supplies should be from your doctor, a pharmacy, or another place that your doctor recommends. Before you flush the tube or clean the area around the tube: Wash your hands with soap and water for at least 20 seconds. If you cannot use soap and water, use hand sanitizer. Clean the central line hub with rubbing alcohol. To do this: Scrub it using a twisting motion and rub for 10 to 15 seconds or for 30 twists. Follow the manufacturer's instructions. Be sure you scrub the top of the hub, not just the sides. Never reuse alcohol pads. Let the hub dry before use. Keep it from touching anything while drying. Caring for your skin Check the skin around the central line every day for signs of infection. Check for: Redness, swelling, or pain. Fluid or  blood. Warmth. Pus or a bad smell. Keep the area where the tube was put in clean and dry. Change bandages only as told by your doctor. Keep your bandage dry. If a bandage gets wet, have it changed right away. General instructions Keep the tube clamped, unless it is being used. If you or someone else accidentally pulls on the tube, make sure: The bandage is okay. There is no bleeding. The tube has not been pulled out. Do not use scissors or sharp objects near the tube. Do not take baths, swim, or use a hot tub until your doctor says it is okay. Ask your doctor if you may take showers. You may only be allowed to take sponge baths. Ask your doctor what activities are safe for you. Your doctor may tell you not to lift anything or move your arm too much. Take over-the-counter and prescription medicines only as told by your doctor. Keep all follow-up visits. Storing and throwing away supplies Keep your supplies in a clean, dry  location. Throw away any used syringes in a container that is only for sharp items (sharps container). You can buy a sharps container from a pharmacy, or you can make one by using an empty hard plastic bottle with a cover. Place any used bandages or infusion bags into a plastic bag. Throw that bag in the trash. Contact a doctor if: You have any of these signs of infection where the tube was put in: Redness, swelling, or pain. Fluid or blood. Warmth. Pus or a bad smell. Get help right away if: You have: A fever or chills. Shortness of breath. Pain in your chest. A fast heartbeat. Swelling in your neck, face, chest, or arm. You feel dizzy or you faint. There are red lines coming from where the tube was put in. The area where the tube was put in is bleeding and the bleeding will not stop. Your tube is hard to flush. You do not get a blood return from the tube. The tube gets loose or comes out. The tube has a hole or a tear. The tube leaks. Summary A central  line is a long, thin tube (catheter) that is put in your vein. It can be used to give you medicine, food, or fluids. Follow instructions from your doctor about flushing and cleaning the tube. Keep the area where the tube was put in clean and dry. Ask your doctor what activities are safe for you. This information is not intended to replace advice given to you by your health care provider. Make sure you discuss any questions you have with your health care provider. Document Revised: 12/25/2019 Document Reviewed: 12/25/2019 Elsevier Patient Education  [place].

## 2022-01-20 NOTE — Progress Notes (Signed)
1250 alteplase port. Let instilled for 30 minutes. No blood.  Tried to check again at 1330 no blood returned. Tried again at 1340 no blood returned. Blood tinged in syringe. Obtained another order for alteplase. 1343 alteplase  instilled for 30 minutes. Checked for blood return again at 1407, no blood returned. 47 Tried to check for blood return again. No blood returned. Flushes well. No pain at site or swelling. I will inform Rosanne Sack, PAC of the above.

## 2022-01-20 NOTE — Assessment & Plan Note (Signed)
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  He was treated with surgical resection followed by 6 months of adjuvant chemotherapy with FOLFOX. He had ascites and peritoneal nodules in 2018 with negative biopsy and negative fluid cytology.  He was taken for exploratory laparotomy with removal of the nodule which is found to be benign.

## 2022-01-20 NOTE — Assessment & Plan Note (Signed)
Microcytic anemia.  Iron studies were equivocal in July, but he was placed on ferrous sulfate daily with improvement in his hemoglobin.  We will continue ferrous sulfate daily for another 2 months.

## 2022-01-20 NOTE — Assessment & Plan Note (Signed)
History of liver metastasis in January 2021 treated with resection alone.  The had recurrent liver metastasis treated with a partial hepatectomy in June.  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  There was 1 negative node but margins were close.  This was attached to the diaphragm but the tissue from the diaphragm is benign.  He had a complicated postoperative course developing an cyst requiring drainage.  He then developed a right pleural effusion in July treated with a VATS procedure.  He is receiving chemotherapy with FOLFIRI and Avastin for at least 6 months.  His third cycle of chemotherapy was delayed as he was admitted with sepsis.  He continues IV antibiotics as an outpatient.  He does not have evidence of persistent infection, so we plan to proceed with a third cycle of chemotherapy next week.  We will plan to see him back in 2 weeks for repeat clinical assessment prior to a fourth cycle.

## 2022-01-21 ENCOUNTER — Other Ambulatory Visit: Payer: Self-pay

## 2022-01-23 MED FILL — Leucovorin Calcium For Inj 350 MG: INTRAMUSCULAR | Qty: 51 | Status: AC

## 2022-01-23 MED FILL — Dexamethasone Sodium Phosphate Inj 100 MG/10ML: INTRAMUSCULAR | Qty: 1 | Status: AC

## 2022-01-23 MED FILL — Bevacizumab-awwb IV Soln 400 MG/16ML (For Infusion): INTRAVENOUS | Qty: 24 | Status: AC

## 2022-01-23 MED FILL — Fluorouracil IV Soln 5 GM/100ML (50 MG/ML): INTRAVENOUS | Qty: 122 | Status: AC

## 2022-01-23 MED FILL — Fluorouracil IV Soln 2.5 GM/50ML (50 MG/ML): INTRAVENOUS | Qty: 20 | Status: AC

## 2022-01-23 MED FILL — Irinotecan HCl Inj 100 MG/5ML (20 MG/ML): INTRAVENOUS | Qty: 23 | Status: AC

## 2022-01-24 ENCOUNTER — Inpatient Hospital Stay: Payer: BC Managed Care – PPO

## 2022-01-24 VITALS — BP 169/98 | HR 84 | Temp 98.0°F | Resp 18 | Ht 78.0 in | Wt 263.0 lb

## 2022-01-24 DIAGNOSIS — D500 Iron deficiency anemia secondary to blood loss (chronic): Secondary | ICD-10-CM | POA: Diagnosis not present

## 2022-01-24 DIAGNOSIS — Z5111 Encounter for antineoplastic chemotherapy: Secondary | ICD-10-CM | POA: Diagnosis not present

## 2022-01-24 DIAGNOSIS — F419 Anxiety disorder, unspecified: Secondary | ICD-10-CM | POA: Diagnosis not present

## 2022-01-24 DIAGNOSIS — Z9221 Personal history of antineoplastic chemotherapy: Secondary | ICD-10-CM | POA: Diagnosis not present

## 2022-01-24 DIAGNOSIS — C187 Malignant neoplasm of sigmoid colon: Secondary | ICD-10-CM

## 2022-01-24 DIAGNOSIS — C787 Secondary malignant neoplasm of liver and intrahepatic bile duct: Secondary | ICD-10-CM

## 2022-01-24 DIAGNOSIS — Z5112 Encounter for antineoplastic immunotherapy: Secondary | ICD-10-CM | POA: Diagnosis not present

## 2022-01-24 DIAGNOSIS — K649 Unspecified hemorrhoids: Secondary | ICD-10-CM | POA: Diagnosis not present

## 2022-01-24 MED ORDER — SODIUM CHLORIDE 0.9 % IV SOLN
2375.0000 mg/m2 | INTRAVENOUS | Status: DC
  Administered 2022-01-24: 6100 mg via INTRAVENOUS
  Filled 2022-01-24: qty 122

## 2022-01-24 MED ORDER — FLUOROURACIL CHEMO INJECTION 2.5 GM/50ML
398.0000 mg/m2 | Freq: Once | INTRAVENOUS | Status: AC
  Administered 2022-01-24: 1000 mg via INTRAVENOUS
  Filled 2022-01-24: qty 20

## 2022-01-24 MED ORDER — SODIUM CHLORIDE 0.9% FLUSH: 10.0000 mL | INTRAVENOUS | Status: DC | PRN

## 2022-01-24 MED ORDER — PALONOSETRON HCL INJECTION 0.25 MG/5ML
0.2500 mg | Freq: Once | INTRAVENOUS | Status: AC
  Administered 2022-01-24: 0.25 mg via INTRAVENOUS
  Filled 2022-01-24: qty 5

## 2022-01-24 MED ORDER — HEPARIN SOD (PORK) LOCK FLUSH 100 UNIT/ML IV SOLN: 500.0000 [IU] | Freq: Once | INTRAVENOUS | Status: DC | PRN

## 2022-01-24 MED ORDER — SODIUM CHLORIDE 0.9 % IV SOLN
10.0000 mg | Freq: Once | INTRAVENOUS | Status: AC
  Administered 2022-01-24: 10 mg via INTRAVENOUS
  Filled 2022-01-24: qty 10

## 2022-01-24 MED ORDER — SODIUM CHLORIDE 0.9 % IV SOLN: Freq: Once | INTRAVENOUS | Status: AC

## 2022-01-24 MED ORDER — SODIUM CHLORIDE 0.9 % IV SOLN
5.0000 mg/kg | Freq: Once | INTRAVENOUS | Status: AC
  Administered 2022-01-24: 600 mg via INTRAVENOUS
  Filled 2022-01-24: qty 24

## 2022-01-24 MED ORDER — IRINOTECAN HCL CHEMO INJECTION 100 MG/5ML
180.0000 mg/m2 | Freq: Once | INTRAVENOUS | Status: AC
  Administered 2022-01-24: 460 mg via INTRAVENOUS
  Filled 2022-01-24: qty 15

## 2022-01-24 MED ORDER — LEUCOVORIN CALCIUM INJECTION 350 MG
397.0000 mg/m2 | Freq: Once | INTRAVENOUS | Status: AC
  Administered 2022-01-24: 1020 mg via INTRAVENOUS
  Filled 2022-01-24: qty 50

## 2022-01-24 NOTE — Patient Instructions (Signed)
Fluorouracil Injection What is this medication? FLUOROURACIL (flure oh YOOR a sil) treats some types of cancer. It works by slowing down the growth of cancer cells. This medicine may be used for other purposes; ask your health care provider or pharmacist if you have questions. COMMON BRAND NAME(S): Adrucil What should I tell my care team before I take this medication? They need to know if you have any of these conditions: Blood disorders Dihydropyrimidine dehydrogenase (DPD) deficiency Infection, such as chickenpox, cold sores, herpes Kidney disease Liver disease Poor nutrition Recent or ongoing radiation therapy An unusual or allergic reaction to fluorouracil, other medications, foods, dyes, or preservatives If you or your partner are pregnant or trying to get pregnant Breast-feeding How should I use this medication? This medication is injected into a vein. It is administered by your care team in a hospital or clinic setting. Talk to your care team about the use of this medication in children. Special care may be needed. Overdosage: If you think you have taken too much of this medicine contact a poison control center or emergency room at once. NOTE: This medicine is only for you. Do not share this medicine with others. What if I miss a dose? Keep appointments for follow-up doses. It is important not to miss your dose. Call your care team if you are unable to keep an appointment. What may interact with this medication? Do not take this medication with any of the following: Live virus vaccines This medication may also interact with the following: Medications that treat or prevent blood clots, such as warfarin, enoxaparin, dalteparin This list may not describe all possible interactions. Give your health care provider a list of all the medicines, herbs, non-prescription drugs, or dietary supplements you use. Also tell them if you smoke, drink alcohol, or use illegal drugs. Some items may  interact with your medicine. What should I watch for while using this medication? Your condition will be monitored carefully while you are receiving this medication. This medication may make you feel generally unwell. This is not uncommon as chemotherapy can affect healthy cells as well as cancer cells. Report any side effects. Continue your course of treatment even though you feel ill unless your care team tells you to stop. In some cases, you may be given additional medications to help with side effects. Follow all directions for their use. This medication may increase your risk of getting an infection. Call your care team for advice if you get a fever, chills, sore throat, or other symptoms of a cold or flu. Do not treat yourself. Try to avoid being around people who are sick. This medication may increase your risk to bruise or bleed. Call your care team if you notice any unusual bleeding. Be careful brushing or flossing your teeth or using a toothpick because you may get an infection or bleed more easily. If you have any dental work done, tell your dentist you are receiving this medication. Avoid taking medications that contain aspirin, acetaminophen, ibuprofen, naproxen, or ketoprofen unless instructed by your care team. These medications may hide a fever. Do not treat diarrhea with over the counter products. Contact your care team if you have diarrhea that lasts more than 2 days or if it is severe and watery. This medication can make you more sensitive to the sun. Keep out of the sun. If you cannot avoid being in the sun, wear protective clothing and sunscreen. Do not use sun lamps, tanning beds, or tanning booths. Talk to   your care team if you or your partner wish to become pregnant or think you might be pregnant. This medication can cause serious birth defects if taken during pregnancy and for 3 months after the last dose. A reliable form of contraception is recommended while taking this  medication and for 3 months after the last dose. Talk to your care team about effective forms of contraception. Do not father a child while taking this medication and for 3 months after the last dose. Use a condom while having sex during this time period. Do not breastfeed while taking this medication. This medication may cause infertility. Talk to your care team if you are concerned about your fertility. What side effects may I notice from receiving this medication? Side effects that you should report to your care team as soon as possible: Allergic reactions--skin rash, itching, hives, swelling of the face, lips, tongue, or throat Heart attack--pain or tightness in the chest, shoulders, arms, or jaw, nausea, shortness of breath, cold or clammy skin, feeling faint or lightheaded Heart failure--shortness of breath, swelling of the ankles, feet, or hands, sudden weight gain, unusual weakness or fatigue Heart rhythm changes--fast or irregular heartbeat, dizziness, feeling faint or lightheaded, chest pain, trouble breathing High ammonia level--unusual weakness or fatigue, confusion, loss of appetite, nausea, vomiting, seizures Infection--fever, chills, cough, sore throat, wounds that don't heal, pain or trouble when passing urine, general feeling of discomfort or being unwell Low red blood cell level--unusual weakness or fatigue, dizziness, headache, trouble breathing Pain, tingling, or numbness in the hands or feet, muscle weakness, change in vision, confusion or trouble speaking, loss of balance or coordination, trouble walking, seizures Redness, swelling, and blistering of the skin over hands and feet Severe or prolonged diarrhea Unusual bruising or bleeding Side effects that usually do not require medical attention (report to your care team if they continue or are bothersome): Dry skin Headache Increased tears Nausea Pain, redness, or swelling with sores inside the mouth or throat Sensitivity  to light Vomiting This list may not describe all possible side effects. Call your doctor for medical advice about side effects. You may report side effects to FDA at 1-800-FDA-1088. Where should I keep my medication? This medication is given in a hospital or clinic. It will not be stored at home. NOTE: This sheet is a summary. It may not cover all possible information. If you have questions about this medicine, talk to your doctor, pharmacist, or health care provider.  2023 [employer] Standard (2021-08-30 00:00:00) Irinotecan Injection What is this medication? IRINOTECAN (ir in oh TEE kan) treats some types of cancer. It works by slowing down the growth of cancer cells. This medicine may be used for other purposes; ask your health care provider or pharmacist if you have questions. COMMON BRAND NAME(S): Camptosar What should I tell my care team before I take this medication? They need to know if you have any of these conditions: Dehydration Diarrhea Infection, especially a viral infection, such as chickenpox, cold sores, herpes Liver disease Low blood cell levels (white cells, red cells, and platelets) Low levels of electrolytes, such as calcium, magnesium, or potassium in your blood Recent or ongoing radiation An unusual or allergic reaction to irinotecan, other medications, foods, dyes, or preservatives If you or your partner are pregnant or trying to get pregnant Breast-feeding How should I use this medication? This medication is injected into a vein. It is given by your care team in a hospital or clinic setting. Talk to your care   team about the use of this medication in children. Special care may be needed. Overdosage: If you think you have taken too much of this medicine contact a poison control center or emergency room at once. NOTE: This medicine is only for you. Do not share this medicine with others. What if I miss a dose? Keep appointments for follow-up doses. It is  important not to miss your dose. Call your care team if you are unable to keep an appointment. What may interact with this medication? Do not take this medication with any of the following: Cobicistat Itraconazole This medication may also interact with the following: Certain antibiotics, such as clarithromycin, rifampin, rifabutin Certain antivirals for HIV or AIDS Certain medications for fungal infections, such as ketoconazole, posaconazole, voriconazole Certain medications for seizures, such as carbamazepine, phenobarbital, phenytoin Gemfibrozil Nefazodone St. John's wort This list may not describe all possible interactions. Give your health care provider a list of all the medicines, herbs, non-prescription drugs, or dietary supplements you use. Also tell them if you smoke, drink alcohol, or use illegal drugs. Some items may interact with your medicine. What should I watch for while using this medication? Your condition will be monitored carefully while you are receiving this medication. You may need blood work while taking this medication. This medication may make you feel generally unwell. This is not uncommon as chemotherapy can affect healthy cells as well as cancer cells. Report any side effects. Continue your course of treatment even though you feel ill unless your care team tells you to stop. This medication can cause serious side effects. To reduce the risk, your care team may give you other medications to take before receiving this one. Be sure to follow the directions from your care team. This medication may affect your coordination, reaction time, or judgement. Do not drive or operate machinery until you know how this medication affects you. Sit up or stand slowly to reduce the risk of dizzy or fainting spells. Drinking alcohol with this medication can increase the risk of these side effects. This medication may increase your risk of getting an infection. Call your care team for  advice if you get a fever, chills, sore throat, or other symptoms of a cold or flu. Do not treat yourself. Try to avoid being around people who are sick. Avoid taking medications that contain aspirin, acetaminophen, ibuprofen, naproxen, or ketoprofen unless instructed by your care team. These medications may hide a fever. This medication may increase your risk to bruise or bleed. Call your care team if you notice any unusual bleeding. Be careful brushing or flossing your teeth or using a toothpick because you may get an infection or bleed more easily. If you have any dental work done, tell your dentist you are receiving this medication. Talk to your care team if you or your partner are pregnant or think either of you might be pregnant. This medication can cause serious birth defects if taken during pregnancy and for 6 months after the last dose. You will need a negative pregnancy test before starting this medication. Contraception is recommended while taking this medication and for 6 months after the last dose. Your care team can help you find the option that works for you. Do not father a child while taking this medication and for 3 months after the last dose. Use a condom for contraception during this time period. Do not breastfeed while taking this medication and for 7 days after the last dose. This medication may cause   infertility. Talk to your care team if you are concerned about your fertility. What side effects may I notice from receiving this medication? Side effects that you should report to your care team as soon as possible: Allergic reactions--skin rash, itching, hives, swelling of the face, lips, tongue, or throat Dry cough, shortness of breath or trouble breathing Increased saliva or tears, increased sweating, stomach cramping, diarrhea, small pupils, unusual weakness or fatigue, slow heartbeat Infection--fever, chills, cough, sore throat, wounds that don't heal, pain or trouble when  passing urine, general feeling of discomfort or being unwell Kidney injury--decrease in the amount of urine, swelling of the ankles, hands, or feet Low red blood cell level--unusual weakness or fatigue, dizziness, headache, trouble breathing Severe or prolonged diarrhea Unusual bruising or bleeding Side effects that usually do not require medical attention (report to your care team if they continue or are bothersome): Constipation Diarrhea Hair loss Loss of appetite Nausea Stomach pain This list may not describe all possible side effects. Call your doctor for medical advice about side effects. You may report side effects to FDA at 1-800-FDA-1088. Where should I keep my medication? This medication is given in a hospital or clinic. It will not be stored at home. NOTE: This sheet is a summary. It may not cover all possible information. If you have questions about this medicine, talk to your doctor, pharmacist, or health care provider.  2023 [employer] Standard (2021-09-01 00:00:00) Leucovorin Injection What is this medication? LEUCOVORIN (loo koe VOR in) prevents side effects from certain medications, such as methotrexate. It works by increasing folate levels. This helps protect healthy cells in your body. It may also be used to treat anemia caused by low levels of folate. It can also be used with fluorouracil, a type of chemotherapy, to treat colorectal cancer. It works by increasing the effects of fluorouracil in the body. This medicine may be used for other purposes; ask your health care provider or pharmacist if you have questions. What should I tell my care team before I take this medication? They need to know if you have any of these conditions: Anemia from low levels of vitamin B12 in the blood An unusual or allergic reaction to leucovorin, folic acid, other medications, foods, dyes, or preservatives Pregnant or trying to get pregnant Breastfeeding How should I use this  medication? This medication is injected into a vein or a muscle. It is given by your care team in a hospital or clinic setting. Talk to your care team about the use of this medication in children. Special care may be needed. Overdosage: If you think you have taken too much of this medicine contact a poison control center or emergency room at once. NOTE: This medicine is only for you. Do not share this medicine with others. What if I miss a dose? Keep appointments for follow-up doses. It is important not to miss your dose. Call your care team if you are unable to keep an appointment. What may interact with this medication? Capecitabine Fluorouracil Phenobarbital Phenytoin Primidone Trimethoprim;sulfamethoxazole This list may not describe all possible interactions. Give your health care provider a list of all the medicines, herbs, non-prescription drugs, or dietary supplements you use. Also tell them if you smoke, drink alcohol, or use illegal drugs. Some items may interact with your medicine. What should I watch for while using this medication? Your condition will be monitored carefully while you are receiving this medication. This medication may increase the side effects of 5-fluorouracil. Tell your care   team if you have diarrhea or mouth sores that do not get better or that get worse. What side effects may I notice from receiving this medication? Side effects that you should report to your care team as soon as possible: Allergic reactions--skin rash, itching, hives, swelling of the face, lips, tongue, or throat This list may not describe all possible side effects. Call your doctor for medical advice about side effects. You may report side effects to FDA at 1-800-FDA-1088. Where should I keep my medication? This medication is given in a hospital or clinic. It will not be stored at home. NOTE: This sheet is a summary. It may not cover all possible information. If you have questions about this  medicine, talk to your doctor, pharmacist, or health care provider.  2023 [employer]/Gold Standard (2021-09-02 00:00:00) Bevacizumab Injection What is this medication? BEVACIZUMAB (be va SIZ yoo mab) treats some types of cancer. It works by blocking a protein that causes cancer cells to grow and multiply. This helps to slow or stop the spread of cancer cells. It is a monoclonal antibody. This medicine may be used for other purposes; ask your health care provider or pharmacist if you have questions. COMMON BRAND NAME(S): Alymsys, Avastin, MVASI, Zirabev What should I tell my care team before I take this medication? They need to know if you have any of these conditions: Blood clots Coughing up blood Having or recent surgery Heart failure High blood pressure History of a connection between 2 or more body parts that do not usually connect (fistula) History of a tear in your stomach or intestines Protein in your urine An unusual or allergic reaction to bevacizumab, other medications, foods, dyes, or preservatives Pregnant or trying to get pregnant Breast-feeding How should I use this medication? This medication is injected into a vein. It is given by your care team in a hospital or clinic setting. Talk to your care team the use of this medication in children. Special care may be needed. Overdosage: If you think you have taken too much of this medicine contact a poison control center or emergency room at once. NOTE: This medicine is only for you. Do not share this medicine with others. What if I miss a dose? Keep appointments for follow-up doses. It is important not to miss your dose. Call your care team if you are unable to keep an appointment. What may interact with this medication? Interactions are not expected. This list may not describe all possible interactions. Give your health care provider a list of all the medicines, herbs, non-prescription drugs, or dietary supplements you use. Also  tell them if you smoke, drink alcohol, or use illegal drugs. Some items may interact with your medicine. What should I watch for while using this medication? Your condition will be monitored carefully while you are receiving this medication. You may need blood work while taking this medication. This medication may make you feel generally unwell. This is not uncommon as chemotherapy can affect healthy cells as well as cancer cells. Report any side effects. Continue your course of treatment even though you feel ill unless your care team tells you to stop. This medication may increase your risk to bruise or bleed. Call your care team if you notice any unusual bleeding. Before having surgery, talk to your care team to make sure it is ok. This medication can increase the risk of poor healing of your surgical site or wound. You will need to stop this medication for 28 days before   surgery. After surgery, wait at least 28 days before restarting this medication. Make sure the surgical site or wound is healed enough before restarting this medication. Talk to your care team if questions. Talk to your care team if you may be pregnant. Serious birth defects can occur if you take this medication during pregnancy and for 6 months after the last dose. Contraception is recommended while taking this medication and for 6 months after the last dose. Your care team can help you find the option that works for you. Do not breastfeed while taking this medication and for 6 months after the last dose. This medication can cause infertility. Talk to your care team if you are concerned about your fertility. What side effects may I notice from receiving this medication? Side effects that you should report to your care team as soon as possible: Allergic reactions--skin rash, itching, hives, swelling of the face, lips, tongue, or throat Bleeding--bloody or black, tar-like stools, vomiting blood or brown material that looks like coffee  grounds, red or dark brown urine, small red or purple spots on skin, unusual bruising or bleeding Blood clot--pain, swelling, or warmth in the leg, shortness of breath, chest pain Heart attack--pain or tightness in the chest, shoulders, arms, or jaw, nausea, shortness of breath, cold or clammy skin, feeling faint or lightheaded Heart failure--shortness of breath, swelling of the ankles, feet, or hands, sudden weight gain, unusual weakness or fatigue Increase in blood pressure Infection--fever, chills, cough, sore throat, wounds that don't heal, pain or trouble when passing urine, general feeling of discomfort or being unwell Infusion reactions--chest pain, shortness of breath or trouble breathing, feeling faint or lightheaded Kidney injury--decrease in the amount of urine, swelling of the ankles, hands, or feet Stomach pain that is severe, does not go away, or gets worse Stroke--sudden numbness or weakness of the face, arm, or leg, trouble speaking, confusion, trouble walking, loss of balance or coordination, dizziness, severe headache, change in vision Sudden and severe headache, confusion, change in vision, seizures, which may be signs of posterior reversible encephalopathy syndrome (PRES) Side effects that usually do not require medical attention (report to your care team if they continue or are bothersome): Back pain Change in taste Diarrhea Dry skin Increased tears Nosebleed This list may not describe all possible side effects. Call your doctor for medical advice about side effects. You may report side effects to FDA at 1-800-FDA-1088. Where should I keep my medication? This medication is given in a hospital or clinic. It will not be stored at home. NOTE: This sheet is a summary. It may not cover all possible information. If you have questions about this medicine, talk to your doctor, pharmacist, or health care provider.  2023 [employer] Standard (2021-09-06 00:00:00)  

## 2022-01-25 ENCOUNTER — Encounter: Payer: Self-pay | Admitting: Oncology

## 2022-01-26 ENCOUNTER — Inpatient Hospital Stay: Payer: BC Managed Care – PPO

## 2022-01-26 VITALS — BP 148/100 | HR 75 | Temp 97.7°F | Resp 18 | Ht 78.0 in | Wt 263.0 lb

## 2022-01-26 DIAGNOSIS — F419 Anxiety disorder, unspecified: Secondary | ICD-10-CM | POA: Diagnosis not present

## 2022-01-26 DIAGNOSIS — C187 Malignant neoplasm of sigmoid colon: Secondary | ICD-10-CM | POA: Diagnosis not present

## 2022-01-26 DIAGNOSIS — Z9221 Personal history of antineoplastic chemotherapy: Secondary | ICD-10-CM | POA: Diagnosis not present

## 2022-01-26 DIAGNOSIS — Z5111 Encounter for antineoplastic chemotherapy: Secondary | ICD-10-CM | POA: Diagnosis not present

## 2022-01-26 DIAGNOSIS — D500 Iron deficiency anemia secondary to blood loss (chronic): Secondary | ICD-10-CM | POA: Diagnosis not present

## 2022-01-26 DIAGNOSIS — C787 Secondary malignant neoplasm of liver and intrahepatic bile duct: Secondary | ICD-10-CM

## 2022-01-26 DIAGNOSIS — Z5112 Encounter for antineoplastic immunotherapy: Secondary | ICD-10-CM | POA: Diagnosis not present

## 2022-01-26 DIAGNOSIS — K649 Unspecified hemorrhoids: Secondary | ICD-10-CM | POA: Diagnosis not present

## 2022-01-26 MED ORDER — HEPARIN SOD (PORK) LOCK FLUSH 100 UNIT/ML IV SOLN
500.0000 [IU] | Freq: Once | INTRAVENOUS | Status: AC | PRN
  Administered 2022-01-26: 500 [IU]

## 2022-01-26 MED ORDER — SODIUM CHLORIDE 0.9% FLUSH
10.0000 mL | INTRAVENOUS | Status: DC | PRN
  Administered 2022-01-26: 10 mL

## 2022-01-26 NOTE — Patient Instructions (Signed)
The chemotherapy medication bag should finish at 46 hours, 96 hours, or 7 days. For example, if your pump is scheduled for 46 hours and it was put on at 4:00 p.m., it should finish at 2:00 p.m. the day it is scheduled to come off regardless of your appointment time.     Estimated time to finish at 1247Fluorouracil Injection What is this medication? FLUOROURACIL (flure oh YOOR a sil) treats some types of cancer. It works by slowing down the growth of cancer cells. This medicine may be used for other purposes; ask your health care provider or pharmacist if you have questions. COMMON BRAND NAME(S): Adrucil What should I tell my care team before I take this medication? They need to know if you have any of these conditions: Blood disorders Dihydropyrimidine dehydrogenase (DPD) deficiency Infection, such as chickenpox, cold sores, herpes Kidney disease Liver disease Poor nutrition Recent or ongoing radiation therapy An unusual or allergic reaction to fluorouracil, other medications, foods, dyes, or preservatives If you or your partner are pregnant or trying to get pregnant Breast-feeding How should I use this medication? This medication is injected into a vein. It is administered by your care team in a hospital or clinic setting. Talk to your care team about the use of this medication in children. Special care may be needed. Overdosage: If you think you have taken too much of this medicine contact a poison control center or emergency room at once. NOTE: This medicine is only for you. Do not share this medicine with others. What if I miss a dose? Keep appointments for follow-up doses. It is important not to miss your dose. Call your care team if you are unable to keep an appointment. What may interact with this medication? Do not take this medication with any of the following: Live virus vaccines This medication may also interact with the following: Medications that treat or prevent blood  clots, such as warfarin, enoxaparin, dalteparin This list may not describe all possible interactions. Give your health care provider a list of all the medicines, herbs, non-prescription drugs, or dietary supplements you use. Also tell them if you smoke, drink alcohol, or use illegal drugs. Some items may interact with your medicine. What should I watch for while using this medication? Your condition will be monitored carefully while you are receiving this medication. This medication may make you feel generally unwell. This is not uncommon as chemotherapy can affect healthy cells as well as cancer cells. Report any side effects. Continue your course of treatment even though you feel ill unless your care team tells you to stop. In some cases, you may be given additional medications to help with side effects. Follow all directions for their use. This medication may increase your risk of getting an infection. Call your care team for advice if you get a fever, chills, sore throat, or other symptoms of a cold or flu. Do not treat yourself. Try to avoid being around people who are sick. This medication may increase your risk to bruise or bleed. Call your care team if you notice any unusual bleeding. Be careful brushing or flossing your teeth or using a toothpick because you may get an infection or bleed more easily. If you have any dental work done, tell your dentist you are receiving this medication. Avoid taking medications that contain aspirin, acetaminophen, ibuprofen, naproxen, or ketoprofen unless instructed by your care team. These medications may hide a fever. Do not treat diarrhea with over the counter products. Contact  your care team if you have diarrhea that lasts more than 2 days or if it is severe and watery. This medication can make you more sensitive to the sun. Keep out of the sun. If you cannot avoid being in the sun, wear protective clothing and sunscreen. Do not use sun lamps, tanning beds, or  tanning booths. Talk to your care team if you or your partner wish to become pregnant or think you might be pregnant. This medication can cause serious birth defects if taken during pregnancy and for 3 months after the last dose. A reliable form of contraception is recommended while taking this medication and for 3 months after the last dose. Talk to your care team about effective forms of contraception. Do not father a child while taking this medication and for 3 months after the last dose. Use a condom while having sex during this time period. Do not breastfeed while taking this medication. This medication may cause infertility. Talk to your care team if you are concerned about your fertility. What side effects may I notice from receiving this medication? Side effects that you should report to your care team as soon as possible: Allergic reactions--skin rash, itching, hives, swelling of the face, lips, tongue, or throat Heart attack--pain or tightness in the chest, shoulders, arms, or jaw, nausea, shortness of breath, cold or clammy skin, feeling faint or lightheaded Heart failure--shortness of breath, swelling of the ankles, feet, or hands, sudden weight gain, unusual weakness or fatigue Heart rhythm changes--fast or irregular heartbeat, dizziness, feeling faint or lightheaded, chest pain, trouble breathing High ammonia level--unusual weakness or fatigue, confusion, loss of appetite, nausea, vomiting, seizures Infection--fever, chills, cough, sore throat, wounds that don't heal, pain or trouble when passing urine, general feeling of discomfort or being unwell Low red blood cell level--unusual weakness or fatigue, dizziness, headache, trouble breathing Pain, tingling, or numbness in the hands or feet, muscle weakness, change in vision, confusion or trouble speaking, loss of balance or coordination, trouble walking, seizures Redness, swelling, and blistering of the skin over hands and feet Severe or  prolonged diarrhea Unusual bruising or bleeding Side effects that usually do not require medical attention (report to your care team if they continue or are bothersome): Dry skin Headache Increased tears Nausea Pain, redness, or swelling with sores inside the mouth or throat Sensitivity to light Vomiting This list may not describe all possible side effects. Call your doctor for medical advice about side effects. You may report side effects to FDA at 1-800-FDA-1088. Where should I keep my medication? This medication is given in a hospital or clinic. It will not be stored at home. NOTE: This sheet is a summary. It may not cover all possible information. If you have questions about this medicine, talk to your doctor, pharmacist, or health care provider.  2023 Elsevier/Gold Standard (2021-08-30 00:00:00) .   If the display on your pump reads "Low Volume" and it is beeping, take the batteries out of the pump and come to the cancer center for it to be taken off.   If the pump alarms go off prior to the pump reading "Low Volume" then call [phone] and someone can assist you.  If the plunger comes out and the chemotherapy medication is leaking out, please use your home chemo spill kit to clean up the spill. Do NOT use paper towels or other household products.  If you have problems or questions regarding your pump, please call either [phone] (24 hours a day) or  the cancer center Monday-Friday 8:00 a.m.- 4:30 p.m. at the clinic number and we will assist you. If you are unable to get assistance, then go to the nearest Emergency Department and ask the staff to contact the IV team for assistance.       [facility]  Discharge Instructions: Thank you for choosing [facility] to provide your oncology and hematology care.  If you have a lab appointment with the [facility], please go directly to the [facility] and check in at the registration  area.   Wear comfortable clothing and clothing appropriate for easy access to any Portacath or PICC line.   We strive to give you quality time with your provider. You may need to reschedule your appointment if you arrive late (15 or more minutes).  Arriving late affects you and other patients whose appointments are after yours.  Also, if you miss three or more appointments without notifying the office, you may be dismissed from the clinic at the provider's discretion.      For prescription refill requests, have your pharmacy contact our office and allow 72 hours for refills to be completed.    Today you received the following chemotherapy and/or immunotherapy agents 5FLOURUORACIL      To help prevent nausea and vomiting after your treatment, we encourage you to take your nausea medication as directed.  BELOW ARE SYMPTOMS THAT SHOULD BE REPORTED IMMEDIATELY: *FEVER GREATER THAN 100.4 F (38 C) OR HIGHER *CHILLS OR SWEATING *NAUSEA AND VOMITING THAT IS NOT CONTROLLED WITH YOUR NAUSEA MEDICATION *UNUSUAL SHORTNESS OF BREATH *UNUSUAL BRUISING OR BLEEDING *URINARY PROBLEMS (pain or burning when urinating, or frequent urination) *BOWEL PROBLEMS (unusual diarrhea, constipation, pain near the anus) TENDERNESS IN MOUTH AND THROAT WITH OR WITHOUT PRESENCE OF ULCERS (sore throat, sores in mouth, or a toothache) UNUSUAL RASH, SWELLING OR PAIN  UNUSUAL VAGINAL DISCHARGE OR ITCHING   Items with * indicate a potential emergency and should be followed up as soon as possible or go to the Emergency Department if any problems should occur.  Please show the CHEMOTHERAPY ALERT CARD or IMMUNOTHERAPY ALERT CARD at check-in to the Emergency Department and triage nurse.  Should you have questions after your visit or need to cancel or reschedule your appointment, please contact [facility]  Dept: [phone]  and follow the prompts.  Office hours are 8:00 a.m. to 4:30 p.m. Monday -  Friday. Please note that voicemails left after 4:00 p.m. may not be returned until the following business day.  We are closed weekends and major holidays. You have access to a nurse at all times for urgent questions. Please call the main number to the clinic Dept: [phone] and follow the prompts.  For any non-urgent questions, you may also contact your provider using MyChart. We now offer e-Visits for anyone 57 and older to request care online for non-urgent symptoms. For details visit [email or web address].   Also download the MyChart app! Go to the app store, search "MyChart", open the app, select [facility], and log in with your MyChart username and password.  Masks are optional in the cancer centers. If you would like for your care team to wear a mask while they are taking care of you, please let them know. You may have one support person who is at least 52 years old accompany you for your appointments.

## 2022-01-29 ENCOUNTER — Encounter: Payer: Self-pay | Admitting: Oncology

## 2022-02-02 NOTE — Assessment & Plan Note (Addendum)
Microcytic anemia, which is stable.  He denies any overt form of blood loss.  Iron studies were equivocal in July.  He was placed on ferrous sulfate daily with improvement in his hemoglobin.  We will continue ferrous sulfate daily for at least another 6 weeks.

## 2022-02-02 NOTE — Assessment & Plan Note (Addendum)
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  He was treated with surgical resection followed by 6 months of adjuvant chemotherapy with FOLFOX. He had ascites and peritoneal nodules in 2018 with negative biopsy and negative fluid cytology.  He was taken for exploratory laparotomy with removal of the nodule which is found to be benign.  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  We discussed postoperative chemotherapy, but ultimately decided on surveillance.    He was found to have a another solitary metastasis to the liveron MRI scan in May 2023.  He was returned referred to Dr. Rolla Etienne and underwent surgical resection on June 13th.  He did have postoperative complications including biliary leak and had ERCP with placement of an endoscopic stent into the biliary duct.  The was readmitted on June 27 with abscess and perihepatic fluid collection that required placement of a drain.  He had fevers and has felt very poorly since that time.  He was then found to have pleural effusion on July 1 treated with a right thoracoscopic total pulmonary decortication.  We recommended postoperative chemotherapy with FOLFIRI/bevacizumab.

## 2022-02-02 NOTE — Progress Notes (Signed)
[facility]  [address] [place],  [place]  [address] [phone]  Clinic Day:  02/03/2022  Referring physician: Angelina Sheriff, MD  ASSESSMENT & PLAN:   Assessment & Plan: Malignant neoplasm metastatic to liver [facility]) History of liver metastasis in January 2021 treated with resection alone.  He had recurrent liver metastasis treated with a partial hepatectomy in June.  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  There was 1 negative node but margins were close.  This was attached to the diaphragm but the tissue from the diaphragm is benign.  He had a complicated postoperative course developing an cyst requiring drainage.  He then developed a right pleural effusion in July treated with a VATS procedure.  He is receiving chemotherapy with FOLFIRI and Avastin, which he started in August.  His third cycle of chemotherapy was delayed as he was admitted with sepsis.  He completed IV antibiotics as an outpatient.  As his hemorrhoid is not responding to over-the-counter medication, I will prescribe Anusol HC cream to apply twice daily.  We will proceed with a 4th cycle of chemotherapy next week.  We will plan to see him back in 2 weeks for repeat clinical assessment prior to a 5th cycle.  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  He was treated with surgical resection followed by 6 months of adjuvant chemotherapy with FOLFOX. He had ascites and peritoneal nodules in 2018 with negative biopsy and negative fluid cytology.  He was taken for exploratory laparotomy with removal of the nodule which is found to be benign.  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  We discussed postoperative chemotherapy, but ultimately decided on surveillance.    He was found to have a another solitary metastasis to the  liveron MRI scan in May 2023.  He was returned referred to Dr. Rolla Etienne and underwent surgical resection on June 13th.  He did have postoperative complications including biliary leak and had ERCP with placement of an endoscopic stent into the biliary duct.  The was readmitted on June 27 with abscess and perihepatic fluid collection that required placement of a drain.  He had fevers and has felt very poorly since that time.  He was then found to have pleural effusion on July 1 treated with a right thoracoscopic total pulmonary decortication.  We recommended postoperative chemotherapy with FOLFIRI/bevacizumab.  Iron deficiency anemia due to chronic blood loss Microcytic anemia, which is stable.  He denies any overt form of blood loss.  Iron studies were equivocal in July.  He was placed on ferrous sulfate daily with improvement in his hemoglobin.  We will continue ferrous sulfate daily for at least another 6 weeks.   The patient understands the plans discussed today and is in agreement with them.  He knows to contact our office if he develops concerns prior to his next appointment.   I provided 15 minutes of face-to-face time during this encounter and > 50% was spent counseling as documented under my assessment and plan.    Eduardo Pickles, PA-C  [facility] [address] [place] [address] Dept: [phone] Dept Fax: [phone]   Orders Placed This Encounter  Procedures   CBC and differential    This external order was created through the  Results Console.   CBC    This external order was created through the Results Console.   Basic metabolic panel    This external order was created through the Results Console.   Comprehensive metabolic panel    This external order was created through the Results Console.   Hepatic function panel    This external order was created through the Results Console.      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  He presented with fevers and chills in September 2017 and was found to have diverticulitis, which was treated.  CT at that time revealed a 1.7 cm lesion in the liver, which was nonspecific, as well as ectopic left kidney with a cyst.  He was then brought back for a colonoscopy, which revealed a 3.5 cm mass in the sigmoid.  Biopsy revealed high-grade dysplasia.  CT abdomen and pelvis revealed an indeterminate lesion in the liver in addition to wall thickening in the sigmoid colon.  MRI abdomen revealed the lesion in the liver to represent complex cyst versus vascular lesion, however, follow-up in 6 months was recommended.  Air contrast barium enema revealed an apple-core lesion of the sigmoid colon.  Baseline CEA was 0.6.  He underwent surgical resection in November 2017.  Pathology revealed a 4 cm, grade 2, adenocarcinoma of the sigmoid colon, as well as chronic active diverticulitis with an area of perforation, however, the tumor was not perforated.  Tumor invaded through the muscularis propria into the pericolorectal tissues.  4/21 nodes were positive for metastasis.  Margins were clear.  MMR and MSI were normal.  KRAS and BRAF mutations were negative.  He had iron deficiency treated with oral iron supplement in the form of Hemocyte daily.  Due to his age of diagnosis and family history he underwent testing for hereditary non polyposis colorectal cancer with the Myriad myRisk Hereditary Cancer Panel test.  This did not reveal any clinically significant mutation or variants of uncertain significance.  Repeat MRI abdomen in January 2018 revealed a stable lesion within the right lobe of the liver, most consistent with benign lesion.  There was new small volume ascites seen.  The patient received adjuvant  chemotherapy with FOLFOX  for 12 cycles, which was completed in June.  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  He was placed on gabapentin in July, then noticed abdominal bloating, which he attributed to the gabapentin, so he discontinued that after 1 month.  He was seen for routine follow-up in September 2018 with a repeat MRI abdomen to re-evaluate the liver lesion.  That revealed a large volume ascites with findings suspicious for peritoneal disease.  The lesion in the right lobe of the liver was stable and still assistant with a benign lesion.  The patient underwent paracentesis with removal of 4.7 L of fluid, but fluid cytology was negative for malignancy.  PET scan in early October did not reveal any hypermetabolic activity, but moderate ascites was seen.  Ultrasound-guided peritoneal biopsy was negative for malignancy, revealing adipose tissue with fibrosis, patchy inflammation and minimal atypia.  His case was discussed at tumor conference and he was referred to Dr. Noberto Retort for consideration open biopsy.  Dr. Noberto Retort performed colonoscopy in October 2018, which was negative.  Follow-up colonoscopy in 3 years was recommended.  The patient then underwent exploratory laparotomy with multiple peritoneal biopsies.  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  We therefore recommended observation for the patient.  In November 2018, he was seen in the emergency room with abdominal pain, nausea and vomiting.  Gallbladder ultrasound revealed cholelithiasis with sludge and polyps, as well as mild gallbladder wall thickening.  Mild ascites was also seen.  CT abdomen and pelvis revealed mild diffuse gallbladder wall edema otherwise unchanged from PET-CT done in October.  He underwent cholecystectomy with findings of acute  cholecystitis.  Pathology did not reveal any evidence of malignancy.    He was seen off schedule in April 2019, as he presented to Dr. Janace Aris office with abdominal pain and was found to have an elevated bilirubin and liver transaminases.  Due to the laboratory abnormalities, he underwent repeat MRI abdomen at that time, which revealed a stable lesion in the right hepatic lobe, which was felt to be indeterminate, with a 2nd smaller indeterminate lesion in the right hepatic lobe measuring 12 mm, which was new from previous imaging.  No hepatic steatosis or ascites was seen.  CEA was normal.  Hepatitis panel and CMV were negative  we felt the laboratory abnormalities were most likely secondary to viral illness.  He was followed closely and underwent a repeat MRI abdomen in June, which remained stable. He had one again in December of 2019, which was stable.  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  MRI abdomen in January 2021 revealed the mass in the junction of segments 7 and 8 in the liver has increased in size compared to the prior exam, currently 2.9 x 2.1 x 2.5 cm.  The lesion has a branching component, some of which may be from portal vein thrombus or tumor thrombus, even this branching component appears to enhance on subtraction images.   Biopsy in February revealed adenocarcinoma with necrosis, consistent with metastasis from colon primary.  He underwent surgical resection in February with Dr. Rolla Etienne at [facility].  We discussed the option of further chemotherapy, including the risks and benefits, and he opted for surveillance.  MRI abdomen in May 2023 revealed another solitary metastasis in the liver.  Treated with partial hepatectomy in June with  Dr. Cecil Cobbs.  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  There was 1 negative node, but margins were close.  This was attached to the diaphragm, but the tissue from the diaphragm was benign.  He had a complicated postoperative course developing an cyst requiring drainage.  He then developed a right pleural effusion in July treated with a VATS procedure.  He is receiving chemotherapy with FOLFIRI and Avastin, which he started in August   Oncology History  Colon cancer [facility])  01/23/2016 Cancer Staging   Staging form: Colon and Rectum, AJCC 7th Edition - Clinical stage from 01/23/2016: Stage IIIB (T3, N2a, M0) - Signed by Derwood Kaplan, MD on 05/03/2020 Staging comments: 3.5 cm MMR, MSI normal, No mutation of KRAS or BRAF, neg.genetics Rec'd 6 months adjuvant FOLFOX chemotherapy   02/02/2016 Initial Diagnosis   Colon cancer ([place])  12/13/2021 - 12/29/2021 Chemotherapy   Patient is on Treatment Plan : COLORECTAL FOLFIRI / BEVACIZUMAB Q14D     12/13/2021 -  Chemotherapy   Patient is on Treatment Plan : COLORECTAL FOLFIRI + Bevacizumab q14d     Malignant neoplasm metastatic to liver ([facility])  05/29/2019 Initial Diagnosis   Malignant neoplasm metastatic to liver ([facility])   12/13/2021 - 12/29/2021 Chemotherapy   Patient is on Treatment Plan : COLORECTAL FOLFIRI / BEVACIZUMAB Q14D     12/13/2021 -  Chemotherapy   Patient is on Treatment Plan : COLORECTAL FOLFIRI + Bevacizumab q14d         INTERVAL HISTORY:  Eduardo Hester is here today for repeat clinical assessment prior to a fourth cycle of FOLFIRI/bevacizumab.  We had trouble obtaining blood return from his Port-A-Cath 2 weeks ago.  He had alteplase and initially we still could not get blood return.  A few days later prior to his chemotherapy, blood return was normal..  He states he continues to tolerate chemotherapy without significant difficulty.  He does have difficulty sleeping on day 1 of chemotherapy despite  Ativan 1 mg at bedtime.  He denies diarrhea.  He does report a external hemorrhoid not responding to preparation H wipes or gel.  He denies bleeding from the hemorrhoid.  He denies melena.  He reports stable neuropathy of the hands and feet. He denies fevers, chills or other symptoms of infection. He denies pain. His appetite is good. His weight has been stable.  He continues ferrous sulfate daily.  REVIEW OF SYSTEMS:  Review of Systems  Constitutional:  Negative for appetite change, chills, fatigue, fever and unexpected weight change.  HENT:   Negative for lump/mass, mouth sores and sore throat.   Respiratory:  Negative for cough and shortness of breath.   Cardiovascular:  Negative for chest pain and leg swelling.  Gastrointestinal:  Negative for abdominal pain, constipation, diarrhea, nausea and vomiting.  Genitourinary:  Negative for difficulty urinating, dysuria, frequency and hematuria.   Musculoskeletal:  Negative for arthralgias, back pain and myalgias.  Skin:  Negative for itching, rash and wound.  Neurological:  Negative for dizziness, extremity weakness, headaches, light-headedness and numbness.  Hematological:  Negative for adenopathy.  Psychiatric/Behavioral:  Negative for depression and sleep disturbance. The patient is not nervous/anxious.      VITALS:  Blood pressure (!) 133/92, pulse (!) 104, temperature 99.2 F (37.3 C), temperature source Oral, resp. rate 20, height 6' 6"  (1.981 m), weight 263 lb 11.2 oz (119.6 kg), SpO2 93 %.  Wt Readings from Last 3 Encounters:  02/03/22 263 lb 11.2 oz (119.6 kg)  01/26/22 263 lb (119.3 kg)  01/24/22 263 lb (119.3 kg)    Body mass index is 30.47 kg/m.  Performance status (ECOG): 1 - Symptomatic but completely ambulatory  PHYSICAL EXAM:  Physical Exam Vitals and nursing note reviewed.  Constitutional:      General: He is not in acute distress.    Appearance: Normal appearance. He is normal weight.  HENT:     Head: Normocephalic  and atraumatic.     Mouth/Throat:     Mouth: Mucous membranes are moist.     Pharynx: Oropharynx is clear. No oropharyngeal exudate or posterior oropharyngeal erythema.  Eyes:     General: No scleral icterus.    Extraocular Movements: Extraocular movements intact.     Conjunctiva/sclera: Conjunctivae normal.     Pupils: Pupils are equal, round, and reactive to light.  Cardiovascular:     Rate and Rhythm:  Normal rate and regular rhythm.     Heart sounds: Normal heart sounds. No murmur heard.    No friction rub. No gallop.  Pulmonary:     Effort: Pulmonary effort is normal.     Breath sounds: Normal breath sounds. No wheezing, rhonchi or rales.  Abdominal:     General: Bowel sounds are normal. There is no distension.     Palpations: Abdomen is soft. There is no hepatomegaly, splenomegaly or mass.     Tenderness: There is no abdominal tenderness.  Musculoskeletal:        General: Normal range of motion.     Cervical back: Normal range of motion and neck supple. No tenderness.     Right lower leg: No edema.     Left lower leg: No edema.  Lymphadenopathy:     Cervical: No cervical adenopathy.     Upper Body:     Right upper body: No supraclavicular or axillary adenopathy.     Left upper body: No supraclavicular or axillary adenopathy.     Lower Body: No right inguinal adenopathy. No left inguinal adenopathy.  Skin:    General: Skin is warm and dry.     Coloration: Skin is not jaundiced.     Findings: No rash.  Neurological:     Mental Status: He is alert and oriented to person, place, and time.     Cranial Nerves: No cranial nerve deficit.  Psychiatric:        Mood and Affect: Mood normal.        Behavior: Behavior normal.        Thought Content: Thought content normal.     LABS:      Latest Ref Rng & Units 02/03/2022   12:00 AM 01/20/2022   12:00 AM 01/06/2022   12:00 AM  CBC  WBC  5.6     4.1     3.7      Hemoglobin 13.5 - 17.5 12.1     12.0     11.5      Hematocrit 41  - 53 37     37     36      Platelets 150 - 400 K/uL 254     330     287         This result is from an external source.      Latest Ref Rng & Units 02/03/2022   12:00 AM 01/20/2022   12:00 AM 01/06/2022   12:00 AM  CMP  BUN 4 - 21 15     12     17       Creatinine 0.6 - 1.3 1.0     0.9     1.3      Sodium 137 - 147 139     140     140      Potassium 3.5 - 5.1 mEq/L 4.2     3.9     4.3      Chloride 99 - 108 106     108     102      CO2 13 - 22 26     24     31       Calcium 8.7 - 10.7 9.2     9.3     9.4      Alkaline Phos 25 - 125 103     136     106      AST 14 - 40 32  50     53      ALT 10 - 40 U/L 34     60     39         This result is from an external source.     Lab Results  Component Value Date   CEA1 0.8 01/06/2022   /  CEA  Date Value Ref Range Status  01/06/2022 0.8 0.0 - 4.7 ng/mL Final    Comment:    (NOTE)                             Nonsmokers          <3.9                             Smokers             <5.6 [employer] Electrochemiluminescence Immunoassay (ECLIA) Values obtained with different assay methods or kits cannot be used interchangeably.  Results cannot be interpreted as absolute evidence of the presence or absence of malignant disease. Performed At: [facility] [place], [place] [id number] Rush Farmer MD [id number]    No results found for: "PSA1" No results found for: "CAN199" No results found for: "CAN125"  No results found for: "TOTALPROTELP", "ALBUMINELP", "A1GS", "A2GS", "BETS", "BETA2SER", "GAMS", "MSPIKE", "SPEI" Lab Results  Component Value Date   TIBC 225 (L) 11/22/2021   FERRITIN 292 11/22/2021   IRONPCTSAT 6 (L) 11/22/2021   No results found for: "LDH"  STUDIES:  No results found.    HISTORY:   Past Medical History:  Diagnosis Date   GERD (gastroesophageal reflux disease)    Renal lithiasis     Past Surgical History:  Procedure Laterality Date   LIVER RESECTION Right  07/02/2019   LIVER RESECTION Right 10/12/2021   SIGMOIDECTOMY  03/2016    Family History  Problem Relation Age of Onset   Lung cancer Father 55   Leukemia Father 28       acute myelogenous   Colon cancer Sister 2    Social History:  reports that he has quit smoking. He has never used smokeless tobacco. He reports that he does not currently use alcohol. No history on file for drug use.The patient is accompanied by his wife today.  Allergies: No Known Allergies  Current Medications: Current Outpatient Medications  Medication Sig Dispense Refill   hydrocortisone (ANUSOL-HC) 2.5 % rectal cream Place 1 Application rectally 2 (two) times daily. 30 g 0   ascorbic acid (VITAMIN C) 1000 MG tablet Take by mouth.     benzonatate (TESSALON) 200 MG capsule Take 1 capsule (200 mg total) by mouth 3 (three) times daily as needed for cough. 20 capsule 1   Calcium Polycarbophil (FIBER) 625 MG TABS Take by mouth.     cetirizine (ZYRTEC) 10 MG tablet Take by mouth.     Cholecalciferol (VITAMIN D) 50 MCG (2000 UT) tablet Take by mouth.     Docusate Sodium (DSS) 100 MG CAPS Take 1 capsule by mouth daily.     ferrous sulfate 325 (65 FE) MG tablet Take 1 capsule by mouth every morning.     loperamide (IMODIUM) 2 MG capsule Take 1 capsule (2 mg total) by mouth as needed for diarrhea or loose stools. Take 2 at diarrhea onset , then 1 every 2hr until 12hrs with no BM. May take 2 every  4hrs at night. If diarrhea recurs repeat. 100 capsule 5   LORazepam (ATIVAN) 1 MG tablet Take 1 tablet (1 mg total) by mouth every 6 (six) hours as needed for anxiety or sleep. 100 tablet 0   Multiple Vitamin (MULTI-VITAMIN) tablet Take 1 tablet by mouth daily.     omeprazole (PRILOSEC) 20 MG capsule Take by mouth.     ondansetron (ZOFRAN) 8 MG tablet Take 1 tablet (8 mg total) by mouth 2 (two) times daily as needed for refractory nausea / vomiting. Start on day 3 after chemotherapy. 30 tablet 1   ondansetron (ZOFRAN-ODT) 4 MG  disintegrating tablet Take 4 mg by mouth every 8 (eight) hours as needed.     oxyCODONE (OXY IR/ROXICODONE) 5 MG immediate release tablet Take by mouth.     Probiotic Product (PROBIOTIC BLEND PO) Take 1 tablet by mouth daily.     prochlorperazine (COMPAZINE) 10 MG tablet Take 1 tablet (10 mg total) by mouth every 6 (six) hours as needed (NAUSEA). 30 tablet 1   vitamin B-12 (CYANOCOBALAMIN) 250 MCG tablet Take by mouth.     zinc gluconate 50 MG tablet Take 50 mg by mouth daily.     No current facility-administered medications for this visit.   Facility-Administered Medications Ordered in Other Visits  Medication Dose Route Frequency Provider Last Rate Last Admin   atropine 1 MG/ML injection            palonosetron (ALOXI) 0.25 MG/5ML injection

## 2022-02-02 NOTE — Assessment & Plan Note (Addendum)
History of liver metastasis in January 2021 treated with resection alone.  He had recurrent liver metastasis treated with a partial hepatectomy in June.  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  There was 1 negative node but margins were close.  This was attached to the diaphragm but the tissue from the diaphragm is benign.  He had a complicated postoperative course developing an cyst requiring drainage.  He then developed a right pleural effusion in July treated with a VATS procedure.  He is receiving chemotherapy with FOLFIRI and Avastin, which he started in August.  His third cycle of chemotherapy was delayed as he was admitted with sepsis.  He completed IV antibiotics as an outpatient.  As his hemorrhoid is not responding to over-the-counter medication, I will prescribe Anusol HC cream to apply twice daily.  We will proceed with a 4th cycle of chemotherapy next week.  We will plan to see him back in 2 weeks for repeat clinical assessment prior to a 5th cycle.

## 2022-02-03 ENCOUNTER — Inpatient Hospital Stay: Payer: BC Managed Care – PPO

## 2022-02-03 ENCOUNTER — Encounter: Payer: Self-pay | Admitting: Hematology and Oncology

## 2022-02-03 ENCOUNTER — Inpatient Hospital Stay (HOSPITAL_BASED_OUTPATIENT_CLINIC_OR_DEPARTMENT_OTHER): Payer: BC Managed Care – PPO | Admitting: Hematology and Oncology

## 2022-02-03 ENCOUNTER — Telehealth: Payer: Self-pay | Admitting: Hematology and Oncology

## 2022-02-03 DIAGNOSIS — Z5112 Encounter for antineoplastic immunotherapy: Secondary | ICD-10-CM | POA: Diagnosis not present

## 2022-02-03 DIAGNOSIS — C787 Secondary malignant neoplasm of liver and intrahepatic bile duct: Secondary | ICD-10-CM

## 2022-02-03 DIAGNOSIS — C187 Malignant neoplasm of sigmoid colon: Secondary | ICD-10-CM

## 2022-02-03 DIAGNOSIS — Z5111 Encounter for antineoplastic chemotherapy: Secondary | ICD-10-CM | POA: Diagnosis not present

## 2022-02-03 DIAGNOSIS — D500 Iron deficiency anemia secondary to blood loss (chronic): Secondary | ICD-10-CM | POA: Diagnosis not present

## 2022-02-03 DIAGNOSIS — F419 Anxiety disorder, unspecified: Secondary | ICD-10-CM | POA: Diagnosis not present

## 2022-02-03 DIAGNOSIS — Z9221 Personal history of antineoplastic chemotherapy: Secondary | ICD-10-CM | POA: Diagnosis not present

## 2022-02-03 DIAGNOSIS — K649 Unspecified hemorrhoids: Secondary | ICD-10-CM | POA: Diagnosis not present

## 2022-02-03 LAB — CBC AND DIFFERENTIAL
HCT: 37 — AB (ref 41–53)
Hemoglobin: 12.1 — AB (ref 13.5–17.5)
MCV: 78 — AB (ref 80–94)
Neutrophils Absolute: 3.81
Platelets: 254 10*3/uL (ref 150–400)
WBC: 5.6

## 2022-02-03 LAB — BASIC METABOLIC PANEL
BUN: 15 (ref 4–21)
CO2: 26 — AB (ref 13–22)
Chloride: 106 (ref 99–108)
Creatinine: 1 (ref 0.6–1.3)
Glucose: 130
Potassium: 4.2 mEq/L (ref 3.5–5.1)
Sodium: 139 (ref 137–147)

## 2022-02-03 LAB — COMPREHENSIVE METABOLIC PANEL
Albumin: 4 (ref 3.5–5.0)
Calcium: 9.2 (ref 8.7–10.7)

## 2022-02-03 LAB — HEPATIC FUNCTION PANEL
ALT: 34 U/L (ref 10–40)
AST: 32 (ref 14–40)
Alkaline Phosphatase: 103 (ref 25–125)
Bilirubin, Total: 0.5

## 2022-02-03 LAB — CBC: RBC: 4.74 (ref 3.87–5.11)

## 2022-02-03 LAB — TOTAL PROTEIN, URINE DIPSTICK: Protein, ur: NEGATIVE mg/dL

## 2022-02-03 MED ORDER — HYDROCORTISONE (PERIANAL) 2.5 % EX CREA: 1.0000 | TOPICAL_CREAM | Freq: Two times a day (BID) | CUTANEOUS | 0 refills | Status: DC

## 2022-02-03 NOTE — Telephone Encounter (Signed)
02/03/22 next appt scheduled and confirmed with patient

## 2022-02-04 ENCOUNTER — Other Ambulatory Visit: Payer: Self-pay

## 2022-02-06 MED FILL — Fluorouracil IV Soln 2.5 GM/50ML (50 MG/ML): INTRAVENOUS | Qty: 20 | Status: AC

## 2022-02-06 MED FILL — Irinotecan HCl Inj 100 MG/5ML (20 MG/ML): INTRAVENOUS | Qty: 23 | Status: AC

## 2022-02-06 MED FILL — Dexamethasone Sodium Phosphate Inj 100 MG/10ML: INTRAMUSCULAR | Qty: 1 | Status: AC

## 2022-02-06 MED FILL — Bevacizumab-awwb IV Soln 400 MG/16ML (For Infusion): INTRAVENOUS | Qty: 24 | Status: AC

## 2022-02-06 MED FILL — Leucovorin Calcium For Inj 350 MG: INTRAMUSCULAR | Qty: 51 | Status: AC

## 2022-02-06 MED FILL — Fluorouracil IV Soln 5 GM/100ML (50 MG/ML): INTRAVENOUS | Qty: 122 | Status: AC

## 2022-02-07 ENCOUNTER — Inpatient Hospital Stay: Payer: BC Managed Care – PPO | Attending: Hematology and Oncology

## 2022-02-07 VITALS — BP 145/91 | HR 99 | Temp 98.5°F | Resp 18 | Ht 78.0 in | Wt 270.8 lb

## 2022-02-07 DIAGNOSIS — C787 Secondary malignant neoplasm of liver and intrahepatic bile duct: Secondary | ICD-10-CM | POA: Insufficient documentation

## 2022-02-07 DIAGNOSIS — Z5112 Encounter for antineoplastic immunotherapy: Secondary | ICD-10-CM | POA: Insufficient documentation

## 2022-02-07 DIAGNOSIS — C187 Malignant neoplasm of sigmoid colon: Secondary | ICD-10-CM | POA: Insufficient documentation

## 2022-02-07 DIAGNOSIS — Z79899 Other long term (current) drug therapy: Secondary | ICD-10-CM | POA: Diagnosis not present

## 2022-02-07 DIAGNOSIS — D500 Iron deficiency anemia secondary to blood loss (chronic): Secondary | ICD-10-CM | POA: Insufficient documentation

## 2022-02-07 DIAGNOSIS — Z5111 Encounter for antineoplastic chemotherapy: Secondary | ICD-10-CM | POA: Diagnosis not present

## 2022-02-07 DIAGNOSIS — R21 Rash and other nonspecific skin eruption: Secondary | ICD-10-CM | POA: Insufficient documentation

## 2022-02-07 DIAGNOSIS — Z87891 Personal history of nicotine dependence: Secondary | ICD-10-CM | POA: Insufficient documentation

## 2022-02-07 DIAGNOSIS — Z23 Encounter for immunization: Secondary | ICD-10-CM | POA: Diagnosis not present

## 2022-02-07 DIAGNOSIS — C189 Malignant neoplasm of colon, unspecified: Secondary | ICD-10-CM | POA: Diagnosis not present

## 2022-02-07 MED ORDER — SODIUM CHLORIDE 0.9 % IV SOLN
5.0000 mg/kg | Freq: Once | INTRAVENOUS | Status: AC
  Administered 2022-02-07: 600 mg via INTRAVENOUS
  Filled 2022-02-07: qty 16

## 2022-02-07 MED ORDER — SODIUM CHLORIDE 0.9 % IV SOLN
10.0000 mg | Freq: Once | INTRAVENOUS | Status: AC
  Administered 2022-02-07: 10 mg via INTRAVENOUS
  Filled 2022-02-07: qty 10

## 2022-02-07 MED ORDER — IRINOTECAN HCL CHEMO INJECTION 100 MG/5ML
180.0000 mg/m2 | Freq: Once | INTRAVENOUS | Status: AC
  Administered 2022-02-07: 460 mg via INTRAVENOUS
  Filled 2022-02-07: qty 15

## 2022-02-07 MED ORDER — SODIUM CHLORIDE 0.9 % IV SOLN: Freq: Once | INTRAVENOUS | Status: AC

## 2022-02-07 MED ORDER — LEUCOVORIN CALCIUM INJECTION 350 MG
397.0000 mg/m2 | Freq: Once | INTRAVENOUS | Status: AC
  Administered 2022-02-07: 1020 mg via INTRAVENOUS
  Filled 2022-02-07: qty 51

## 2022-02-07 MED ORDER — PALONOSETRON HCL INJECTION 0.25 MG/5ML
0.2500 mg | Freq: Once | INTRAVENOUS | Status: AC
  Administered 2022-02-07: 0.25 mg via INTRAVENOUS
  Filled 2022-02-07: qty 5

## 2022-02-07 MED ORDER — SODIUM CHLORIDE 0.9 % IV SOLN
2375.0000 mg/m2 | INTRAVENOUS | Status: DC
  Administered 2022-02-07: 6100 mg via INTRAVENOUS
  Filled 2022-02-07: qty 122

## 2022-02-07 MED ORDER — ATROPINE SULFATE 1 MG/ML IV SOLN
0.5000 mg | Freq: Once | INTRAVENOUS | Status: AC | PRN
  Administered 2022-02-07: 0.5 mg via INTRAVENOUS
  Filled 2022-02-07: qty 1

## 2022-02-07 MED ORDER — FLUOROURACIL CHEMO INJECTION 2.5 GM/50ML
398.0000 mg/m2 | Freq: Once | INTRAVENOUS | Status: AC
  Administered 2022-02-07: 1000 mg via INTRAVENOUS
  Filled 2022-02-07: qty 20

## 2022-02-07 NOTE — Patient Instructions (Signed)
Fluorouracil Injection What is this medication? FLUOROURACIL (flure oh YOOR a sil) treats some types of cancer. It works by slowing down the growth of cancer cells. This medicine may be used for other purposes; ask your health care provider or pharmacist if you have questions. COMMON BRAND NAME(S): Adrucil What should I tell my care team before I take this medication? They need to know if you have any of these conditions: Blood disorders Dihydropyrimidine dehydrogenase (DPD) deficiency Infection, such as chickenpox, cold sores, herpes Kidney disease Liver disease Poor nutrition Recent or ongoing radiation therapy An unusual or allergic reaction to fluorouracil, other medications, foods, dyes, or preservatives If you or your partner are pregnant or trying to get pregnant Breast-feeding How should I use this medication? This medication is injected into a vein. It is administered by your care team in a hospital or clinic setting. Talk to your care team about the use of this medication in children. Special care may be needed. Overdosage: If you think you have taken too much of this medicine contact a poison control center or emergency room at once. NOTE: This medicine is only for you. Do not share this medicine with others. What if I miss a dose? Keep appointments for follow-up doses. It is important not to miss your dose. Call your care team if you are unable to keep an appointment. What may interact with this medication? Do not take this medication with any of the following: Live virus vaccines This medication may also interact with the following: Medications that treat or prevent blood clots, such as warfarin, enoxaparin, dalteparin This list may not describe all possible interactions. Give your health care provider a list of all the medicines, herbs, non-prescription drugs, or dietary supplements you use. Also tell them if you smoke, drink alcohol, or use illegal drugs. Some items may  interact with your medicine. What should I watch for while using this medication? Your condition will be monitored carefully while you are receiving this medication. This medication may make you feel generally unwell. This is not uncommon as chemotherapy can affect healthy cells as well as cancer cells. Report any side effects. Continue your course of treatment even though you feel ill unless your care team tells you to stop. In some cases, you may be given additional medications to help with side effects. Follow all directions for their use. This medication may increase your risk of getting an infection. Call your care team for advice if you get a fever, chills, sore throat, or other symptoms of a cold or flu. Do not treat yourself. Try to avoid being around people who are sick. This medication may increase your risk to bruise or bleed. Call your care team if you notice any unusual bleeding. Be careful brushing or flossing your teeth or using a toothpick because you may get an infection or bleed more easily. If you have any dental work done, tell your dentist you are receiving this medication. Avoid taking medications that contain aspirin, acetaminophen, ibuprofen, naproxen, or ketoprofen unless instructed by your care team. These medications may hide a fever. Do not treat diarrhea with over the counter products. Contact your care team if you have diarrhea that lasts more than 2 days or if it is severe and watery. This medication can make you more sensitive to the sun. Keep out of the sun. If you cannot avoid being in the sun, wear protective clothing and sunscreen. Do not use sun lamps, tanning beds, or tanning booths. Talk to   your care team if you or your partner wish to become pregnant or think you might be pregnant. This medication can cause serious birth defects if taken during pregnancy and for 3 months after the last dose. A reliable form of contraception is recommended while taking this  medication and for 3 months after the last dose. Talk to your care team about effective forms of contraception. Do not father a child while taking this medication and for 3 months after the last dose. Use a condom while having sex during this time period. Do not breastfeed while taking this medication. This medication may cause infertility. Talk to your care team if you are concerned about your fertility. What side effects may I notice from receiving this medication? Side effects that you should report to your care team as soon as possible: Allergic reactions--skin rash, itching, hives, swelling of the face, lips, tongue, or throat Heart attack--pain or tightness in the chest, shoulders, arms, or jaw, nausea, shortness of breath, cold or clammy skin, feeling faint or lightheaded Heart failure--shortness of breath, swelling of the ankles, feet, or hands, sudden weight gain, unusual weakness or fatigue Heart rhythm changes--fast or irregular heartbeat, dizziness, feeling faint or lightheaded, chest pain, trouble breathing High ammonia level--unusual weakness or fatigue, confusion, loss of appetite, nausea, vomiting, seizures Infection--fever, chills, cough, sore throat, wounds that don't heal, pain or trouble when passing urine, general feeling of discomfort or being unwell Low red blood cell level--unusual weakness or fatigue, dizziness, headache, trouble breathing Pain, tingling, or numbness in the hands or feet, muscle weakness, change in vision, confusion or trouble speaking, loss of balance or coordination, trouble walking, seizures Redness, swelling, and blistering of the skin over hands and feet Severe or prolonged diarrhea Unusual bruising or bleeding Side effects that usually do not require medical attention (report to your care team if they continue or are bothersome): Dry skin Headache Increased tears Nausea Pain, redness, or swelling with sores inside the mouth or throat Sensitivity  to light Vomiting This list may not describe all possible side effects. Call your doctor for medical advice about side effects. You may report side effects to FDA at 1-800-FDA-1088. Where should I keep my medication? This medication is given in a hospital or clinic. It will not be stored at home. NOTE: This sheet is a summary. It may not cover all possible information. If you have questions about this medicine, talk to your doctor, pharmacist, or health care provider.  2023 [employer] Standard (2021-08-30 00:00:00) Leucovorin Injection What is this medication? LEUCOVORIN (loo koe VOR in) prevents side effects from certain medications, such as methotrexate. It works by increasing folate levels. This helps protect healthy cells in your body. It may also be used to treat anemia caused by low levels of folate. It can also be used with fluorouracil, a type of chemotherapy, to treat colorectal cancer. It works by increasing the effects of fluorouracil in the body. This medicine may be used for other purposes; ask your health care provider or pharmacist if you have questions. What should I tell my care team before I take this medication? They need to know if you have any of these conditions: Anemia from low levels of vitamin B12 in the blood An unusual or allergic reaction to leucovorin, folic acid, other medications, foods, dyes, or preservatives Pregnant or trying to get pregnant Breastfeeding How should I use this medication? This medication is injected into a vein or a muscle. It is given by your care team  in a hospital or clinic setting. Talk to your care team about the use of this medication in children. Special care may be needed. Overdosage: If you think you have taken too much of this medicine contact a poison control center or emergency room at once. NOTE: This medicine is only for you. Do not share this medicine with others. What if I miss a dose? Keep appointments for follow-up doses.  It is important not to miss your dose. Call your care team if you are unable to keep an appointment. What may interact with this medication? Capecitabine Fluorouracil Phenobarbital Phenytoin Primidone Trimethoprim;sulfamethoxazole This list may not describe all possible interactions. Give your health care provider a list of all the medicines, herbs, non-prescription drugs, or dietary supplements you use. Also tell them if you smoke, drink alcohol, or use illegal drugs. Some items may interact with your medicine. What should I watch for while using this medication? Your condition will be monitored carefully while you are receiving this medication. This medication may increase the side effects of 5-fluorouracil. Tell your care team if you have diarrhea or mouth sores that do not get better or that get worse. What side effects may I notice from receiving this medication? Side effects that you should report to your care team as soon as possible: Allergic reactions--skin rash, itching, hives, swelling of the face, lips, tongue, or throat This list may not describe all possible side effects. Call your doctor for medical advice about side effects. You may report side effects to FDA at 1-800-FDA-1088. Where should I keep my medication? This medication is given in a hospital or clinic. It will not be stored at home. NOTE: This sheet is a summary. It may not cover all possible information. If you have questions about this medicine, talk to your doctor, pharmacist, or health care provider.  2023 [employer] Standard (2021-09-02 00:00:00) Irinotecan Injection What is this medication? IRINOTECAN (ir in oh TEE kan) treats some types of cancer. It works by slowing down the growth of cancer cells. This medicine may be used for other purposes; ask your health care provider or pharmacist if you have questions. COMMON BRAND NAME(S): Camptosar What should I tell my care team before I take this  medication? They need to know if you have any of these conditions: Dehydration Diarrhea Infection, especially a viral infection, such as chickenpox, cold sores, herpes Liver disease Low blood cell levels (white cells, red cells, and platelets) Low levels of electrolytes, such as calcium, magnesium, or potassium in your blood Recent or ongoing radiation An unusual or allergic reaction to irinotecan, other medications, foods, dyes, or preservatives If you or your partner are pregnant or trying to get pregnant Breast-feeding How should I use this medication? This medication is injected into a vein. It is given by your care team in a hospital or clinic setting. Talk to your care team about the use of this medication in children. Special care may be needed. Overdosage: If you think you have taken too much of this medicine contact a poison control center or emergency room at once. NOTE: This medicine is only for you. Do not share this medicine with others. What if I miss a dose? Keep appointments for follow-up doses. It is important not to miss your dose. Call your care team if you are unable to keep an appointment. What may interact with this medication? Do not take this medication with any of the following: Cobicistat Itraconazole This medication may also interact with the following:  Certain antibiotics, such as clarithromycin, rifampin, rifabutin Certain antivirals for HIV or AIDS Certain medications for fungal infections, such as ketoconazole, posaconazole, voriconazole Certain medications for seizures, such as carbamazepine, phenobarbital, phenytoin Gemfibrozil Nefazodone St. John's wort This list may not describe all possible interactions. Give your health care provider a list of all the medicines, herbs, non-prescription drugs, or dietary supplements you use. Also tell them if you smoke, drink alcohol, or use illegal drugs. Some items may interact with your medicine. What should I  watch for while using this medication? Your condition will be monitored carefully while you are receiving this medication. You may need blood work while taking this medication. This medication may make you feel generally unwell. This is not uncommon as chemotherapy can affect healthy cells as well as cancer cells. Report any side effects. Continue your course of treatment even though you feel ill unless your care team tells you to stop. This medication can cause serious side effects. To reduce the risk, your care team may give you other medications to take before receiving this one. Be sure to follow the directions from your care team. This medication may affect your coordination, reaction time, or judgement. Do not drive or operate machinery until you know how this medication affects you. Sit up or stand slowly to reduce the risk of dizzy or fainting spells. Drinking alcohol with this medication can increase the risk of these side effects. This medication may increase your risk of getting an infection. Call your care team for advice if you get a fever, chills, sore throat, or other symptoms of a cold or flu. Do not treat yourself. Try to avoid being around people who are sick. Avoid taking medications that contain aspirin, acetaminophen, ibuprofen, naproxen, or ketoprofen unless instructed by your care team. These medications may hide a fever. This medication may increase your risk to bruise or bleed. Call your care team if you notice any unusual bleeding. Be careful brushing or flossing your teeth or using a toothpick because you may get an infection or bleed more easily. If you have any dental work done, tell your dentist you are receiving this medication. Talk to your care team if you or your partner are pregnant or think either of you might be pregnant. This medication can cause serious birth defects if taken during pregnancy and for 6 months after the last dose. You will need a negative pregnancy test  before starting this medication. Contraception is recommended while taking this medication and for 6 months after the last dose. Your care team can help you find the option that works for you. Do not father a child while taking this medication and for 3 months after the last dose. Use a condom for contraception during this time period. Do not breastfeed while taking this medication and for 7 days after the last dose. This medication may cause infertility. Talk to your care team if you are concerned about your fertility. What side effects may I notice from receiving this medication? Side effects that you should report to your care team as soon as possible: Allergic reactions--skin rash, itching, hives, swelling of the face, lips, tongue, or throat Dry cough, shortness of breath or trouble breathing Increased saliva or tears, increased sweating, stomach cramping, diarrhea, small pupils, unusual weakness or fatigue, slow heartbeat Infection--fever, chills, cough, sore throat, wounds that don't heal, pain or trouble when passing urine, general feeling of discomfort or being unwell Kidney injury--decrease in the amount of urine, swelling of the  ankles, hands, or feet Low red blood cell level--unusual weakness or fatigue, dizziness, headache, trouble breathing Severe or prolonged diarrhea Unusual bruising or bleeding Side effects that usually do not require medical attention (report to your care team if they continue or are bothersome): Constipation Diarrhea Hair loss Loss of appetite Nausea Stomach pain This list may not describe all possible side effects. Call your doctor for medical advice about side effects. You may report side effects to FDA at 1-800-FDA-1088. Where should I keep my medication? This medication is given in a hospital or clinic. It will not be stored at home. NOTE: This sheet is a summary. It may not cover all possible information. If you have questions about this medicine, talk  to your doctor, pharmacist, or health care provider.  2023 [employer]/Gold Standard (2021-09-01 00:00:00) Bevacizumab Injection What is this medication? BEVACIZUMAB (be va SIZ yoo mab) treats some types of cancer. It works by blocking a protein that causes cancer cells to grow and multiply. This helps to slow or stop the spread of cancer cells. It is a monoclonal antibody. This medicine may be used for other purposes; ask your health care provider or pharmacist if you have questions. COMMON BRAND NAME(S): Alymsys, Avastin, MVASI, Noah Charon What should I tell my care team before I take this medication? They need to know if you have any of these conditions: Blood clots Coughing up blood Having or recent surgery Heart failure High blood pressure History of a connection between 2 or more body parts that do not usually connect (fistula) History of a tear in your stomach or intestines Protein in your urine An unusual or allergic reaction to bevacizumab, other medications, foods, dyes, or preservatives Pregnant or trying to get pregnant Breast-feeding How should I use this medication? This medication is injected into a vein. It is given by your care team in a hospital or clinic setting. Talk to your care team the use of this medication in children. Special care may be needed. Overdosage: If you think you have taken too much of this medicine contact a poison control center or emergency room at once. NOTE: This medicine is only for you. Do not share this medicine with others. What if I miss a dose? Keep appointments for follow-up doses. It is important not to miss your dose. Call your care team if you are unable to keep an appointment. What may interact with this medication? Interactions are not expected. This list may not describe all possible interactions. Give your health care provider a list of all the medicines, herbs, non-prescription drugs, or dietary supplements you use. Also tell them if  you smoke, drink alcohol, or use illegal drugs. Some items may interact with your medicine. What should I watch for while using this medication? Your condition will be monitored carefully while you are receiving this medication. You may need blood work while taking this medication. This medication may make you feel generally unwell. This is not uncommon as chemotherapy can affect healthy cells as well as cancer cells. Report any side effects. Continue your course of treatment even though you feel ill unless your care team tells you to stop. This medication may increase your risk to bruise or bleed. Call your care team if you notice any unusual bleeding. Before having surgery, talk to your care team to make sure it is ok. This medication can increase the risk of poor healing of your surgical site or wound. You will need to stop this medication for 28 days before  surgery. After surgery, wait at least 28 days before restarting this medication. Make sure the surgical site or wound is healed enough before restarting this medication. Talk to your care team if questions. Talk to your care team if you may be pregnant. Serious birth defects can occur if you take this medication during pregnancy and for 6 months after the last dose. Contraception is recommended while taking this medication and for 6 months after the last dose. Your care team can help you find the option that works for you. Do not breastfeed while taking this medication and for 6 months after the last dose. This medication can cause infertility. Talk to your care team if you are concerned about your fertility. What side effects may I notice from receiving this medication? Side effects that you should report to your care team as soon as possible: Allergic reactions--skin rash, itching, hives, swelling of the face, lips, tongue, or throat Bleeding--bloody or black, tar-like stools, vomiting blood or brown material that looks like coffee grounds, red  or dark brown urine, small red or purple spots on skin, unusual bruising or bleeding Blood clot--pain, swelling, or warmth in the leg, shortness of breath, chest pain Heart attack--pain or tightness in the chest, shoulders, arms, or jaw, nausea, shortness of breath, cold or clammy skin, feeling faint or lightheaded Heart failure--shortness of breath, swelling of the ankles, feet, or hands, sudden weight gain, unusual weakness or fatigue Increase in blood pressure Infection--fever, chills, cough, sore throat, wounds that don't heal, pain or trouble when passing urine, general feeling of discomfort or being unwell Infusion reactions--chest pain, shortness of breath or trouble breathing, feeling faint or lightheaded Kidney injury--decrease in the amount of urine, swelling of the ankles, hands, or feet Stomach pain that is severe, does not go away, or gets worse Stroke--sudden numbness or weakness of the face, arm, or leg, trouble speaking, confusion, trouble walking, loss of balance or coordination, dizziness, severe headache, change in vision Sudden and severe headache, confusion, change in vision, seizures, which may be signs of posterior reversible encephalopathy syndrome (PRES) Side effects that usually do not require medical attention (report to your care team if they continue or are bothersome): Back pain Change in taste Diarrhea Dry skin Increased tears Nosebleed This list may not describe all possible side effects. Call your doctor for medical advice about side effects. You may report side effects to FDA at 1-800-FDA-1088. Where should I keep my medication? This medication is given in a hospital or clinic. It will not be stored at home. NOTE: This sheet is a summary. It may not cover all possible information. If you have questions about this medicine, talk to your doctor, pharmacist, or health care provider.  2023 [employer] Standard (2021-09-06 00:00:00)

## 2022-02-09 ENCOUNTER — Inpatient Hospital Stay: Payer: BC Managed Care – PPO

## 2022-02-09 VITALS — BP 134/94 | HR 99 | Temp 98.1°F | Resp 18 | Ht 78.0 in | Wt 271.1 lb

## 2022-02-09 DIAGNOSIS — Z87891 Personal history of nicotine dependence: Secondary | ICD-10-CM | POA: Diagnosis not present

## 2022-02-09 DIAGNOSIS — Z5111 Encounter for antineoplastic chemotherapy: Secondary | ICD-10-CM | POA: Diagnosis not present

## 2022-02-09 DIAGNOSIS — Z95828 Presence of other vascular implants and grafts: Secondary | ICD-10-CM

## 2022-02-09 DIAGNOSIS — D500 Iron deficiency anemia secondary to blood loss (chronic): Secondary | ICD-10-CM | POA: Diagnosis not present

## 2022-02-09 DIAGNOSIS — Z79899 Other long term (current) drug therapy: Secondary | ICD-10-CM | POA: Diagnosis not present

## 2022-02-09 DIAGNOSIS — R21 Rash and other nonspecific skin eruption: Secondary | ICD-10-CM | POA: Diagnosis not present

## 2022-02-09 DIAGNOSIS — Z5112 Encounter for antineoplastic immunotherapy: Secondary | ICD-10-CM | POA: Diagnosis not present

## 2022-02-09 DIAGNOSIS — C787 Secondary malignant neoplasm of liver and intrahepatic bile duct: Secondary | ICD-10-CM

## 2022-02-09 DIAGNOSIS — C187 Malignant neoplasm of sigmoid colon: Secondary | ICD-10-CM | POA: Diagnosis not present

## 2022-02-09 DIAGNOSIS — Z23 Encounter for immunization: Secondary | ICD-10-CM | POA: Diagnosis not present

## 2022-02-09 MED ORDER — HEPARIN SOD (PORK) LOCK FLUSH 100 UNIT/ML IV SOLN
500.0000 [IU] | Freq: Once | INTRAVENOUS | Status: AC | PRN
  Administered 2022-02-09: 500 [IU]

## 2022-02-09 MED ORDER — SODIUM CHLORIDE 0.9% FLUSH
10.0000 mL | INTRAVENOUS | Status: DC | PRN
  Administered 2022-02-09: 10 mL

## 2022-02-09 NOTE — Patient Instructions (Signed)
Fluorouracil Injection What is this medication? FLUOROURACIL (flure oh YOOR a sil) treats some types of cancer. It works by slowing down the growth of cancer cells. This medicine may be used for other purposes; ask your health care provider or pharmacist if you have questions. COMMON BRAND NAME(S): Adrucil What should I tell my care team before I take this medication? They need to know if you have any of these conditions: Blood disorders Dihydropyrimidine dehydrogenase (DPD) deficiency Infection, such as chickenpox, cold sores, herpes Kidney disease Liver disease Poor nutrition Recent or ongoing radiation therapy An unusual or allergic reaction to fluorouracil, other medications, foods, dyes, or preservatives If you or your partner are pregnant or trying to get pregnant Breast-feeding How should I use this medication? This medication is injected into a vein. It is administered by your care team in a hospital or clinic setting. Talk to your care team about the use of this medication in children. Special care may be needed. Overdosage: If you think you have taken too much of this medicine contact a poison control center or emergency room at once. NOTE: This medicine is only for you. Do not share this medicine with others. What if I miss a dose? Keep appointments for follow-up doses. It is important not to miss your dose. Call your care team if you are unable to keep an appointment. What may interact with this medication? Do not take this medication with any of the following: Live virus vaccines This medication may also interact with the following: Medications that treat or prevent blood clots, such as warfarin, enoxaparin, dalteparin This list may not describe all possible interactions. Give your health care provider a list of all the medicines, herbs, non-prescription drugs, or dietary supplements you use. Also tell them if you smoke, drink alcohol, or use illegal drugs. Some items may  interact with your medicine. What should I watch for while using this medication? Your condition will be monitored carefully while you are receiving this medication. This medication may make you feel generally unwell. This is not uncommon as chemotherapy can affect healthy cells as well as cancer cells. Report any side effects. Continue your course of treatment even though you feel ill unless your care team tells you to stop. In some cases, you may be given additional medications to help with side effects. Follow all directions for their use. This medication may increase your risk of getting an infection. Call your care team for advice if you get a fever, chills, sore throat, or other symptoms of a cold or flu. Do not treat yourself. Try to avoid being around people who are sick. This medication may increase your risk to bruise or bleed. Call your care team if you notice any unusual bleeding. Be careful brushing or flossing your teeth or using a toothpick because you may get an infection or bleed more easily. If you have any dental work done, tell your dentist you are receiving this medication. Avoid taking medications that contain aspirin, acetaminophen, ibuprofen, naproxen, or ketoprofen unless instructed by your care team. These medications may hide a fever. Do not treat diarrhea with over the counter products. Contact your care team if you have diarrhea that lasts more than 2 days or if it is severe and watery. This medication can make you more sensitive to the sun. Keep out of the sun. If you cannot avoid being in the sun, wear protective clothing and sunscreen. Do not use sun lamps, tanning beds, or tanning booths. Talk to   your care team if you or your partner wish to become pregnant or think you might be pregnant. This medication can cause serious birth defects if taken during pregnancy and for 3 months after the last dose. A reliable form of contraception is recommended while taking this  medication and for 3 months after the last dose. Talk to your care team about effective forms of contraception. Do not father a child while taking this medication and for 3 months after the last dose. Use a condom while having sex during this time period. Do not breastfeed while taking this medication. This medication may cause infertility. Talk to your care team if you are concerned about your fertility. What side effects may I notice from receiving this medication? Side effects that you should report to your care team as soon as possible: Allergic reactions--skin rash, itching, hives, swelling of the face, lips, tongue, or throat Heart attack--pain or tightness in the chest, shoulders, arms, or jaw, nausea, shortness of breath, cold or clammy skin, feeling faint or lightheaded Heart failure--shortness of breath, swelling of the ankles, feet, or hands, sudden weight gain, unusual weakness or fatigue Heart rhythm changes--fast or irregular heartbeat, dizziness, feeling faint or lightheaded, chest pain, trouble breathing High ammonia level--unusual weakness or fatigue, confusion, loss of appetite, nausea, vomiting, seizures Infection--fever, chills, cough, sore throat, wounds that don't heal, pain or trouble when passing urine, general feeling of discomfort or being unwell Low red blood cell level--unusual weakness or fatigue, dizziness, headache, trouble breathing Pain, tingling, or numbness in the hands or feet, muscle weakness, change in vision, confusion or trouble speaking, loss of balance or coordination, trouble walking, seizures Redness, swelling, and blistering of the skin over hands and feet Severe or prolonged diarrhea Unusual bruising or bleeding Side effects that usually do not require medical attention (report to your care team if they continue or are bothersome): Dry skin Headache Increased tears Nausea Pain, redness, or swelling with sores inside the mouth or throat Sensitivity  to light Vomiting This list may not describe all possible side effects. Call your doctor for medical advice about side effects. You may report side effects to FDA at 1-800-FDA-1088. Where should I keep my medication? This medication is given in a hospital or clinic. It will not be stored at home. NOTE: This sheet is a summary. It may not cover all possible information. If you have questions about this medicine, talk to your doctor, pharmacist, or health care provider.  2023 Elsevier/Gold Standard (2021-08-30 00:00:00)  

## 2022-02-12 DIAGNOSIS — C189 Malignant neoplasm of colon, unspecified: Secondary | ICD-10-CM | POA: Diagnosis not present

## 2022-02-17 ENCOUNTER — Inpatient Hospital Stay (HOSPITAL_BASED_OUTPATIENT_CLINIC_OR_DEPARTMENT_OTHER): Payer: BC Managed Care – PPO | Admitting: Oncology

## 2022-02-17 ENCOUNTER — Encounter: Payer: Self-pay | Admitting: Oncology

## 2022-02-17 ENCOUNTER — Inpatient Hospital Stay: Payer: BC Managed Care – PPO

## 2022-02-17 ENCOUNTER — Other Ambulatory Visit: Payer: Self-pay | Admitting: Oncology

## 2022-02-17 VITALS — BP 147/100 | HR 91 | Temp 98.0°F | Resp 18 | Ht 78.0 in | Wt 269.6 lb

## 2022-02-17 DIAGNOSIS — Z87891 Personal history of nicotine dependence: Secondary | ICD-10-CM | POA: Diagnosis not present

## 2022-02-17 DIAGNOSIS — C187 Malignant neoplasm of sigmoid colon: Secondary | ICD-10-CM

## 2022-02-17 DIAGNOSIS — C189 Malignant neoplasm of colon, unspecified: Secondary | ICD-10-CM | POA: Diagnosis not present

## 2022-02-17 DIAGNOSIS — Z5112 Encounter for antineoplastic immunotherapy: Secondary | ICD-10-CM | POA: Diagnosis not present

## 2022-02-17 DIAGNOSIS — C787 Secondary malignant neoplasm of liver and intrahepatic bile duct: Secondary | ICD-10-CM | POA: Diagnosis not present

## 2022-02-17 DIAGNOSIS — R21 Rash and other nonspecific skin eruption: Secondary | ICD-10-CM | POA: Diagnosis not present

## 2022-02-17 DIAGNOSIS — D500 Iron deficiency anemia secondary to blood loss (chronic): Secondary | ICD-10-CM | POA: Diagnosis not present

## 2022-02-17 DIAGNOSIS — Z79899 Other long term (current) drug therapy: Secondary | ICD-10-CM | POA: Diagnosis not present

## 2022-02-17 DIAGNOSIS — Z23 Encounter for immunization: Secondary | ICD-10-CM | POA: Diagnosis not present

## 2022-02-17 DIAGNOSIS — Z5111 Encounter for antineoplastic chemotherapy: Secondary | ICD-10-CM | POA: Diagnosis not present

## 2022-02-17 LAB — CBC AND DIFFERENTIAL
HCT: 38 — AB (ref 41–53)
Hemoglobin: 12.6 — AB (ref 13.5–17.5)
Neutrophils Absolute: 2.78
Platelets: 316 10*3/uL (ref 150–400)
WBC: 4.8

## 2022-02-17 LAB — COMPREHENSIVE METABOLIC PANEL
Albumin: 3.9 (ref 3.5–5.0)
Calcium: 9.2 (ref 8.7–10.7)

## 2022-02-17 LAB — BASIC METABOLIC PANEL
BUN: 15 (ref 4–21)
CO2: 26 — AB (ref 13–22)
Chloride: 105 (ref 99–108)
Creatinine: 1 (ref 0.6–1.3)
Glucose: 123
Potassium: 4 mEq/L (ref 3.5–5.1)
Sodium: 140 (ref 137–147)

## 2022-02-17 LAB — HEPATIC FUNCTION PANEL
ALT: 23 U/L (ref 10–40)
AST: 33 (ref 14–40)
Alkaline Phosphatase: 86 (ref 25–125)
Bilirubin, Total: 0.5

## 2022-02-17 LAB — CBC: RBC: 4.95 (ref 3.87–5.11)

## 2022-02-17 LAB — TOTAL PROTEIN, URINE DIPSTICK: Protein, ur: NEGATIVE mg/dL

## 2022-02-17 NOTE — Progress Notes (Signed)
Eduardo Hester  [address] [place],  [place]  [address] [phone]  Clinic Day:  02/17/22  Referring physician: Angelina Sheriff, MD  ASSESSMENT & PLAN:   Assessment & Plan: Malignant neoplasm metastatic to liver [facility]) History of liver metastasis in January 2021 treated with resection alone.  He had recurrent liver metastasis treated with a partial hepatectomy in June.  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  There was 1 negative node but margins were close.  This was attached to the diaphragm but the tissue from the diaphragm is benign.  He had a complicated postoperative course developing an cyst requiring drainage.  He then developed a right pleural effusion in July treated with a VATS procedure.  He is receiving chemotherapy with FOLFIRI and Avastin, which he started in August.  His third cycle of chemotherapy was delayed as he was admitted with sepsis.  He completed IV antibiotics as an outpatient.  He was found to have a another solitary metastasis to the liveron MRI scan in May 2023.  He was returned referred to Dr. Rolla Etienne and underwent surgical resection on June 13th.  He did have postoperative complications including biliary leak and had ERCP with placement of an endoscopic stent into the biliary duct.  The was readmitted on June 27 with abscess and perihepatic fluid collection that required placement of a drain.  He had fevers and has felt very poorly since that time.  He was then found to have pleural effusion on July 1 treated with a right thoracoscopic total pulmonary decortication.  We recommended postoperative chemotherapy with FOLFIRI/bevacizumab.   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  He was treated with surgical resection followed by 6 months of adjuvant chemotherapy with FOLFOX. He had ascites and peritoneal nodules in 2018  with negative biopsy and negative fluid cytology.  He was taken for exploratory laparotomy with removal of the nodule which is found to be benign.  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  We discussed postoperative chemotherapy, but ultimately decided on surveillance.     Iron deficiency anemia due to chronic blood loss Microcytic anemia, which is stable.  He denies any overt form of blood loss.  Iron studies were equivocal in July.  He was placed on ferrous sulfate daily with improvement in his hemoglobin.  We will continue ferrous sulfate daily for at least another 6 weeks.    We will continue his FOLFOX and Avastin, which she will receive next week.  He will return in 2 weeks with CBC and CMP for his next cycle.  We will have to change his appointment dates for the end of November due to the Thanksgiving holiday. The patient understands the plans discussed today and is in agreement with them.  He knows to contact our office if he develops concerns prior to his next appointment.    I provided 15 minutes of face-to-face time during this encounter and > 50% was spent counseling as documented under my assessment and plan.    Derwood Kaplan, MD  [facility] [address] [place] [address] Dept: [phone] Dept Fax: [phone]   No orders of the defined types were placed in this encounter.     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  He presented with fevers and chills in September 2017 and was found to have diverticulitis, which was treated.  CT at that time revealed a 1.7 cm lesion in the liver, which was nonspecific, as well as ectopic left kidney with a cyst.  He was then brought  back for a colonoscopy, which revealed a 3.5 cm mass in the sigmoid.  Biopsy revealed high-grade dysplasia.  CT abdomen and pelvis revealed an indeterminate lesion in the liver in addition to wall thickening in the sigmoid colon.  MRI abdomen revealed the lesion in the liver to represent complex cyst versus vascular lesion, however, follow-up in 6 months was recommended.  Air contrast barium enema revealed an apple-core lesion of the sigmoid colon.  Baseline CEA was 0.6.  He underwent surgical resection in November 2017.  Pathology revealed a 4 cm, grade 2, adenocarcinoma of the sigmoid colon, as well as chronic active diverticulitis with an area of perforation, however, the tumor was not perforated.  Tumor invaded through the muscularis propria into the pericolorectal tissues.  4/21 nodes were positive for metastasis.  Margins were clear.  MMR and MSI were normal.  KRAS and BRAF mutations were negative.  He had iron deficiency treated with oral iron supplement in the form of Hemocyte daily.  Due to his age of diagnosis and family history he underwent testing for hereditary non polyposis colorectal cancer with the Myriad myRisk Hereditary Cancer Panel test.  This did not reveal any clinically significant mutation or variants of uncertain significance.  Repeat MRI abdomen in January 2018 revealed a stable lesion within the right lobe of the liver, most consistent with benign lesion.  There was new small volume ascites seen.  The patient received adjuvant chemotherapy with FOLFOX  for 12 cycles, which was completed in June.  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  He was placed on gabapentin in July, then noticed abdominal bloating, which he attributed to the gabapentin, so he discontinued that after 1 month.  He was seen for routine follow-up in September 2018 with a repeat MRI abdomen to re-evaluate the liver lesion.  That revealed a large volume  ascites with findings suspicious for peritoneal disease.  The lesion in the right lobe of the liver was stable and still assistant with a benign lesion.  The patient underwent paracentesis with removal of 4.7 L of fluid, but fluid cytology was negative for malignancy.  PET scan in early October did not reveal any hypermetabolic activity, but moderate ascites was seen.  Ultrasound-guided peritoneal biopsy was negative for malignancy, revealing adipose tissue with fibrosis, patchy inflammation and minimal atypia.  His case was discussed at tumor conference and he was referred to Dr. Noberto Retort for consideration open biopsy.  Dr. Noberto Retort performed colonoscopy in October 2018, which was negative.  Follow-up colonoscopy in 3 years was recommended.  The patient then underwent exploratory laparotomy with multiple peritoneal biopsies.  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  We therefore recommended observation for the patient.  In November 2018, he was seen in the emergency room with abdominal pain, nausea and vomiting.  Gallbladder ultrasound revealed cholelithiasis with sludge and polyps, as well as mild gallbladder wall thickening.  Mild ascites was  also seen.  CT abdomen and pelvis revealed mild diffuse gallbladder wall edema otherwise unchanged from PET-CT done in October.  He underwent cholecystectomy with findings of acute cholecystitis.  Pathology did not reveal any evidence of malignancy.    He was seen off schedule in April 2019, as he presented to Dr. Janace Aris office with abdominal pain and was found to have an elevated bilirubin and liver transaminases.  Due to the laboratory abnormalities, he underwent repeat MRI abdomen at that time, which revealed a stable lesion in the right hepatic lobe, which was felt to be indeterminate, with a 2nd smaller indeterminate lesion in the right  hepatic lobe measuring 12 mm, which was new from previous imaging.  No hepatic steatosis or ascites was seen.  CEA was normal.  Hepatitis panel and CMV were negative  we felt the laboratory abnormalities were most likely secondary to viral illness.  He was followed closely and underwent a repeat MRI abdomen in June, which remained stable. He had one again in December of 2019, which was stable.  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  MRI abdomen in January 2021 revealed the mass in the junction of segments 7 and 8 in the liver has increased in size compared to the prior exam, currently 2.9 x 2.1 x 2.5 cm.  The lesion has a branching component, some of which may be from portal vein thrombus or tumor thrombus, even this branching component appears to enhance on subtraction images.   Biopsy in February revealed adenocarcinoma with necrosis, consistent with metastasis from colon primary.  He underwent surgical resection in February with Dr. Rolla Etienne at [facility].  We discussed the option of further chemotherapy, including the risks and benefits, and he opted for surveillance.  MRI abdomen in May 2023 revealed another solitary metastasis in the liver.  Treated with partial hepatectomy in June with Dr. Cecil Cobbs.  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  There was 1 negative node, but margins were close.  This was attached to the diaphragm, but the tissue from the diaphragm was benign.  He had a complicated postoperative course developing an cyst requiring drainage.  He then developed a right pleural effusion in July treated with a VATS procedure.  He is receiving chemotherapy with FOLFIRI and Avastin, which he  started in August   Oncology History  Colon cancer [facility])  01/23/2016 Cancer Staging   Staging form: Colon and Rectum, AJCC 7th Edition - Clinical stage from 01/23/2016: Stage IIIB (T3, N2a, M0) - Signed by Derwood Kaplan, MD on 05/03/2020 Staging comments: 3.5 cm MMR, MSI normal, No mutation of KRAS or BRAF, neg.genetics Rec'd 6 months adjuvant FOLFOX chemotherapy   02/02/2016 Initial Diagnosis   Colon cancer ([place])   12/13/2021 - 12/29/2021 Chemotherapy   Patient is on Treatment Plan : COLORECTAL FOLFIRI / BEVACIZUMAB Q14D     12/13/2021 -  Chemotherapy   Patient is on Treatment Plan : COLORECTAL FOLFIRI + Bevacizumab q14d     Malignant neoplasm metastatic to liver ([facility])  05/29/2019 Initial Diagnosis   Malignant neoplasm metastatic to liver ([facility])   12/13/2021 - 12/29/2021 Chemotherapy   Patient is on Treatment Plan : COLORECTAL FOLFIRI / BEVACIZUMAB Q14D  12/13/2021 -  Chemotherapy   Patient is on Treatment Plan : COLORECTAL FOLFIRI + Bevacizumab q14d     01/07/2022 Imaging   CT abdomen and pelvis:  IMPRESSION:  1. Interval right hepatectomy with surgical changes in the dome of  the left liver.  2. 12 mm short axis lymph node identified in the region of the porta  hepatis. Attention on follow-up recommended.  3. Left pelvic kidney with large simple appearing cyst measuring  12.5 cm, increased from 7.8 cm on 03/14/2017.  4. No acute findings in the abdomen or pelvis. Specifically, no  findings to explain the patient's history of abdominal pain and  fever.        INTERVAL HISTORY:  Eduardo Hester is here today for repeat clinical assessment prior to a fifth cycle of FOLFIRI/bevacizumab.   He continues to tolerate chemotherapy without significant difficulty.  He does have difficulty sleeping on day 1 of chemotherapy despite Ativan 1 mg at bedtime, so I told him he could take 2 if needed.  He denies diarrhea.  He denies bleeding from the hemorrhoid.  He denies melena.  He reports stable  neuropathy of the hands and feet. He denies fevers, chills or other symptoms of infection. He denies pain. His appetite is good. His weight has been stable.  He continues ferrous sulfate daily.  His hemoglobin has come up from 12.1 to 12.6 with an MCV of 78 and so he needs to continue his iron supplement for at least 2-3 more months.  The rest of his CBC and CMP is normal.  REVIEW OF SYSTEMS:  Review of Systems  Constitutional:  Negative for appetite change, chills, fatigue, fever and unexpected weight change.  HENT:   Negative for lump/mass, mouth sores and sore throat.   Respiratory:  Negative for cough and shortness of breath.   Cardiovascular:  Negative for chest pain and leg swelling.  Gastrointestinal:  Negative for abdominal pain, constipation, diarrhea, nausea and vomiting.  Genitourinary:  Negative for difficulty urinating, dysuria, frequency and hematuria.   Musculoskeletal:  Negative for arthralgias, back pain and myalgias.  Skin:  Negative for itching, rash and wound.  Neurological:  Negative for dizziness, extremity weakness, headaches, light-headedness and numbness.  Hematological:  Negative for adenopathy.  Psychiatric/Behavioral:  Negative for depression and sleep disturbance. The patient is not nervous/anxious.      VITALS:  Blood pressure (!) 147/100, pulse 91, temperature 98 F (36.7 C), temperature source Oral, resp. rate 18, height 6' 6"  (1.981 m), weight 269 lb 9.6 oz (122.3 kg), SpO2 98 %.  Wt Readings from Last 3 Encounters:  03/09/22 270 lb (122.5 kg)  03/07/22 270 lb (122.5 kg)  03/02/22 266 lb 1.6 oz (120.7 kg)    Body mass index is 31.16 kg/m.  Performance status (ECOG): 1 - Symptomatic but completely ambulatory  PHYSICAL EXAM:  Physical Exam Vitals and nursing note reviewed.  Constitutional:      General: He is not in acute distress.    Appearance: Normal appearance. He is normal weight.  HENT:     Head: Normocephalic and atraumatic.      Mouth/Throat:     Mouth: Mucous membranes are moist.     Pharynx: Oropharynx is clear. No oropharyngeal exudate or posterior oropharyngeal erythema.  Eyes:     General: No scleral icterus.    Extraocular Movements: Extraocular movements intact.     Conjunctiva/sclera: Conjunctivae normal.     Pupils: Pupils are equal, round, and reactive to light.  Cardiovascular:  Rate and Rhythm: Normal rate and regular rhythm.     Heart sounds: Normal heart sounds. No murmur heard.    No friction rub. No gallop.  Pulmonary:     Effort: Pulmonary effort is normal.     Breath sounds: Normal breath sounds. No wheezing, rhonchi or rales.  Abdominal:     General: Bowel sounds are normal. There is no distension.     Palpations: Abdomen is soft. There is no hepatomegaly, splenomegaly or mass.     Tenderness: There is no abdominal tenderness.  Musculoskeletal:        General: Normal range of motion.     Cervical back: Normal range of motion and neck supple. No tenderness.     Right lower leg: No edema.     Left lower leg: No edema.  Lymphadenopathy:     Cervical: No cervical adenopathy.     Upper Body:     Right upper body: No supraclavicular or axillary adenopathy.     Left upper body: No supraclavicular or axillary adenopathy.     Lower Body: No right inguinal adenopathy. No left inguinal adenopathy.  Skin:    General: Skin is warm and dry.     Coloration: Skin is not jaundiced.     Findings: No rash.  Neurological:     Mental Status: He is alert and oriented to person, place, and time.     Cranial Nerves: No cranial nerve deficit.  Psychiatric:        Mood and Affect: Mood normal.        Behavior: Behavior normal.        Thought Content: Thought content normal.     LABS:      Latest Ref Rng & Units 03/02/2022   12:00 AM 02/17/2022   12:00 AM 02/03/2022   12:00 AM  CBC  WBC  3.4     4.8     5.6      Hemoglobin 13.5 - 17.5 12.7     12.6     12.1      Hematocrit 41 - 53 39     38      37      Platelets 150 - 400 K/uL 222     316     254         This result is from an external source.      Latest Ref Rng & Units 03/02/2022   12:00 AM 02/17/2022   12:00 AM 02/03/2022   12:00 AM  CMP  BUN 4 - 21 15     15     15       Creatinine 0.6 - 1.3 1.1     1.0     1.0      Sodium 137 - 147 140     140     139      Potassium 3.5 - 5.1 mEq/L 4.0     4.0     4.2      Chloride 99 - 108 106     105     106      CO2 13 - 22 27     26     26       Calcium 8.7 - 10.7 9.2     9.2     9.2      Alkaline Phos 25 - 125 74     86     103      AST 14 -  40 33     33     32      ALT 10 - 40 U/L 22     23     34         This result is from an external source.     Lab Results  Component Value Date   CEA1 0.8 01/06/2022   /  CEA  Date Value Ref Range Status  01/06/2022 0.8 0.0 - 4.7 ng/mL Final    Comment:    (NOTE)                             Nonsmokers          <3.9                             Smokers             <5.6 [employer] Electrochemiluminescence Immunoassay (ECLIA) Values obtained with different assay methods or kits cannot be used interchangeably.  Results cannot be interpreted as absolute evidence of the presence or absence of malignant disease. Performed At: [facility] [place], [place] [id number] Rush Farmer MD HF:[id number]    No results found for: "PSA1" No results found for: "CAN199" No results found for: "CAN125"  No results found for: "TOTALPROTELP", "ALBUMINELP", "A1GS", "A2GS", "BETS", "BETA2SER", "GAMS", "MSPIKE", "SPEI" Lab Results  Component Value Date   TIBC 225 (L) 11/22/2021   FERRITIN 292 11/22/2021   IRONPCTSAT 6 (L) 11/22/2021   No results found for: "LDH"  STUDIES:  No results found.    HISTORY:   Past Medical History:  Diagnosis Date   GERD (gastroesophageal reflux disease)    Renal lithiasis     Past Surgical History:  Procedure Laterality Date   LIVER RESECTION Right 07/02/2019   LIVER  RESECTION Right 10/12/2021   SIGMOIDECTOMY  03/2016    Family History  Problem Relation Age of Onset   Lung cancer Father 18   Leukemia Father 4       acute myelogenous   Colon cancer Sister 61    Social History:  reports that he has quit smoking. He has never used smokeless tobacco. He reports that he does not currently use alcohol. No history on file for drug use.The patient is accompanied by his wife today.  Allergies: No Known Allergies  Current Medications: Current Outpatient Medications  Medication Sig Dispense Refill   ascorbic acid (VITAMIN C) 1000 MG tablet Take by mouth.     benzonatate (TESSALON) 200 MG capsule Take 1 capsule (200 mg total) by mouth 3 (three) times daily as needed for cough. (Patient not taking: Reported on 03/02/2022) 20 capsule 1   Calcium Polycarbophil (FIBER) 625 MG TABS Take by mouth.     cetirizine (ZYRTEC) 10 MG tablet Take by mouth.     Cholecalciferol (VITAMIN D) 50 MCG (2000 UT) tablet Take by mouth.     Docusate Sodium (DSS) 100 MG CAPS Take 1 capsule by mouth daily.     ferrous sulfate 325 (65 FE) MG tablet Take 1 capsule by mouth every morning.     hydrocortisone (ANUSOL-HC) 2.5 % rectal cream Place 1 Application rectally 2 (two) times daily. 30 g 0   loperamide (IMODIUM) 2 MG capsule Take 1 capsule (2 mg total) by mouth as needed for diarrhea or loose stools. Take 2 at diarrhea onset , then  1 every 2hr until 12hrs with no BM. May take 2 every 4hrs at night. If diarrhea recurs repeat. 100 capsule 5   LORazepam (ATIVAN) 1 MG tablet Take 1 tablet (1 mg total) by mouth every 6 (six) hours as needed for anxiety or sleep. 100 tablet 0   Multiple Vitamin (MULTI-VITAMIN) tablet Take 1 tablet by mouth daily.     omeprazole (PRILOSEC) 20 MG capsule Take by mouth.     ondansetron (ZOFRAN) 8 MG tablet Take 1 tablet (8 mg total) by mouth 2 (two) times daily as needed for refractory nausea / vomiting. Start on day 3 after chemotherapy. 30 tablet 1    ondansetron (ZOFRAN-ODT) 4 MG disintegrating tablet Take 4 mg by mouth every 8 (eight) hours as needed. (Patient not taking: Reported on 03/02/2022)     oxyCODONE (OXY IR/ROXICODONE) 5 MG immediate release tablet Take by mouth.     Probiotic Product (PROBIOTIC BLEND PO) Take 1 tablet by mouth daily.     prochlorperazine (COMPAZINE) 10 MG tablet Take 1 tablet (10 mg total) by mouth every 6 (six) hours as needed (NAUSEA). (Patient not taking: Reported on 03/02/2022) 30 tablet 1   vitamin B-12 (CYANOCOBALAMIN) 250 MCG tablet Take by mouth.     zinc gluconate 50 MG tablet Take 50 mg by mouth daily.     No current facility-administered medications for this visit.   Facility-Administered Medications Ordered in Other Visits  Medication Dose Route Frequency Provider Last Rate Last Admin   atropine 1 MG/ML injection            palonosetron (ALOXI) 0.25 MG/5ML injection

## 2022-02-18 ENCOUNTER — Other Ambulatory Visit: Payer: Self-pay

## 2022-02-20 ENCOUNTER — Encounter: Payer: Self-pay | Admitting: Oncology

## 2022-02-20 MED FILL — Irinotecan HCl Inj 100 MG/5ML (20 MG/ML): INTRAVENOUS | Qty: 23 | Status: AC

## 2022-02-20 MED FILL — Leucovorin Calcium For Inj 350 MG: INTRAMUSCULAR | Qty: 51 | Status: AC

## 2022-02-20 MED FILL — Fluorouracil IV Soln 2.5 GM/50ML (50 MG/ML): INTRAVENOUS | Qty: 20 | Status: AC

## 2022-02-20 MED FILL — Bevacizumab-awwb IV Soln 400 MG/16ML (For Infusion): INTRAVENOUS | Qty: 24 | Status: AC

## 2022-02-20 MED FILL — Fluorouracil IV Soln 5 GM/100ML (50 MG/ML): INTRAVENOUS | Qty: 122 | Status: AC

## 2022-02-20 MED FILL — Dexamethasone Sodium Phosphate Inj 100 MG/10ML: INTRAMUSCULAR | Qty: 1 | Status: AC

## 2022-02-21 ENCOUNTER — Inpatient Hospital Stay: Payer: BC Managed Care – PPO

## 2022-02-21 VITALS — BP 139/92 | HR 89 | Temp 98.4°F | Resp 18 | Ht 78.0 in | Wt 277.0 lb

## 2022-02-21 DIAGNOSIS — Z23 Encounter for immunization: Secondary | ICD-10-CM | POA: Diagnosis not present

## 2022-02-21 DIAGNOSIS — Z87891 Personal history of nicotine dependence: Secondary | ICD-10-CM | POA: Diagnosis not present

## 2022-02-21 DIAGNOSIS — C787 Secondary malignant neoplasm of liver and intrahepatic bile duct: Secondary | ICD-10-CM

## 2022-02-21 DIAGNOSIS — C189 Malignant neoplasm of colon, unspecified: Secondary | ICD-10-CM | POA: Diagnosis not present

## 2022-02-21 DIAGNOSIS — R21 Rash and other nonspecific skin eruption: Secondary | ICD-10-CM | POA: Diagnosis not present

## 2022-02-21 DIAGNOSIS — Z5111 Encounter for antineoplastic chemotherapy: Secondary | ICD-10-CM | POA: Diagnosis not present

## 2022-02-21 DIAGNOSIS — Z79899 Other long term (current) drug therapy: Secondary | ICD-10-CM | POA: Diagnosis not present

## 2022-02-21 DIAGNOSIS — C187 Malignant neoplasm of sigmoid colon: Secondary | ICD-10-CM

## 2022-02-21 DIAGNOSIS — Z5112 Encounter for antineoplastic immunotherapy: Secondary | ICD-10-CM | POA: Diagnosis not present

## 2022-02-21 DIAGNOSIS — D500 Iron deficiency anemia secondary to blood loss (chronic): Secondary | ICD-10-CM | POA: Diagnosis not present

## 2022-02-21 MED ORDER — IRINOTECAN HCL CHEMO INJECTION 100 MG/5ML
180.0000 mg/m2 | Freq: Once | INTRAVENOUS | Status: AC
  Administered 2022-02-21: 460 mg via INTRAVENOUS
  Filled 2022-02-21: qty 15

## 2022-02-21 MED ORDER — PALONOSETRON HCL INJECTION 0.25 MG/5ML
0.2500 mg | Freq: Once | INTRAVENOUS | Status: AC
  Administered 2022-02-21: 0.25 mg via INTRAVENOUS
  Filled 2022-02-21: qty 5

## 2022-02-21 MED ORDER — FLUOROURACIL CHEMO INJECTION 2.5 GM/50ML
398.0000 mg/m2 | Freq: Once | INTRAVENOUS | Status: AC
  Administered 2022-02-21: 1000 mg via INTRAVENOUS
  Filled 2022-02-21: qty 20

## 2022-02-21 MED ORDER — LEUCOVORIN CALCIUM INJECTION 350 MG
397.0000 mg/m2 | Freq: Once | INTRAVENOUS | Status: AC
  Administered 2022-02-21: 1020 mg via INTRAVENOUS
  Filled 2022-02-21: qty 51

## 2022-02-21 MED ORDER — SODIUM CHLORIDE 0.9 % IV SOLN
10.0000 mg | Freq: Once | INTRAVENOUS | Status: AC
  Administered 2022-02-21: 10 mg via INTRAVENOUS
  Filled 2022-02-21: qty 10

## 2022-02-21 MED ORDER — ATROPINE SULFATE 1 MG/ML IV SOLN
0.5000 mg | Freq: Once | INTRAVENOUS | Status: AC | PRN
  Administered 2022-02-21: 0.5 mg via INTRAVENOUS
  Filled 2022-02-21: qty 1

## 2022-02-21 MED ORDER — SODIUM CHLORIDE 0.9 % IV SOLN: Freq: Once | INTRAVENOUS | Status: AC

## 2022-02-21 MED ORDER — SODIUM CHLORIDE 0.9 % IV SOLN
5.0000 mg/kg | Freq: Once | INTRAVENOUS | Status: AC
  Administered 2022-02-21: 600 mg via INTRAVENOUS
  Filled 2022-02-21: qty 16

## 2022-02-21 MED ORDER — SODIUM CHLORIDE 0.9 % IV SOLN
2375.0000 mg/m2 | INTRAVENOUS | Status: DC
  Administered 2022-02-21: 6100 mg via INTRAVENOUS
  Filled 2022-02-21: qty 50

## 2022-02-21 NOTE — Patient Instructions (Signed)
Fluorouracil Injection What is this medication? FLUOROURACIL (flure oh YOOR a sil) treats some types of cancer. It works by slowing down the growth of cancer cells. This medicine may be used for other purposes; ask your health care provider or pharmacist if you have questions. COMMON BRAND NAME(S): Adrucil What should I tell my care team before I take this medication? They need to know if you have any of these conditions: Blood disorders Dihydropyrimidine dehydrogenase (DPD) deficiency Infection, such as chickenpox, cold sores, herpes Kidney disease Liver disease Poor nutrition Recent or ongoing radiation therapy An unusual or allergic reaction to fluorouracil, other medications, foods, dyes, or preservatives If you or your partner are pregnant or trying to get pregnant Breast-feeding How should I use this medication? This medication is injected into a vein. It is administered by your care team in a hospital or clinic setting. Talk to your care team about the use of this medication in children. Special care may be needed. Overdosage: If you think you have taken too much of this medicine contact a poison control center or emergency room at once. NOTE: This medicine is only for you. Do not share this medicine with others. What if I miss a dose? Keep appointments for follow-up doses. It is important not to miss your dose. Call your care team if you are unable to keep an appointment. What may interact with this medication? Do not take this medication with any of the following: Live virus vaccines This medication may also interact with the following: Medications that treat or prevent blood clots, such as warfarin, enoxaparin, dalteparin This list may not describe all possible interactions. Give your health care provider a list of all the medicines, herbs, non-prescription drugs, or dietary supplements you use. Also tell them if you smoke, drink alcohol, or use illegal drugs. Some items may  interact with your medicine. What should I watch for while using this medication? Your condition will be monitored carefully while you are receiving this medication. This medication may make you feel generally unwell. This is not uncommon as chemotherapy can affect healthy cells as well as cancer cells. Report any side effects. Continue your course of treatment even though you feel ill unless your care team tells you to stop. In some cases, you may be given additional medications to help with side effects. Follow all directions for their use. This medication may increase your risk of getting an infection. Call your care team for advice if you get a fever, chills, sore throat, or other symptoms of a cold or flu. Do not treat yourself. Try to avoid being around people who are sick. This medication may increase your risk to bruise or bleed. Call your care team if you notice any unusual bleeding. Be careful brushing or flossing your teeth or using a toothpick because you may get an infection or bleed more easily. If you have any dental work done, tell your dentist you are receiving this medication. Avoid taking medications that contain aspirin, acetaminophen, ibuprofen, naproxen, or ketoprofen unless instructed by your care team. These medications may hide a fever. Do not treat diarrhea with over the counter products. Contact your care team if you have diarrhea that lasts more than 2 days or if it is severe and watery. This medication can make you more sensitive to the sun. Keep out of the sun. If you cannot avoid being in the sun, wear protective clothing and sunscreen. Do not use sun lamps, tanning beds, or tanning booths. Talk to   your care team if you or your partner wish to become pregnant or think you might be pregnant. This medication can cause serious birth defects if taken during pregnancy and for 3 months after the last dose. A reliable form of contraception is recommended while taking this  medication and for 3 months after the last dose. Talk to your care team about effective forms of contraception. Do not father a child while taking this medication and for 3 months after the last dose. Use a condom while having sex during this time period. Do not breastfeed while taking this medication. This medication may cause infertility. Talk to your care team if you are concerned about your fertility. What side effects may I notice from receiving this medication? Side effects that you should report to your care team as soon as possible: Allergic reactions--skin rash, itching, hives, swelling of the face, lips, tongue, or throat Heart attack--pain or tightness in the chest, shoulders, arms, or jaw, nausea, shortness of breath, cold or clammy skin, feeling faint or lightheaded Heart failure--shortness of breath, swelling of the ankles, feet, or hands, sudden weight gain, unusual weakness or fatigue Heart rhythm changes--fast or irregular heartbeat, dizziness, feeling faint or lightheaded, chest pain, trouble breathing High ammonia level--unusual weakness or fatigue, confusion, loss of appetite, nausea, vomiting, seizures Infection--fever, chills, cough, sore throat, wounds that don't heal, pain or trouble when passing urine, general feeling of discomfort or being unwell Low red blood cell level--unusual weakness or fatigue, dizziness, headache, trouble breathing Pain, tingling, or numbness in the hands or feet, muscle weakness, change in vision, confusion or trouble speaking, loss of balance or coordination, trouble walking, seizures Redness, swelling, and blistering of the skin over hands and feet Severe or prolonged diarrhea Unusual bruising or bleeding Side effects that usually do not require medical attention (report to your care team if they continue or are bothersome): Dry skin Headache Increased tears Nausea Pain, redness, or swelling with sores inside the mouth or throat Sensitivity  to light Vomiting This list may not describe all possible side effects. Call your doctor for medical advice about side effects. You may report side effects to FDA at 1-800-FDA-1088. Where should I keep my medication? This medication is given in a hospital or clinic. It will not be stored at home. NOTE: This sheet is a summary. It may not cover all possible information. If you have questions about this medicine, talk to your doctor, pharmacist, or health care provider.  2023 Else[employer]/Gold Standard (2021-08-30 00:00:00) Bevacizumab Injection What is this medication? BEVACIZUMAB (be va SIZ yoo mab) treats some types of cancer. It works by blocking a protein that causes cancer cells to grow and multiply. This helps to slow or stop the spread of cancer cells. It is a monoclonal antibody. This medicine may be used for other purposes; ask your health care provider or pharmacist if you have questions. COMMON BRAND NAME(S): Alymsys, Avastin, MVASI, Noah Charon What should I tell my care team before I take this medication? They need to know if you have any of these conditions: Blood clots Coughing up blood Having or recent surgery Heart failure High blood pressure History of a connection between 2 or more body parts that do not usually connect (fistula) History of a tear in your stomach or intestines Protein in your urine An unusual or allergic reaction to bevacizumab, other medications, foods, dyes, or preservatives Pregnant or trying to get pregnant Breast-feeding How should I use this medication? This medication is injected into a  vein. It is given by your care team in a hospital or clinic setting. Talk to your care team the use of this medication in children. Special care may be needed. Overdosage: If you think you have taken too much of this medicine contact a poison control center or emergency room at once. NOTE: This medicine is only for you. Do not share this medicine with others. What if I  miss a dose? Keep appointments for follow-up doses. It is important not to miss your dose. Call your care team if you are unable to keep an appointment. What may interact with this medication? Interactions are not expected. This list may not describe all possible interactions. Give your health care provider a list of all the medicines, herbs, non-prescription drugs, or dietary supplements you use. Also tell them if you smoke, drink alcohol, or use illegal drugs. Some items may interact with your medicine. What should I watch for while using this medication? Your condition will be monitored carefully while you are receiving this medication. You may need blood work while taking this medication. This medication may make you feel generally unwell. This is not uncommon as chemotherapy can affect healthy cells as well as cancer cells. Report any side effects. Continue your course of treatment even though you feel ill unless your care team tells you to stop. This medication may increase your risk to bruise or bleed. Call your care team if you notice any unusual bleeding. Before having surgery, talk to your care team to make sure it is ok. This medication can increase the risk of poor healing of your surgical site or wound. You will need to stop this medication for 28 days before surgery. After surgery, wait at least 28 days before restarting this medication. Make sure the surgical site or wound is healed enough before restarting this medication. Talk to your care team if questions. Talk to your care team if you may be pregnant. Serious birth defects can occur if you take this medication during pregnancy and for 6 months after the last dose. Contraception is recommended while taking this medication and for 6 months after the last dose. Your care team can help you find the option that works for you. Do not breastfeed while taking this medication and for 6 months after the last dose. This medication can cause  infertility. Talk to your care team if you are concerned about your fertility. What side effects may I notice from receiving this medication? Side effects that you should report to your care team as soon as possible: Allergic reactions--skin rash, itching, hives, swelling of the face, lips, tongue, or throat Bleeding--bloody or black, tar-like stools, vomiting blood or brown material that looks like coffee grounds, red or dark brown urine, small red or purple spots on skin, unusual bruising or bleeding Blood clot--pain, swelling, or warmth in the leg, shortness of breath, chest pain Heart attack--pain or tightness in the chest, shoulders, arms, or jaw, nausea, shortness of breath, cold or clammy skin, feeling faint or lightheaded Heart failure--shortness of breath, swelling of the ankles, feet, or hands, sudden weight gain, unusual weakness or fatigue Increase in blood pressure Infection--fever, chills, cough, sore throat, wounds that don't heal, pain or trouble when passing urine, general feeling of discomfort or being unwell Infusion reactions--chest pain, shortness of breath or trouble breathing, feeling faint or lightheaded Kidney injury--decrease in the amount of urine, swelling of the ankles, hands, or feet Stomach pain that is severe, does not go away, or gets  worse Stroke--sudden numbness or weakness of the face, arm, or leg, trouble speaking, confusion, trouble walking, loss of balance or coordination, dizziness, severe headache, change in vision Sudden and severe headache, confusion, change in vision, seizures, which may be signs of posterior reversible encephalopathy syndrome (PRES) Side effects that usually do not require medical attention (report to your care team if they continue or are bothersome): Back pain Change in taste Diarrhea Dry skin Increased tears Nosebleed This list may not describe all possible side effects. Call your doctor for medical advice about side effects. You  may report side effects to FDA at 1-800-FDA-1088. Where should I keep my medication? This medication is given in a hospital or clinic. It will not be stored at home. NOTE: This sheet is a summary. It may not cover all possible information. If you have questions about this medicine, talk to your doctor, pharmacist, or health care provider.  2023 Else[employer]/Gold Standard (2021-09-06 00:00:00) Fluorouracil Injection What is this medication? FLUOROURACIL (flure oh YOOR a sil) treats some types of cancer. It works by slowing down the growth of cancer cells. This medicine may be used for other purposes; ask your health care provider or pharmacist if you have questions. COMMON BRAND NAME(S): Adrucil What should I tell my care team before I take this medication? They need to know if you have any of these conditions: Blood disorders Dihydropyrimidine dehydrogenase (DPD) deficiency Infection, such as chickenpox, cold sores, herpes Kidney disease Liver disease Poor nutrition Recent or ongoing radiation therapy An unusual or allergic reaction to fluorouracil, other medications, foods, dyes, or preservatives If you or your partner are pregnant or trying to get pregnant Breast-feeding How should I use this medication? This medication is injected into a vein. It is administered by your care team in a hospital or clinic setting. Talk to your care team about the use of this medication in children. Special care may be needed. Overdosage: If you think you have taken too much of this medicine contact a poison control center or emergency room at once. NOTE: This medicine is only for you. Do not share this medicine with others. What if I miss a dose? Keep appointments for follow-up doses. It is important not to miss your dose. Call your care team if you are unable to keep an appointment. What may interact with this medication? Do not take this medication with any of the following: Live virus vaccines This  medication may also interact with the following: Medications that treat or prevent blood clots, such as warfarin, enoxaparin, dalteparin This list may not describe all possible interactions. Give your health care provider a list of all the medicines, herbs, non-prescription drugs, or dietary supplements you use. Also tell them if you smoke, drink alcohol, or use illegal drugs. Some items may interact with your medicine. What should I watch for while using this medication? Your condition will be monitored carefully while you are receiving this medication. This medication may make you feel generally unwell. This is not uncommon as chemotherapy can affect healthy cells as well as cancer cells. Report any side effects. Continue your course of treatment even though you feel ill unless your care team tells you to stop. In some cases, you may be given additional medications to help with side effects. Follow all directions for their use. This medication may increase your risk of getting an infection. Call your care team for advice if you get a fever, chills, sore throat, or other symptoms of a cold or flu.  Do not treat yourself. Try to avoid being around people who are sick. This medication may increase your risk to bruise or bleed. Call your care team if you notice any unusual bleeding. Be careful brushing or flossing your teeth or using a toothpick because you may get an infection or bleed more easily. If you have any dental work done, tell your dentist you are receiving this medication. Avoid taking medications that contain aspirin, acetaminophen, ibuprofen, naproxen, or ketoprofen unless instructed by your care team. These medications may hide a fever. Do not treat diarrhea with over the counter products. Contact your care team if you have diarrhea that lasts more than 2 days or if it is severe and watery. This medication can make you more sensitive to the sun. Keep out of the sun. If you cannot avoid being  in the sun, wear protective clothing and sunscreen. Do not use sun lamps, tanning beds, or tanning booths. Talk to your care team if you or your partner wish to become pregnant or think you might be pregnant. This medication can cause serious birth defects if taken during pregnancy and for 3 months after the last dose. A reliable form of contraception is recommended while taking this medication and for 3 months after the last dose. Talk to your care team about effective forms of contraception. Do not father a child while taking this medication and for 3 months after the last dose. Use a condom while having sex during this time period. Do not breastfeed while taking this medication. This medication may cause infertility. Talk to your care team if you are concerned about your fertility. What side effects may I notice from receiving this medication? Side effects that you should report to your care team as soon as possible: Allergic reactions--skin rash, itching, hives, swelling of the face, lips, tongue, or throat Heart attack--pain or tightness in the chest, shoulders, arms, or jaw, nausea, shortness of breath, cold or clammy skin, feeling faint or lightheaded Heart failure--shortness of breath, swelling of the ankles, feet, or hands, sudden weight gain, unusual weakness or fatigue Heart rhythm changes--fast or irregular heartbeat, dizziness, feeling faint or lightheaded, chest pain, trouble breathing High ammonia level--unusual weakness or fatigue, confusion, loss of appetite, nausea, vomiting, seizures Infection--fever, chills, cough, sore throat, wounds that don't heal, pain or trouble when passing urine, general feeling of discomfort or being unwell Low red blood cell level--unusual weakness or fatigue, dizziness, headache, trouble breathing Pain, tingling, or numbness in the hands or feet, muscle weakness, change in vision, confusion or trouble speaking, loss of balance or coordination, trouble  walking, seizures Redness, swelling, and blistering of the skin over hands and feet Severe or prolonged diarrhea Unusual bruising or bleeding Side effects that usually do not require medical attention (report to your care team if they continue or are bothersome): Dry skin Headache Increased tears Nausea Pain, redness, or swelling with sores inside the mouth or throat Sensitivity to light Vomiting This list may not describe all possible side effects. Call your doctor for medical advice about side effects. You may report side effects to FDA at 1-800-FDA-1088. Where should I keep my medication? This medication is given in a hospital or clinic. It will not be stored at home. NOTE: This sheet is a summary. It may not cover all possible information. If you have questions about this medicine, talk to your doctor, pharmacist, or health care provider.  2023 Else[employer]/Gold Standard (2021-08-30 00:00:00) Leucovorin Injection What is this medication? LEUCOVORIN (loo koe  VOR in) prevents side effects from certain medications, such as methotrexate. It works by increasing folate levels. This helps protect healthy cells in your body. It may also be used to treat anemia caused by low levels of folate. It can also be used with fluorouracil, a type of chemotherapy, to treat colorectal cancer. It works by increasing the effects of fluorouracil in the body. This medicine may be used for other purposes; ask your health care provider or pharmacist if you have questions. What should I tell my care team before I take this medication? They need to know if you have any of these conditions: Anemia from low levels of vitamin B12 in the blood An unusual or allergic reaction to leucovorin, folic acid, other medications, foods, dyes, or preservatives Pregnant or trying to get pregnant Breastfeeding How should I use this medication? This medication is injected into a vein or a muscle. It is given by your care team in a  hospital or clinic setting. Talk to your care team about the use of this medication in children. Special care may be needed. Overdosage: If you think you have taken too much of this medicine contact a poison control center or emergency room at once. NOTE: This medicine is only for you. Do not share this medicine with others. What if I miss a dose? Keep appointments for follow-up doses. It is important not to miss your dose. Call your care team if you are unable to keep an appointment. What may interact with this medication? Capecitabine Fluorouracil Phenobarbital Phenytoin Primidone Trimethoprim;sulfamethoxazole This list may not describe all possible interactions. Give your health care provider a list of all the medicines, herbs, non-prescription drugs, or dietary supplements you use. Also tell them if you smoke, drink alcohol, or use illegal drugs. Some items may interact with your medicine. What should I watch for while using this medication? Your condition will be monitored carefully while you are receiving this medication. This medication may increase the side effects of 5-fluorouracil. Tell your care team if you have diarrhea or mouth sores that do not get better or that get worse. What side effects may I notice from receiving this medication? Side effects that you should report to your care team as soon as possible: Allergic reactions--skin rash, itching, hives, swelling of the face, lips, tongue, or throat This list may not describe all possible side effects. Call your doctor for medical advice about side effects. You may report side effects to FDA at 1-800-FDA-1088. Where should I keep my medication? This medication is given in a hospital or clinic. It will not be stored at home. NOTE: This sheet is a summary. It may not cover all possible information. If you have questions about this medicine, talk to your doctor, pharmacist, or health care provider.  2023 [employer]/Gold Standard  (2021-09-02 00:00:00) Irinotecan Injection What is this medication? IRINOTECAN (ir in oh TEE kan) treats some types of cancer. It works by slowing down the growth of cancer cells. This medicine may be used for other purposes; ask your health care provider or pharmacist if you have questions. COMMON BRAND NAME(S): Camptosar What should I tell my care team before I take this medication? They need to know if you have any of these conditions: Dehydration Diarrhea Infection, especially a viral infection, such as chickenpox, cold sores, herpes Liver disease Low blood cell levels (white cells, red cells, and platelets) Low levels of electrolytes, such as calcium, magnesium, or potassium in your blood Recent or ongoing radiation An  unusual or allergic reaction to irinotecan, other medications, foods, dyes, or preservatives If you or your partner are pregnant or trying to get pregnant Breast-feeding How should I use this medication? This medication is injected into a vein. It is given by your care team in a hospital or clinic setting. Talk to your care team about the use of this medication in children. Special care may be needed. Overdosage: If you think you have taken too much of this medicine contact a poison control center or emergency room at once. NOTE: This medicine is only for you. Do not share this medicine with others. What if I miss a dose? Keep appointments for follow-up doses. It is important not to miss your dose. Call your care team if you are unable to keep an appointment. What may interact with this medication? Do not take this medication with any of the following: Cobicistat Itraconazole This medication may also interact with the following: Certain antibiotics, such as clarithromycin, rifampin, rifabutin Certain antivirals for HIV or AIDS Certain medications for fungal infections, such as ketoconazole, posaconazole, voriconazole Certain medications for seizures, such as  carbamazepine, phenobarbital, phenytoin Gemfibrozil Nefazodone St. John's wort This list may not describe all possible interactions. Give your health care provider a list of all the medicines, herbs, non-prescription drugs, or dietary supplements you use. Also tell them if you smoke, drink alcohol, or use illegal drugs. Some items may interact with your medicine. What should I watch for while using this medication? Your condition will be monitored carefully while you are receiving this medication. You may need blood work while taking this medication. This medication may make you feel generally unwell. This is not uncommon as chemotherapy can affect healthy cells as well as cancer cells. Report any side effects. Continue your course of treatment even though you feel ill unless your care team tells you to stop. This medication can cause serious side effects. To reduce the risk, your care team may give you other medications to take before receiving this one. Be sure to follow the directions from your care team. This medication may affect your coordination, reaction time, or judgement. Do not drive or operate machinery until you know how this medication affects you. Sit up or stand slowly to reduce the risk of dizzy or fainting spells. Drinking alcohol with this medication can increase the risk of these side effects. This medication may increase your risk of getting an infection. Call your care team for advice if you get a fever, chills, sore throat, or other symptoms of a cold or flu. Do not treat yourself. Try to avoid being around people who are sick. Avoid taking medications that contain aspirin, acetaminophen, ibuprofen, naproxen, or ketoprofen unless instructed by your care team. These medications may hide a fever. This medication may increase your risk to bruise or bleed. Call your care team if you notice any unusual bleeding. Be careful brushing or flossing your teeth or using a toothpick because  you may get an infection or bleed more easily. If you have any dental work done, tell your dentist you are receiving this medication. Talk to your care team if you or your partner are pregnant or think either of you might be pregnant. This medication can cause serious birth defects if taken during pregnancy and for 6 months after the last dose. You will need a negative pregnancy test before starting this medication. Contraception is recommended while taking this medication and for 6 months after the last dose. Your care team  can help you find the option that works for you. Do not father a child while taking this medication and for 3 months after the last dose. Use a condom for contraception during this time period. Do not breastfeed while taking this medication and for 7 days after the last dose. This medication may cause infertility. Talk to your care team if you are concerned about your fertility. What side effects may I notice from receiving this medication? Side effects that you should report to your care team as soon as possible: Allergic reactions--skin rash, itching, hives, swelling of the face, lips, tongue, or throat Dry cough, shortness of breath or trouble breathing Increased saliva or tears, increased sweating, stomach cramping, diarrhea, small pupils, unusual weakness or fatigue, slow heartbeat Infection--fever, chills, cough, sore throat, wounds that don't heal, pain or trouble when passing urine, general feeling of discomfort or being unwell Kidney injury--decrease in the amount of urine, swelling of the ankles, hands, or feet Low red blood cell level--unusual weakness or fatigue, dizziness, headache, trouble breathing Severe or prolonged diarrhea Unusual bruising or bleeding Side effects that usually do not require medical attention (report to your care team if they continue or are bothersome): Constipation Diarrhea Hair loss Loss of appetite Nausea Stomach pain This list may  not describe all possible side effects. Call your doctor for medical advice about side effects. You may report side effects to FDA at 1-800-FDA-1088. Where should I keep my medication? This medication is given in a hospital or clinic. It will not be stored at home. NOTE: This sheet is a summary. It may not cover all possible information. If you have questions about this medicine, talk to your doctor, pharmacist, or health care provider.  2023 Elsevier/Gold Standard (2021-09-01 00:00:00)

## 2022-02-23 ENCOUNTER — Inpatient Hospital Stay: Payer: BC Managed Care – PPO

## 2022-02-23 VITALS — BP 149/100 | HR 72 | Temp 97.9°F | Resp 18

## 2022-02-23 DIAGNOSIS — C187 Malignant neoplasm of sigmoid colon: Secondary | ICD-10-CM | POA: Diagnosis not present

## 2022-02-23 DIAGNOSIS — C787 Secondary malignant neoplasm of liver and intrahepatic bile duct: Secondary | ICD-10-CM

## 2022-02-23 DIAGNOSIS — D500 Iron deficiency anemia secondary to blood loss (chronic): Secondary | ICD-10-CM | POA: Diagnosis not present

## 2022-02-23 DIAGNOSIS — Z87891 Personal history of nicotine dependence: Secondary | ICD-10-CM | POA: Diagnosis not present

## 2022-02-23 DIAGNOSIS — R21 Rash and other nonspecific skin eruption: Secondary | ICD-10-CM | POA: Diagnosis not present

## 2022-02-23 DIAGNOSIS — Z79899 Other long term (current) drug therapy: Secondary | ICD-10-CM | POA: Diagnosis not present

## 2022-02-23 DIAGNOSIS — Z5111 Encounter for antineoplastic chemotherapy: Secondary | ICD-10-CM | POA: Diagnosis not present

## 2022-02-23 DIAGNOSIS — Z23 Encounter for immunization: Secondary | ICD-10-CM | POA: Diagnosis not present

## 2022-02-23 DIAGNOSIS — Z5112 Encounter for antineoplastic immunotherapy: Secondary | ICD-10-CM | POA: Diagnosis not present

## 2022-02-23 MED ORDER — INFLUENZA VAC SPLIT QUAD 0.5 ML IM SUSY
0.5000 mL | PREFILLED_SYRINGE | Freq: Once | INTRAMUSCULAR | Status: AC
  Administered 2022-02-23: 0.5 mL via INTRAMUSCULAR

## 2022-02-23 MED ORDER — SODIUM CHLORIDE 0.9% FLUSH
10.0000 mL | INTRAVENOUS | Status: DC | PRN
  Administered 2022-02-23: 10 mL

## 2022-02-23 MED ORDER — HEPARIN SOD (PORK) LOCK FLUSH 100 UNIT/ML IV SOLN
500.0000 [IU] | Freq: Once | INTRAVENOUS | Status: AC | PRN
  Administered 2022-02-23: 500 [IU]

## 2022-02-23 NOTE — Patient Instructions (Signed)
Influenza (Flu) Vaccine (Inactivated or Recombinant): What You Need to Know 1. Why get vaccinated? Influenza vaccine can prevent influenza (flu). Flu is a contagious disease that spreads around the [place] every year, usually between October and May. Anyone can get the flu, but it is more dangerous for some people. Infants and young children, people 65 years and older, pregnant people, and people with certain health conditions or a weakened immune system are at greatest risk of flu complications. Pneumonia, bronchitis, sinus infections, and ear infections are examples of flu-related complications. If you have a medical condition, such as heart disease, cancer, or diabetes, flu can make it worse. Flu can cause fever and chills, sore throat, muscle aches, fatigue, cough, headache, and runny or stuffy nose. Some people may have vomiting and diarrhea, though this is more common in children than adults. In an average year, thousands of people in the [place] die from flu, and many more are hospitalized. Flu vaccine prevents millions of illnesses and flu-related visits to the doctor each year. 2. Influenza vaccines CDC recommends everyone 6 months and older get vaccinated every flu season. Children 6 months through 8 years of age may need 2 doses during a single flu season. Everyone else needs only 1 dose each flu season. It takes about 2 weeks for protection to develop after vaccination. There are many flu viruses, and they are always changing. Each year a new flu vaccine is made to protect against the influenza viruses believed to be likely to cause disease in the upcoming flu season. Even when the vaccine doesn't exactly match these viruses, it may still provide some protection. Influenza vaccine does not cause flu. Influenza vaccine may be given at the same time as other vaccines. 3. Talk with your health care provider Tell your vaccination provider if the person getting the vaccine: Has had  an allergic reaction after a previous dose of influenza vaccine, or has any severe, life-threatening allergies Has ever had Guillain-Barr Syndrome (also called "GBS") In some cases, your health care provider may decide to postpone influenza vaccination until a future visit. Influenza vaccine can be administered at any time during pregnancy. People who are or will be pregnant during influenza season should receive inactivated influenza vaccine. People with minor illnesses, such as a cold, may be vaccinated. People who are moderately or severely ill should usually wait until they recover before getting influenza vaccine. Your health care provider can give you more information. 4. Risks of a vaccine reaction Soreness, redness, and swelling where the shot is given, fever, muscle aches, and headache can happen after influenza vaccination. There may be a very small increased risk of Guillain-Barr Syndrome (GBS) after inactivated influenza vaccine (the flu shot). Young children who get the flu shot along with pneumococcal vaccine (PCV13) and/or DTaP vaccine at the same time might be slightly more likely to have a seizure caused by fever. Tell your health care provider if a child who is getting flu vaccine has ever had a seizure. People sometimes faint after medical procedures, including vaccination. Tell your provider if you feel dizzy or have vision changes or ringing in the ears. As with any medicine, there is a very remote chance of a vaccine causing a severe allergic reaction, other serious injury, or death. 5. What if there is a serious problem? An allergic reaction could occur after the vaccinated person leaves the clinic. If you see signs of a severe allergic reaction (hives, swelling of the face and throat, difficulty breathing,   a fast heartbeat, dizziness, or weakness), call 9-1-1 and get the person to the nearest hospital. For other signs that concern you, call your health care provider. Adverse  reactions should be reported to the Vaccine Adverse Event Reporting System (VAERS). Your health care provider will usually file this report, or you can do it yourself. Visit the VAERS website at www.vaers.SamedayNews.es or call 205-328-0947. VAERS is only for reporting reactions, and VAERS staff members do not give medical advice. 6. The National Vaccine Injury Compensation Program The [employer] Vaccine Injury Compensation Program (VICP) is a federal program that was created to compensate people who may have been injured by certain vaccines. Claims regarding alleged injury or death due to vaccination have a time limit for filing, which may be as short as two years. Visit the VICP website at GoldCloset.com.ee or call (469)105-7495 to learn about the program and about filing a claim. 7. How can I learn more? Ask your health care provider. Call your local or state health department. Visit the website of the Food and Drug Administration (FDA) for vaccine package inserts and additional information at TraderRating.uy. Contact the Centers for Disease Control and Prevention (CDC): Call 9491050774 (1-800-CDC-INFO) or Visit CDC's website at https://gibson.com/. Source: CDC Vaccine Information Statement Inactivated Influenza Vaccine (12/12/2019) This same material is available at http://www.wolf.info/ for no charge. This information is not intended to replace advice given to you by your health care provider. Make sure you discuss any questions you have with your health care provider. Document Revised: 03/23/2021 Document Reviewed: 01/13/2021 Elsevier Patient Education  [employer].

## 2022-03-02 ENCOUNTER — Encounter: Payer: Self-pay | Admitting: Hematology and Oncology

## 2022-03-02 ENCOUNTER — Inpatient Hospital Stay (HOSPITAL_BASED_OUTPATIENT_CLINIC_OR_DEPARTMENT_OTHER): Payer: BC Managed Care – PPO | Admitting: Hematology and Oncology

## 2022-03-02 ENCOUNTER — Inpatient Hospital Stay: Payer: BC Managed Care – PPO

## 2022-03-02 DIAGNOSIS — Z79899 Other long term (current) drug therapy: Secondary | ICD-10-CM | POA: Diagnosis not present

## 2022-03-02 DIAGNOSIS — D500 Iron deficiency anemia secondary to blood loss (chronic): Secondary | ICD-10-CM | POA: Diagnosis not present

## 2022-03-02 DIAGNOSIS — R21 Rash and other nonspecific skin eruption: Secondary | ICD-10-CM | POA: Diagnosis not present

## 2022-03-02 DIAGNOSIS — C787 Secondary malignant neoplasm of liver and intrahepatic bile duct: Secondary | ICD-10-CM

## 2022-03-02 DIAGNOSIS — C187 Malignant neoplasm of sigmoid colon: Secondary | ICD-10-CM | POA: Diagnosis not present

## 2022-03-02 DIAGNOSIS — Z87891 Personal history of nicotine dependence: Secondary | ICD-10-CM | POA: Diagnosis not present

## 2022-03-02 DIAGNOSIS — Z5111 Encounter for antineoplastic chemotherapy: Secondary | ICD-10-CM | POA: Diagnosis not present

## 2022-03-02 DIAGNOSIS — Z23 Encounter for immunization: Secondary | ICD-10-CM | POA: Diagnosis not present

## 2022-03-02 DIAGNOSIS — Z5112 Encounter for antineoplastic immunotherapy: Secondary | ICD-10-CM | POA: Diagnosis not present

## 2022-03-02 LAB — HEPATIC FUNCTION PANEL
ALT: 22 U/L (ref 10–40)
AST: 33 (ref 14–40)
Alkaline Phosphatase: 74 (ref 25–125)
Bilirubin, Total: 0.5

## 2022-03-02 LAB — CBC AND DIFFERENTIAL
HCT: 39 — AB (ref 41–53)
Hemoglobin: 12.7 — AB (ref 13.5–17.5)
MCV: 79 — AB (ref 80–99)
Neutrophils Absolute: 1.53
Platelets: 222 10*3/uL (ref 150–400)
WBC: 3.4

## 2022-03-02 LAB — TOTAL PROTEIN, URINE DIPSTICK: Protein, ur: NEGATIVE mg/dL

## 2022-03-02 LAB — BASIC METABOLIC PANEL
BUN: 15 (ref 4–21)
CO2: 27 — AB (ref 13–22)
Chloride: 106 (ref 99–108)
Creatinine: 1.1 (ref 0.6–1.3)
Glucose: 107
Potassium: 4 mEq/L (ref 3.5–5.1)
Sodium: 140 (ref 137–147)

## 2022-03-02 LAB — COMPREHENSIVE METABOLIC PANEL
Albumin: 3.9 (ref 3.5–5.0)
Calcium: 9.2 (ref 8.7–10.7)

## 2022-03-02 LAB — CBC: RBC: 4.88 (ref 3.87–5.11)

## 2022-03-02 NOTE — Progress Notes (Signed)
[facility]  [address] [place],  [place]  [address] [phone]  Clinic Day:  03/02/2022  Referring physician: Derwood Kaplan, MD  ASSESSMENT & PLAN:   Assessment & Plan: Malignant neoplasm metastatic to liver [facility]) History of liver metastasis in January 2021 treated with resection alone.  He had recurrent liver metastasis treated with a partial hepatectomy in June.  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  There was 1 negative node but margins were close.  This was attached to the diaphragm but the tissue from the diaphragm is benign.  He had a complicated postoperative course developing an cyst requiring drainage.  He then developed a right pleural effusion in July treated with a VATS procedure.  He is receiving chemotherapy with FOLFIRI and Avastin, which he started in August.  His third cycle of chemotherapy was delayed as he was admitted with sepsis.  He completed IV antibiotics as an outpatient.  CT abdomen and pelvis in the ER in September did not reveal any obvious evidence of recurrence.  There was a 12 mm porta hepatis lymph node, attention on follow-up was recommended.  We will proceed with a 6th cycle of chemotherapy next week.  We will plan to see him back in 2 weeks for repeat clinical assessment prior to a 7th cycle.  Iron deficiency anemia due to chronic blood loss Microcytic anemia, which is stable.  He denies any overt form of blood loss.  Iron studies were equivocal in July.  He was placed on ferrous sulfate daily with improvement in his hemoglobin.  He is on ferrous sulfate daily and his hemoglobin continues to improve, so we will have him continue oral iron for now.    The patient understands the plans discussed today and is in agreement with them.  He knows to contact our office if he develops concerns prior to his next appointment.     Marvia Pickles, PA-C  [facility] AT [facility] [address] [place] [address] Dept: [phone] Dept Fax: [phone]   Orders Placed This Encounter  Procedures   CBC and differential    This external order was created through the Results Console.   CBC    This external order was created through the Results Console.   Basic metabolic panel    This external order was created through the Results Console.   Comprehensive metabolic panel    This external order was created through the Results Console.   Hepatic function panel    This external order was created through the Results Console.      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  He presented with fevers and chills in September 2017 and was found to have diverticulitis, which was treated.  CT at that time revealed a 1.7 cm lesion in the liver, which was nonspecific, as well as ectopic left kidney with a cyst.  He was then brought back for a colonoscopy, which revealed a 3.5 cm mass in the sigmoid.  Biopsy revealed high-grade dysplasia.  CT abdomen and pelvis revealed an indeterminate lesion in the liver in addition to wall thickening in the sigmoid colon.  MRI abdomen revealed the lesion in the liver to represent complex cyst versus vascular lesion, however,  follow-up in 6 months was recommended.  Air contrast barium enema revealed an apple-core lesion of the sigmoid colon.  Baseline CEA was 0.6.  He underwent surgical resection in November 2017.  Pathology revealed a 4 cm, grade 2, adenocarcinoma of the sigmoid colon, as well as chronic active diverticulitis with an area of perforation, however, the tumor was not perforated.  Tumor invaded through the muscularis propria into the pericolorectal tissues.  4/21 nodes were positive for metastasis.   Margins were clear.  MMR and MSI were normal.  KRAS and BRAF mutations were negative.  He had iron deficiency treated with oral iron supplement in the form of Hemocyte daily.  Due to his age of diagnosis and family history he underwent testing for hereditary non polyposis colorectal cancer with the Myriad myRisk Hereditary Cancer Panel test.  This did not reveal any clinically significant mutation or variants of uncertain significance.   Repeat MRI abdomen in January 2018 revealed a stable lesion within the right lobe of the liver, most consistent with benign lesion.  There was new small volume ascites seen.  The patient received adjuvant chemotherapy with FOLFOX  for 12 cycles, which was completed in June.  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  He was placed on gabapentin in July, then noticed abdominal bloating, which he attributed to the gabapentin, so he discontinued that after 1 month.  He was seen for routine follow-up in September 2018 with a repeat MRI abdomen to re-evaluate the liver lesion.  That revealed a large volume ascites with findings suspicious for peritoneal disease.  The lesion in the right lobe of the liver was stable and still assistant with a benign lesion.  The patient underwent paracentesis with removal of 4.7 L of fluid, but fluid cytology was negative for malignancy.  PET scan in early October did not reveal any hypermetabolic activity, but moderate ascites was seen.  Ultrasound-guided peritoneal biopsy was negative for malignancy, revealing adipose tissue with fibrosis, patchy inflammation and minimal atypia.  His case was discussed at tumor conference and he was referred to Dr. Noberto Retort for consideration open biopsy.  Dr. Noberto Retort performed colonoscopy in October 2018, which was negative.  Follow-up colonoscopy in 3 years was recommended.  The patient then underwent exploratory laparotomy with multiple peritoneal  biopsies.  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  We therefore recommended observation for the patient.  In November 2018, he was seen in the emergency room with abdominal pain, nausea and vomiting.  Gallbladder ultrasound revealed cholelithiasis with sludge and polyps, as well as mild gallbladder wall thickening.  Mild ascites was also seen.  CT abdomen and pelvis revealed mild diffuse gallbladder wall edema otherwise unchanged from PET-CT done in October.  He underwent cholecystectomy with findings of acute cholecystitis.  Pathology did not reveal any evidence of malignancy.     He was seen off schedule in April 2019, as he presented to Dr. Janace Aris office with abdominal pain and was found to have an elevated bilirubin and liver transaminases.  Due to the laboratory abnormalities, he underwent repeat MRI abdomen at that time, which revealed a stable lesion in the right hepatic lobe, which was felt to be indeterminate, with a 2nd smaller indeterminate lesion in the right hepatic lobe measuring 12 mm, which was new from previous imaging.  No hepatic steatosis or ascites was  seen.  CEA was normal.  Hepatitis panel and CMV were negative  we felt the laboratory abnormalities were most likely secondary to viral illness.  He was followed closely and underwent a repeat MRI abdomen in June, which remained stable. He had one again in December of 2019, which was stable.  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   MRI abdomen in January 2021 revealed the mass in the junction of segments 7 and 8 in the liver has increased in size compared to the prior exam,  currently 2.9 x 2.1 x 2.5 cm.  The lesion has a branching component, some of which may be from portal vein thrombus or tumor thrombus, even this branching component appears to enhance on subtraction images.   Biopsy in February revealed adenocarcinoma with necrosis, consistent with metastasis from colon primary.  He underwent surgical resection in February with Dr. Rolla Etienne at [facility].  We discussed the option of further chemotherapy, including the risks and benefits, and he opted for surveillance.   MRI abdomen in May 2023 revealed another solitary metastasis in the liver.  Treated with partial hepatectomy in June with Dr. Cecil Cobbs.  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  There was 1 negative node, but margins were close.  This was attached to the diaphragm, but the tissue from the diaphragm was benign.  He had a complicated postoperative course developing an cyst requiring drainage.  He then developed a right pleural effusion in July treated with a VATS procedure.  He is receiving chemotherapy with FOLFIRI and Avastin, which he started in August.  Oncology History  Colon cancer [facility])  01/23/2016 Cancer Staging   Staging form: Colon and Rectum, AJCC 7th Edition - Clinical stage from 01/23/2016: Stage IIIB (T3, N2a, M0) - Signed by Derwood Kaplan, MD on 05/03/2020 Staging comments: 3.5 cm MMR, MSI normal, No mutation of KRAS or BRAF, neg.genetics Rec'd 6 months adjuvant FOLFOX chemotherapy   02/02/2016 Initial Diagnosis   Colon cancer ([place])   12/13/2021 - 12/29/2021 Chemotherapy   Patient is on Treatment Plan : COLORECTAL FOLFIRI / BEVACIZUMAB Q14D     12/13/2021 -  Chemotherapy   Patient is on Treatment Plan : COLORECTAL FOLFIRI + Bevacizumab q14d     Malignant neoplasm metastatic to liver ([place])  05/29/2019 Initial Diagnosis   Malignant neoplasm metastatic to liver ([place])   12/13/2021 - 12/29/2021 Chemotherapy   Patient is on  Treatment Plan : COLORECTAL FOLFIRI / BEVACIZUMAB Q14D     12/13/2021 -  Chemotherapy   Patient is on Treatment Plan : COLORECTAL FOLFIRI + Bevacizumab q14d     01/07/2022 Imaging   CT abdomen and pelvis:  IMPRESSION:  1. Interval right hepatectomy with surgical changes in the dome of  the left liver.  2. 12 mm short axis lymph node identified in the region of the porta  hepatis. Attention on follow-up recommended.  3. Left pelvic kidney with large simple appearing cyst measuring  12.5 cm, increased from 7.8 cm on 03/14/2017.  4. No acute findings in the abdomen or pelvis. Specifically, no  findings to explain the patient's history of abdominal pain and  fever.        INTERVAL HISTORY:  Meredith is here today for repeat clinical assessment prior to a 6th cycle of FOLFIRI/Avastin.  He states he has mild mouth sores for few days after the 5 FU pump is removed.  I will give him a prescription for MBX solution to use.  He has a scattered rash, that waxes and wanes with treatment.  It does not bother him.  He denies fevers or chills. He denies pain. His appetite is good. His weight has been stable.  REVIEW OF SYSTEMS:  Review of Systems  Constitutional:  Negative for appetite change, chills, fatigue, fever and unexpected weight change.  HENT:   Negative for lump/mass, mouth sores and sore throat.   Respiratory:  Negative for cough and shortness of breath.   Cardiovascular:  Negative for chest pain and leg swelling.  Gastrointestinal:  Negative for abdominal pain, constipation, diarrhea, nausea and vomiting.  Genitourinary:  Negative for difficulty urinating, dysuria, frequency and hematuria.   Musculoskeletal:  Negative for arthralgias, back pain and myalgias.  Skin:  Negative for itching, rash and wound.  Neurological:  Negative for dizziness, extremity weakness, headaches, light-headedness and numbness.  Hematological:  Negative for adenopathy.  Psychiatric/Behavioral:  Negative for  depression and sleep disturbance. The patient is not nervous/anxious.      VITALS:  Blood pressure (!) 147/100, pulse 86, temperature 98 F (36.7 C), temperature source Oral, resp. rate 16, height 6' 6" (1.981 m), weight 266 lb 1.6 oz (120.7 kg), SpO2 99 %.  Wt Readings from Last 3 Encounters:  03/02/22 266 lb 1.6 oz (120.7 kg)  02/21/22 277 lb (125.6 kg)  02/17/22 269 lb 9.6 oz (122.3 kg)    Body mass index is 30.75 kg/m.  Performance status (ECOG): 0 - Asymptomatic  PHYSICAL EXAM:  Physical Exam Vitals and nursing note reviewed.  Constitutional:      General: He is not in acute distress.    Appearance: Normal appearance. He is normal weight.  HENT:     Head: Normocephalic and atraumatic.     Mouth/Throat:     Mouth: Mucous membranes are moist.     Pharynx: Oropharynx is clear. No oropharyngeal exudate or posterior oropharyngeal erythema.  Eyes:     General: No scleral icterus.    Extraocular Movements: Extraocular movements intact.     Conjunctiva/sclera: Conjunctivae normal.     Pupils: Pupils are equal, round, and reactive to light.  Cardiovascular:     Rate and Rhythm: Normal rate and regular rhythm.     Heart sounds: Normal heart sounds. No murmur heard.    No friction rub. No gallop.  Pulmonary:     Effort: Pulmonary effort is normal.     Breath sounds: Normal breath sounds. No wheezing, rhonchi or rales.  Abdominal:     General: Bowel sounds are normal. There is no distension.     Palpations: Abdomen is soft. There is no hepatomegaly, splenomegaly or mass.     Tenderness: There is no abdominal tenderness.  Musculoskeletal:        General: Normal range of motion.     Cervical back: Normal range of motion and neck supple. No tenderness.     Right lower leg: No edema.     Left lower leg: No edema.  Lymphadenopathy:     Cervical: No cervical adenopathy.     Upper Body:     Right upper body: No supraclavicular or axillary adenopathy.     Left upper body: No  supraclavicular or axillary adenopathy.  Lower Body: No right inguinal adenopathy. No left inguinal adenopathy.  Skin:    General: Skin is warm and dry.     Coloration: Skin is not jaundiced.     Findings: Rash (Generalized, punctate erythematous rash of the thorax and abdomen) present.  Neurological:     Mental Status: He is alert and oriented to person, place, and time.     Cranial Nerves: No cranial nerve deficit.  Psychiatric:        Mood and Affect: Mood normal.        Behavior: Behavior normal.        Thought Content: Thought content normal.     LABS:      Latest Ref Rng & Units 03/02/2022   12:00 AM 02/17/2022   12:00 AM 02/03/2022   12:00 AM  CBC  WBC  3.4     4.8     5.6      Hemoglobin 13.5 - 17.5 12.7     12.6     12.1      Hematocrit 41 - 53 39     38     37      Platelets 150 - 400 K/uL 222     316     254         This result is from an external source.      Latest Ref Rng & Units 03/02/2022   12:00 AM 02/17/2022   12:00 AM 02/03/2022   12:00 AM  CMP  BUN 4 - _0 Creatinine 0.6 - 1.3 1.1     1.0     1.0      Sodium 137 - 147 140     140     139      Potassium 3.5 - 5.1 mEq/L 4.0     4.0     4.2      Chloride 99 - 108 106     105     106      CO2 13 - _1 Calcium 8.7 - 10.7 9.2     9.2     9.2      Alkaline Phos 25 - 125 74     86     103      AST 14 - 40 33     33     32      ALT 10 - 40 U/L 22     23     34         This result is from an external source.     Lab Results  Component Value Date   CEA1 0.8 01/06/2022   /  CEA  Date Value Ref Range Status  01/06/2022 0.8 0.0 - 4.7 ng/mL Final    Comment:    (NOTE)                             Nonsmokers          <3.9                             Smokers             <5.6 Roche Software engineer Immunoassay (  ECLIA) Values obtained with different assay methods or kits cannot be used interchangeably.  Results cannot be interpreted as  absolute evidence of the presence or absence of malignant disease. Performed At: [facility] [place], [place] [id number] Rush Farmer MD [id number]    No results found for: "PSA1" No results found for: "CAN199" No results found for: "CAN125"  No results found for: "TOTALPROTELP", "ALBUMINELP", "A1GS", "A2GS", "BETS", "BETA2SER", "GAMS", "MSPIKE", "SPEI" Lab Results  Component Value Date   TIBC 225 (L) 11/22/2021   FERRITIN 292 11/22/2021   IRONPCTSAT 6 (L) 11/22/2021   No results found for: "LDH"  STUDIES:  No results found.    HISTORY:   Past Medical History:  Diagnosis Date   GERD (gastroesophageal reflux disease)    Renal lithiasis     Past Surgical History:  Procedure Laterality Date   LIVER RESECTION Right 07/02/2019   LIVER RESECTION Right 10/12/2021   SIGMOIDECTOMY  03/2016    Family History  Problem Relation Age of Onset   Lung cancer Father 81   Leukemia Father 22       acute myelogenous   Colon cancer Sister 41    Social History:  reports that he has quit smoking. He has never used smokeless tobacco. He reports that he does not currently use alcohol. No history on file for drug use.The patient is accompanied by his wife today.  Allergies: No Known Allergies  Current Medications: Current Outpatient Medications  Medication Sig Dispense Refill   ascorbic acid (VITAMIN C) 1000 MG tablet Take by mouth.     benzonatate (TESSALON) 200 MG capsule Take 1 capsule (200 mg total) by mouth 3 (three) times daily as needed for cough. (Patient not taking: Reported on 03/02/2022) 20 capsule 1   Calcium Polycarbophil (FIBER) 625 MG TABS Take by mouth.     cetirizine (ZYRTEC) 10 MG tablet Take by mouth.     Cholecalciferol (VITAMIN D) 50 MCG (2000 UT) tablet Take by mouth.     Docusate Sodium (DSS) 100 MG CAPS Take 1 capsule by mouth daily.     ferrous sulfate 325 (65 FE) MG tablet Take 1 capsule by mouth every morning.      hydrocortisone (ANUSOL-HC) 2.5 % rectal cream Place 1 Application rectally 2 (two) times daily. 30 g 0   loperamide (IMODIUM) 2 MG capsule Take 1 capsule (2 mg total) by mouth as needed for diarrhea or loose stools. Take 2 at diarrhea onset , then 1 every 2hr until 12hrs with no BM. May take 2 every 4hrs at night. If diarrhea recurs repeat. 100 capsule 5   LORazepam (ATIVAN) 1 MG tablet Take 1 tablet (1 mg total) by mouth every 6 (six) hours as needed for anxiety or sleep. 100 tablet 0   Multiple Vitamin (MULTI-VITAMIN) tablet Take 1 tablet by mouth daily.     omeprazole (PRILOSEC) 20 MG capsule Take by mouth.     ondansetron (ZOFRAN) 8 MG tablet Take 1 tablet (8 mg total) by mouth 2 (two) times daily as needed for refractory nausea / vomiting. Start on day 3 after chemotherapy. 30 tablet 1   ondansetron (ZOFRAN-ODT) 4 MG disintegrating tablet Take 4 mg by mouth every 8 (eight) hours as needed. (Patient not taking: Reported on 03/02/2022)     oxyCODONE (OXY IR/ROXICODONE) 5 MG immediate release tablet Take by mouth.     Probiotic Product (PROBIOTIC BLEND PO) Take 1 tablet by mouth daily.     prochlorperazine (COMPAZINE) 10 MG  tablet Take 1 tablet (10 mg total) by mouth every 6 (six) hours as needed (NAUSEA). (Patient not taking: Reported on 03/02/2022) 30 tablet 1   vitamin B-12 (CYANOCOBALAMIN) 250 MCG tablet Take by mouth.     zinc gluconate 50 MG tablet Take 50 mg by mouth daily.     No current facility-administered medications for this visit.   Facility-Administered Medications Ordered in Other Visits  Medication Dose Route Frequency Provider Last Rate Last Admin   atropine 1 MG/ML injection            palonosetron (ALOXI) 0.25 MG/5ML injection

## 2022-03-02 NOTE — Assessment & Plan Note (Signed)
Microcytic anemia, which is stable.  He denies any overt form of blood loss.  Iron studies were equivocal in July.  He was placed on ferrous sulfate daily with improvement in his hemoglobin.  He is on ferrous sulfate daily and his hemoglobin continues to improve, so we will have him continue oral iron for now.

## 2022-03-02 NOTE — Assessment & Plan Note (Signed)
History of liver metastasis in January 2021 treated with resection alone.  He had recurrent liver metastasis treated with a partial hepatectomy in June.  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  There was 1 negative node but margins were close.  This was attached to the diaphragm but the tissue from the diaphragm is benign.  He had a complicated postoperative course developing an cyst requiring drainage.  He then developed a right pleural effusion in July treated with a VATS procedure.  He is receiving chemotherapy with FOLFIRI and Avastin, which he started in August.  His third cycle of chemotherapy was delayed as he was admitted with sepsis.  He completed IV antibiotics as an outpatient.  CT abdomen and pelvis in the ER in September did not reveal any obvious evidence of recurrence.  There was a 12 mm porta hepatis lymph node, attention on follow-up was recommended.  We will proceed with a 6th cycle of chemotherapy next week.  We will plan to see him back in 2 weeks for repeat clinical assessment prior to a 7th cycle.

## 2022-03-06 MED FILL — Bevacizumab-awwb IV Soln 400 MG/16ML (For Infusion): INTRAVENOUS | Qty: 24 | Status: AC

## 2022-03-06 MED FILL — Dexamethasone Sodium Phosphate Inj 100 MG/10ML: INTRAMUSCULAR | Qty: 1 | Status: AC

## 2022-03-06 MED FILL — Fluorouracil IV Soln 5 GM/100ML (50 MG/ML): INTRAVENOUS | Qty: 122 | Status: AC

## 2022-03-06 MED FILL — Irinotecan HCl Inj 100 MG/5ML (20 MG/ML): INTRAVENOUS | Qty: 23 | Status: AC

## 2022-03-06 MED FILL — Leucovorin Calcium For Inj 350 MG: INTRAMUSCULAR | Qty: 51.4 | Status: AC

## 2022-03-06 MED FILL — Fluorouracil IV Soln 2.5 GM/50ML (50 MG/ML): INTRAVENOUS | Qty: 20 | Status: AC

## 2022-03-07 ENCOUNTER — Inpatient Hospital Stay: Payer: BC Managed Care – PPO

## 2022-03-07 VITALS — BP 167/79 | HR 88 | Temp 98.0°F | Resp 18 | Ht 78.0 in | Wt 270.0 lb

## 2022-03-07 DIAGNOSIS — Z5112 Encounter for antineoplastic immunotherapy: Secondary | ICD-10-CM | POA: Diagnosis not present

## 2022-03-07 DIAGNOSIS — Z79899 Other long term (current) drug therapy: Secondary | ICD-10-CM | POA: Diagnosis not present

## 2022-03-07 DIAGNOSIS — Z87891 Personal history of nicotine dependence: Secondary | ICD-10-CM | POA: Diagnosis not present

## 2022-03-07 DIAGNOSIS — C787 Secondary malignant neoplasm of liver and intrahepatic bile duct: Secondary | ICD-10-CM | POA: Diagnosis not present

## 2022-03-07 DIAGNOSIS — Z5111 Encounter for antineoplastic chemotherapy: Secondary | ICD-10-CM | POA: Diagnosis not present

## 2022-03-07 DIAGNOSIS — Z23 Encounter for immunization: Secondary | ICD-10-CM | POA: Diagnosis not present

## 2022-03-07 DIAGNOSIS — C187 Malignant neoplasm of sigmoid colon: Secondary | ICD-10-CM | POA: Diagnosis not present

## 2022-03-07 DIAGNOSIS — D500 Iron deficiency anemia secondary to blood loss (chronic): Secondary | ICD-10-CM | POA: Diagnosis not present

## 2022-03-07 DIAGNOSIS — R21 Rash and other nonspecific skin eruption: Secondary | ICD-10-CM | POA: Diagnosis not present

## 2022-03-07 DIAGNOSIS — C189 Malignant neoplasm of colon, unspecified: Secondary | ICD-10-CM | POA: Diagnosis not present

## 2022-03-07 MED ORDER — SODIUM CHLORIDE 0.9 % IV SOLN
5.0000 mg/kg | Freq: Once | INTRAVENOUS | Status: AC
  Administered 2022-03-07: 600 mg via INTRAVENOUS
  Filled 2022-03-07: qty 16

## 2022-03-07 MED ORDER — IRINOTECAN HCL CHEMO INJECTION 100 MG/5ML
180.0000 mg/m2 | Freq: Once | INTRAVENOUS | Status: AC
  Administered 2022-03-07: 460 mg via INTRAVENOUS
  Filled 2022-03-07: qty 15

## 2022-03-07 MED ORDER — SODIUM CHLORIDE 0.9 % IV SOLN
10.0000 mg | Freq: Once | INTRAVENOUS | Status: AC
  Administered 2022-03-07: 10 mg via INTRAVENOUS
  Filled 2022-03-07: qty 10

## 2022-03-07 MED ORDER — FLUOROURACIL CHEMO INJECTION 2.5 GM/50ML
398.0000 mg/m2 | Freq: Once | INTRAVENOUS | Status: AC
  Administered 2022-03-07: 1000 mg via INTRAVENOUS
  Filled 2022-03-07: qty 20

## 2022-03-07 MED ORDER — ATROPINE SULFATE 1 MG/ML IV SOLN
0.5000 mg | Freq: Once | INTRAVENOUS | Status: AC | PRN
  Administered 2022-03-07: 0.5 mg via INTRAVENOUS
  Filled 2022-03-07: qty 1

## 2022-03-07 MED ORDER — SODIUM CHLORIDE 0.9 % IV SOLN: Freq: Once | INTRAVENOUS | Status: AC

## 2022-03-07 MED ORDER — PALONOSETRON HCL INJECTION 0.25 MG/5ML
0.2500 mg | Freq: Once | INTRAVENOUS | Status: AC
  Administered 2022-03-07: 0.25 mg via INTRAVENOUS
  Filled 2022-03-07: qty 5

## 2022-03-07 MED ORDER — HEPARIN SOD (PORK) LOCK FLUSH 100 UNIT/ML IV SOLN: 500.0000 [IU] | Freq: Once | INTRAVENOUS | Status: DC | PRN

## 2022-03-07 MED ORDER — SODIUM CHLORIDE 0.9 % IV SOLN
2375.0000 mg/m2 | INTRAVENOUS | Status: DC
  Administered 2022-03-07: 6100 mg via INTRAVENOUS
  Filled 2022-03-07: qty 122

## 2022-03-07 MED ORDER — SODIUM CHLORIDE 0.9% FLUSH: 10.0000 mL | INTRAVENOUS | Status: DC | PRN

## 2022-03-07 MED ORDER — LEUCOVORIN CALCIUM INJECTION 350 MG
400.0000 mg/m2 | Freq: Once | INTRAVENOUS | Status: AC
  Administered 2022-03-07: 1028 mg via INTRAVENOUS
  Filled 2022-03-07: qty 51.4

## 2022-03-07 NOTE — Patient Instructions (Signed)
Fluorouracil Injection What is this medication? FLUOROURACIL (flure oh YOOR a sil) treats some types of cancer. It works by slowing down the growth of cancer cells. This medicine may be used for other purposes; ask your health care provider or pharmacist if you have questions. COMMON BRAND NAME(S): Adrucil What should I tell my care team before I take this medication? They need to know if you have any of these conditions: Blood disorders Dihydropyrimidine dehydrogenase (DPD) deficiency Infection, such as chickenpox, cold sores, herpes Kidney disease Liver disease Poor nutrition Recent or ongoing radiation therapy An unusual or allergic reaction to fluorouracil, other medications, foods, dyes, or preservatives If you or your partner are pregnant or trying to get pregnant Breast-feeding How should I use this medication? This medication is injected into a vein. It is administered by your care team in a hospital or clinic setting. Talk to your care team about the use of this medication in children. Special care may be needed. Overdosage: If you think you have taken too much of this medicine contact a poison control center or emergency room at once. NOTE: This medicine is only for you. Do not share this medicine with others. What if I miss a dose? Keep appointments for follow-up doses. It is important not to miss your dose. Call your care team if you are unable to keep an appointment. What may interact with this medication? Do not take this medication with any of the following: Live virus vaccines This medication may also interact with the following: Medications that treat or prevent blood clots, such as warfarin, enoxaparin, dalteparin This list may not describe all possible interactions. Give your health care provider a list of all the medicines, herbs, non-prescription drugs, or dietary supplements you use. Also tell them if you smoke, drink alcohol, or use illegal drugs. Some items may  interact with your medicine. What should I watch for while using this medication? Your condition will be monitored carefully while you are receiving this medication. This medication may make you feel generally unwell. This is not uncommon as chemotherapy can affect healthy cells as well as cancer cells. Report any side effects. Continue your course of treatment even though you feel ill unless your care team tells you to stop. In some cases, you may be given additional medications to help with side effects. Follow all directions for their use. This medication may increase your risk of getting an infection. Call your care team for advice if you get a fever, chills, sore throat, or other symptoms of a cold or flu. Do not treat yourself. Try to avoid being around people who are sick. This medication may increase your risk to bruise or bleed. Call your care team if you notice any unusual bleeding. Be careful brushing or flossing your teeth or using a toothpick because you may get an infection or bleed more easily. If you have any dental work done, tell your dentist you are receiving this medication. Avoid taking medications that contain aspirin, acetaminophen, ibuprofen, naproxen, or ketoprofen unless instructed by your care team. These medications may hide a fever. Do not treat diarrhea with over the counter products. Contact your care team if you have diarrhea that lasts more than 2 days or if it is severe and watery. This medication can make you more sensitive to the sun. Keep out of the sun. If you cannot avoid being in the sun, wear protective clothing and sunscreen. Do not use sun lamps, tanning beds, or tanning booths. Talk to   your care team if you or your partner wish to become pregnant or think you might be pregnant. This medication can cause serious birth defects if taken during pregnancy and for 3 months after the last dose. A reliable form of contraception is recommended while taking this  medication and for 3 months after the last dose. Talk to your care team about effective forms of contraception. Do not father a child while taking this medication and for 3 months after the last dose. Use a condom while having sex during this time period. Do not breastfeed while taking this medication. This medication may cause infertility. Talk to your care team if you are concerned about your fertility. What side effects may I notice from receiving this medication? Side effects that you should report to your care team as soon as possible: Allergic reactions--skin rash, itching, hives, swelling of the face, lips, tongue, or throat Heart attack--pain or tightness in the chest, shoulders, arms, or jaw, nausea, shortness of breath, cold or clammy skin, feeling faint or lightheaded Heart failure--shortness of breath, swelling of the ankles, feet, or hands, sudden weight gain, unusual weakness or fatigue Heart rhythm changes--fast or irregular heartbeat, dizziness, feeling faint or lightheaded, chest pain, trouble breathing High ammonia level--unusual weakness or fatigue, confusion, loss of appetite, nausea, vomiting, seizures Infection--fever, chills, cough, sore throat, wounds that don't heal, pain or trouble when passing urine, general feeling of discomfort or being unwell Low red blood cell level--unusual weakness or fatigue, dizziness, headache, trouble breathing Pain, tingling, or numbness in the hands or feet, muscle weakness, change in vision, confusion or trouble speaking, loss of balance or coordination, trouble walking, seizures Redness, swelling, and blistering of the skin over hands and feet Severe or prolonged diarrhea Unusual bruising or bleeding Side effects that usually do not require medical attention (report to your care team if they continue or are bothersome): Dry skin Headache Increased tears Nausea Pain, redness, or swelling with sores inside the mouth or throat Sensitivity  to light Vomiting This list may not describe all possible side effects. Call your doctor for medical advice about side effects. You may report side effects to FDA at 1-800-FDA-1088. Where should I keep my medication? This medication is given in a hospital or clinic. It will not be stored at home. NOTE: This sheet is a summary. It may not cover all possible information. If you have questions about this medicine, talk to your doctor, pharmacist, or health care provider.  2023 [employer] Standard (2021-08-23 00:00:00) Leucovorin Injection What is this medication? LEUCOVORIN (loo koe VOR in) prevents side effects from certain medications, such as methotrexate. It works by increasing folate levels. This helps protect healthy cells in your body. It may also be used to treat anemia caused by low levels of folate. It can also be used with fluorouracil, a type of chemotherapy, to treat colorectal cancer. It works by increasing the effects of fluorouracil in the body. This medicine may be used for other purposes; ask your health care provider or pharmacist if you have questions. What should I tell my care team before I take this medication? They need to know if you have any of these conditions: Anemia from low levels of vitamin B12 in the blood An unusual or allergic reaction to leucovorin, folic acid, other medications, foods, dyes, or preservatives Pregnant or trying to get pregnant Breastfeeding How should I use this medication? This medication is injected into a vein or a muscle. It is given by your care team  in a hospital or clinic setting. Talk to your care team about the use of this medication in children. Special care may be needed. Overdosage: If you think you have taken too much of this medicine contact a poison control center or emergency room at once. NOTE: This medicine is only for you. Do not share this medicine with others. What if I miss a dose? Keep appointments for follow-up doses.  It is important not to miss your dose. Call your care team if you are unable to keep an appointment. What may interact with this medication? Capecitabine Fluorouracil Phenobarbital Phenytoin Primidone Trimethoprim;sulfamethoxazole This list may not describe all possible interactions. Give your health care provider a list of all the medicines, herbs, non-prescription drugs, or dietary supplements you use. Also tell them if you smoke, drink alcohol, or use illegal drugs. Some items may interact with your medicine. What should I watch for while using this medication? Your condition will be monitored carefully while you are receiving this medication. This medication may increase the side effects of 5-fluorouracil. Tell your care team if you have diarrhea or mouth sores that do not get better or that get worse. What side effects may I notice from receiving this medication? Side effects that you should report to your care team as soon as possible: Allergic reactions--skin rash, itching, hives, swelling of the face, lips, tongue, or throat This list may not describe all possible side effects. Call your doctor for medical advice about side effects. You may report side effects to FDA at 1-800-FDA-1088. Where should I keep my medication? This medication is given in a hospital or clinic. It will not be stored at home. NOTE: This sheet is a summary. It may not cover all possible information. If you have questions about this medicine, talk to your doctor, pharmacist, or health care provider.  2023 [employer] Standard (2021-09-27 00:00:00) Irinotecan Injection What is this medication? IRINOTECAN (ir in oh TEE kan) treats some types of cancer. It works by slowing down the growth of cancer cells. This medicine may be used for other purposes; ask your health care provider or pharmacist if you have questions. COMMON BRAND NAME(S): Camptosar What should I tell my care team before I take this  medication? They need to know if you have any of these conditions: Dehydration Diarrhea Infection, especially a viral infection, such as chickenpox, cold sores, herpes Liver disease Low blood cell levels (white cells, red cells, and platelets) Low levels of electrolytes, such as calcium, magnesium, or potassium in your blood Recent or ongoing radiation An unusual or allergic reaction to irinotecan, other medications, foods, dyes, or preservatives If you or your partner are pregnant or trying to get pregnant Breast-feeding How should I use this medication? This medication is injected into a vein. It is given by your care team in a hospital or clinic setting. Talk to your care team about the use of this medication in children. Special care may be needed. Overdosage: If you think you have taken too much of this medicine contact a poison control center or emergency room at once. NOTE: This medicine is only for you. Do not share this medicine with others. What if I miss a dose? Keep appointments for follow-up doses. It is important not to miss your dose. Call your care team if you are unable to keep an appointment. What may interact with this medication? Do not take this medication with any of the following: Cobicistat Itraconazole This medication may also interact with the following:  Certain antibiotics, such as clarithromycin, rifampin, rifabutin Certain antivirals for HIV or AIDS Certain medications for fungal infections, such as ketoconazole, posaconazole, voriconazole Certain medications for seizures, such as carbamazepine, phenobarbital, phenytoin Gemfibrozil Nefazodone St. John's wort This list may not describe all possible interactions. Give your health care provider a list of all the medicines, herbs, non-prescription drugs, or dietary supplements you use. Also tell them if you smoke, drink alcohol, or use illegal drugs. Some items may interact with your medicine. What should I  watch for while using this medication? Your condition will be monitored carefully while you are receiving this medication. You may need blood work while taking this medication. This medication may make you feel generally unwell. This is not uncommon as chemotherapy can affect healthy cells as well as cancer cells. Report any side effects. Continue your course of treatment even though you feel ill unless your care team tells you to stop. This medication can cause serious side effects. To reduce the risk, your care team may give you other medications to take before receiving this one. Be sure to follow the directions from your care team. This medication may affect your coordination, reaction time, or judgement. Do not drive or operate machinery until you know how this medication affects you. Sit up or stand slowly to reduce the risk of dizzy or fainting spells. Drinking alcohol with this medication can increase the risk of these side effects. This medication may increase your risk of getting an infection. Call your care team for advice if you get a fever, chills, sore throat, or other symptoms of a cold or flu. Do not treat yourself. Try to avoid being around people who are sick. Avoid taking medications that contain aspirin, acetaminophen, ibuprofen, naproxen, or ketoprofen unless instructed by your care team. These medications may hide a fever. This medication may increase your risk to bruise or bleed. Call your care team if you notice any unusual bleeding. Be careful brushing or flossing your teeth or using a toothpick because you may get an infection or bleed more easily. If you have any dental work done, tell your dentist you are receiving this medication. Talk to your care team if you or your partner are pregnant or think either of you might be pregnant. This medication can cause serious birth defects if taken during pregnancy and for 6 months after the last dose. You will need a negative pregnancy test  before starting this medication. Contraception is recommended while taking this medication and for 6 months after the last dose. Your care team can help you find the option that works for you. Do not father a child while taking this medication and for 3 months after the last dose. Use a condom for contraception during this time period. Do not breastfeed while taking this medication and for 7 days after the last dose. This medication may cause infertility. Talk to your care team if you are concerned about your fertility. What side effects may I notice from receiving this medication? Side effects that you should report to your care team as soon as possible: Allergic reactions--skin rash, itching, hives, swelling of the face, lips, tongue, or throat Dry cough, shortness of breath or trouble breathing Increased saliva or tears, increased sweating, stomach cramping, diarrhea, small pupils, unusual weakness or fatigue, slow heartbeat Infection--fever, chills, cough, sore throat, wounds that don't heal, pain or trouble when passing urine, general feeling of discomfort or being unwell Kidney injury--decrease in the amount of urine, swelling of the  ankles, hands, or feet Low red blood cell level--unusual weakness or fatigue, dizziness, headache, trouble breathing Severe or prolonged diarrhea Unusual bruising or bleeding Side effects that usually do not require medical attention (report to your care team if they continue or are bothersome): Constipation Diarrhea Hair loss Loss of appetite Nausea Stomach pain This list may not describe all possible side effects. Call your doctor for medical advice about side effects. You may report side effects to FDA at 1-800-FDA-1088. Where should I keep my medication? This medication is given in a hospital or clinic. It will not be stored at home. NOTE: This sheet is a summary. It may not cover all possible information. If you have questions about this medicine, talk  to your doctor, pharmacist, or health care provider.  2023 [employer]/Gold Standard (2021-09-01 00:00:00) Bevacizumab Injection What is this medication? BEVACIZUMAB (be va SIZ yoo mab) treats some types of cancer. It works by blocking a protein that causes cancer cells to grow and multiply. This helps to slow or stop the spread of cancer cells. It is a monoclonal antibody. This medicine may be used for other purposes; ask your health care provider or pharmacist if you have questions. COMMON BRAND NAME(S): Alymsys, Avastin, MVASI, Noah Charon What should I tell my care team before I take this medication? They need to know if you have any of these conditions: Blood clots Coughing up blood Having or recent surgery Heart failure High blood pressure History of a connection between 2 or more body parts that do not usually connect (fistula) History of a tear in your stomach or intestines Protein in your urine An unusual or allergic reaction to bevacizumab, other medications, foods, dyes, or preservatives Pregnant or trying to get pregnant Breast-feeding How should I use this medication? This medication is injected into a vein. It is given by your care team in a hospital or clinic setting. Talk to your care team the use of this medication in children. Special care may be needed. Overdosage: If you think you have taken too much of this medicine contact a poison control center or emergency room at once. NOTE: This medicine is only for you. Do not share this medicine with others. What if I miss a dose? Keep appointments for follow-up doses. It is important not to miss your dose. Call your care team if you are unable to keep an appointment. What may interact with this medication? Interactions are not expected. This list may not describe all possible interactions. Give your health care provider a list of all the medicines, herbs, non-prescription drugs, or dietary supplements you use. Also tell them if  you smoke, drink alcohol, or use illegal drugs. Some items may interact with your medicine. What should I watch for while using this medication? Your condition will be monitored carefully while you are receiving this medication. You may need blood work while taking this medication. This medication may make you feel generally unwell. This is not uncommon as chemotherapy can affect healthy cells as well as cancer cells. Report any side effects. Continue your course of treatment even though you feel ill unless your care team tells you to stop. This medication may increase your risk to bruise or bleed. Call your care team if you notice any unusual bleeding. Before having surgery, talk to your care team to make sure it is ok. This medication can increase the risk of poor healing of your surgical site or wound. You will need to stop this medication for 28 days before  surgery. After surgery, wait at least 28 days before restarting this medication. Make sure the surgical site or wound is healed enough before restarting this medication. Talk to your care team if questions. Talk to your care team if you may be pregnant. Serious birth defects can occur if you take this medication during pregnancy and for 6 months after the last dose. Contraception is recommended while taking this medication and for 6 months after the last dose. Your care team can help you find the option that works for you. Do not breastfeed while taking this medication and for 6 months after the last dose. This medication can cause infertility. Talk to your care team if you are concerned about your fertility. What side effects may I notice from receiving this medication? Side effects that you should report to your care team as soon as possible: Allergic reactions--skin rash, itching, hives, swelling of the face, lips, tongue, or throat Bleeding--bloody or black, tar-like stools, vomiting blood or brown material that looks like coffee grounds, red  or dark brown urine, small red or purple spots on skin, unusual bruising or bleeding Blood clot--pain, swelling, or warmth in the leg, shortness of breath, chest pain Heart attack--pain or tightness in the chest, shoulders, arms, or jaw, nausea, shortness of breath, cold or clammy skin, feeling faint or lightheaded Heart failure--shortness of breath, swelling of the ankles, feet, or hands, sudden weight gain, unusual weakness or fatigue Increase in blood pressure Infection--fever, chills, cough, sore throat, wounds that don't heal, pain or trouble when passing urine, general feeling of discomfort or being unwell Infusion reactions--chest pain, shortness of breath or trouble breathing, feeling faint or lightheaded Kidney injury--decrease in the amount of urine, swelling of the ankles, hands, or feet Stomach pain that is severe, does not go away, or gets worse Stroke--sudden numbness or weakness of the face, arm, or leg, trouble speaking, confusion, trouble walking, loss of balance or coordination, dizziness, severe headache, change in vision Sudden and severe headache, confusion, change in vision, seizures, which may be signs of posterior reversible encephalopathy syndrome (PRES) Side effects that usually do not require medical attention (report to your care team if they continue or are bothersome): Back pain Change in taste Diarrhea Dry skin Increased tears Nosebleed This list may not describe all possible side effects. Call your doctor for medical advice about side effects. You may report side effects to FDA at 1-800-FDA-1088. Where should I keep my medication? This medication is given in a hospital or clinic. It will not be stored at home. NOTE: This sheet is a summary. It may not cover all possible information. If you have questions about this medicine, talk to your doctor, pharmacist, or health care provider.  2023 [employer] Standard (2021-08-26 00:00:00)

## 2022-03-09 ENCOUNTER — Inpatient Hospital Stay: Payer: BC Managed Care – PPO | Attending: Oncology

## 2022-03-09 VITALS — BP 171/93 | HR 86 | Temp 98.0°F | Resp 18 | Ht 78.0 in | Wt 270.0 lb

## 2022-03-09 DIAGNOSIS — C187 Malignant neoplasm of sigmoid colon: Secondary | ICD-10-CM | POA: Diagnosis not present

## 2022-03-09 DIAGNOSIS — C787 Secondary malignant neoplasm of liver and intrahepatic bile duct: Secondary | ICD-10-CM | POA: Insufficient documentation

## 2022-03-09 DIAGNOSIS — Z5111 Encounter for antineoplastic chemotherapy: Secondary | ICD-10-CM | POA: Insufficient documentation

## 2022-03-09 DIAGNOSIS — D500 Iron deficiency anemia secondary to blood loss (chronic): Secondary | ICD-10-CM | POA: Diagnosis not present

## 2022-03-09 DIAGNOSIS — J90 Pleural effusion, not elsewhere classified: Secondary | ICD-10-CM | POA: Insufficient documentation

## 2022-03-09 DIAGNOSIS — Z5112 Encounter for antineoplastic immunotherapy: Secondary | ICD-10-CM | POA: Insufficient documentation

## 2022-03-09 DIAGNOSIS — Z452 Encounter for adjustment and management of vascular access device: Secondary | ICD-10-CM | POA: Diagnosis not present

## 2022-03-09 MED ORDER — SODIUM CHLORIDE 0.9% FLUSH
10.0000 mL | INTRAVENOUS | Status: DC | PRN
  Administered 2022-03-09: 10 mL

## 2022-03-09 MED ORDER — HEPARIN SOD (PORK) LOCK FLUSH 100 UNIT/ML IV SOLN
500.0000 [IU] | Freq: Once | INTRAVENOUS | Status: AC | PRN
  Administered 2022-03-09: 500 [IU]

## 2022-03-09 NOTE — Patient Instructions (Signed)
Fluorouracil Injection What is this medication? FLUOROURACIL (flure oh YOOR a sil) treats some types of cancer. It works by slowing down the growth of cancer cells. This medicine may be used for other purposes; ask your health care provider or pharmacist if you have questions. COMMON BRAND NAME(S): Adrucil What should I tell my care team before I take this medication? They need to know if you have any of these conditions: Blood disorders Dihydropyrimidine dehydrogenase (DPD) deficiency Infection, such as chickenpox, cold sores, herpes Kidney disease Liver disease Poor nutrition Recent or ongoing radiation therapy An unusual or allergic reaction to fluorouracil, other medications, foods, dyes, or preservatives If you or your partner are pregnant or trying to get pregnant Breast-feeding How should I use this medication? This medication is injected into a vein. It is administered by your care team in a hospital or clinic setting. Talk to your care team about the use of this medication in children. Special care may be needed. Overdosage: If you think you have taken too much of this medicine contact a poison control center or emergency room at once. NOTE: This medicine is only for you. Do not share this medicine with others. What if I miss a dose? Keep appointments for follow-up doses. It is important not to miss your dose. Call your care team if you are unable to keep an appointment. What may interact with this medication? Do not take this medication with any of the following: Live virus vaccines This medication may also interact with the following: Medications that treat or prevent blood clots, such as warfarin, enoxaparin, dalteparin This list may not describe all possible interactions. Give your health care provider a list of all the medicines, herbs, non-prescription drugs, or dietary supplements you use. Also tell them if you smoke, drink alcohol, or use illegal drugs. Some items may  interact with your medicine. What should I watch for while using this medication? Your condition will be monitored carefully while you are receiving this medication. This medication may make you feel generally unwell. This is not uncommon as chemotherapy can affect healthy cells as well as cancer cells. Report any side effects. Continue your course of treatment even though you feel ill unless your care team tells you to stop. In some cases, you may be given additional medications to help with side effects. Follow all directions for their use. This medication may increase your risk of getting an infection. Call your care team for advice if you get a fever, chills, sore throat, or other symptoms of a cold or flu. Do not treat yourself. Try to avoid being around people who are sick. This medication may increase your risk to bruise or bleed. Call your care team if you notice any unusual bleeding. Be careful brushing or flossing your teeth or using a toothpick because you may get an infection or bleed more easily. If you have any dental work done, tell your dentist you are receiving this medication. Avoid taking medications that contain aspirin, acetaminophen, ibuprofen, naproxen, or ketoprofen unless instructed by your care team. These medications may hide a fever. Do not treat diarrhea with over the counter products. Contact your care team if you have diarrhea that lasts more than 2 days or if it is severe and watery. This medication can make you more sensitive to the sun. Keep out of the sun. If you cannot avoid being in the sun, wear protective clothing and sunscreen. Do not use sun lamps, tanning beds, or tanning booths. Talk to   your care team if you or your partner wish to become pregnant or think you might be pregnant. This medication can cause serious birth defects if taken during pregnancy and for 3 months after the last dose. A reliable form of contraception is recommended while taking this  medication and for 3 months after the last dose. Talk to your care team about effective forms of contraception. Do not father a child while taking this medication and for 3 months after the last dose. Use a condom while having sex during this time period. Do not breastfeed while taking this medication. This medication may cause infertility. Talk to your care team if you are concerned about your fertility. What side effects may I notice from receiving this medication? Side effects that you should report to your care team as soon as possible: Allergic reactions--skin rash, itching, hives, swelling of the face, lips, tongue, or throat Heart attack--pain or tightness in the chest, shoulders, arms, or jaw, nausea, shortness of breath, cold or clammy skin, feeling faint or lightheaded Heart failure--shortness of breath, swelling of the ankles, feet, or hands, sudden weight gain, unusual weakness or fatigue Heart rhythm changes--fast or irregular heartbeat, dizziness, feeling faint or lightheaded, chest pain, trouble breathing High ammonia level--unusual weakness or fatigue, confusion, loss of appetite, nausea, vomiting, seizures Infection--fever, chills, cough, sore throat, wounds that don't heal, pain or trouble when passing urine, general feeling of discomfort or being unwell Low red blood cell level--unusual weakness or fatigue, dizziness, headache, trouble breathing Pain, tingling, or numbness in the hands or feet, muscle weakness, change in vision, confusion or trouble speaking, loss of balance or coordination, trouble walking, seizures Redness, swelling, and blistering of the skin over hands and feet Severe or prolonged diarrhea Unusual bruising or bleeding Side effects that usually do not require medical attention (report to your care team if they continue or are bothersome): Dry skin Headache Increased tears Nausea Pain, redness, or swelling with sores inside the mouth or throat Sensitivity  to light Vomiting This list may not describe all possible side effects. Call your doctor for medical advice about side effects. You may report side effects to FDA at 1-800-FDA-1088. Where should I keep my medication? This medication is given in a hospital or clinic. It will not be stored at home. NOTE: This sheet is a summary. It may not cover all possible information. If you have questions about this medicine, talk to your doctor, pharmacist, or health care provider.  2023 [employer] (2021-08-23 00:00:00)  

## 2022-03-13 ENCOUNTER — Encounter: Payer: Self-pay | Admitting: Oncology

## 2022-03-14 ENCOUNTER — Encounter: Payer: Self-pay | Admitting: Oncology

## 2022-03-15 DIAGNOSIS — C189 Malignant neoplasm of colon, unspecified: Secondary | ICD-10-CM | POA: Diagnosis not present

## 2022-03-15 DIAGNOSIS — I10 Essential (primary) hypertension: Secondary | ICD-10-CM | POA: Insufficient documentation

## 2022-03-17 ENCOUNTER — Encounter: Payer: Self-pay | Admitting: Oncology

## 2022-03-17 ENCOUNTER — Inpatient Hospital Stay (HOSPITAL_BASED_OUTPATIENT_CLINIC_OR_DEPARTMENT_OTHER): Payer: BC Managed Care – PPO | Admitting: Oncology

## 2022-03-17 ENCOUNTER — Inpatient Hospital Stay: Payer: BC Managed Care – PPO

## 2022-03-17 VITALS — BP 154/108 | HR 71 | Temp 97.8°F | Resp 20 | Ht 78.0 in | Wt 269.3 lb

## 2022-03-17 DIAGNOSIS — Z452 Encounter for adjustment and management of vascular access device: Secondary | ICD-10-CM | POA: Diagnosis not present

## 2022-03-17 DIAGNOSIS — C187 Malignant neoplasm of sigmoid colon: Secondary | ICD-10-CM | POA: Diagnosis not present

## 2022-03-17 DIAGNOSIS — D500 Iron deficiency anemia secondary to blood loss (chronic): Secondary | ICD-10-CM

## 2022-03-17 DIAGNOSIS — J90 Pleural effusion, not elsewhere classified: Secondary | ICD-10-CM | POA: Diagnosis not present

## 2022-03-17 DIAGNOSIS — Z5111 Encounter for antineoplastic chemotherapy: Secondary | ICD-10-CM | POA: Diagnosis not present

## 2022-03-17 DIAGNOSIS — C787 Secondary malignant neoplasm of liver and intrahepatic bile duct: Secondary | ICD-10-CM

## 2022-03-17 DIAGNOSIS — Z5112 Encounter for antineoplastic immunotherapy: Secondary | ICD-10-CM | POA: Diagnosis not present

## 2022-03-17 LAB — CBC: RBC: 5.23 — AB (ref 3.87–5.11)

## 2022-03-17 LAB — CBC AND DIFFERENTIAL
HCT: 43 (ref 41–53)
Hemoglobin: 14.1 (ref 13.5–17.5)
Neutrophils Absolute: 2.34
Platelets: 220 10*3/uL (ref 150–400)
WBC: 4.5

## 2022-03-17 LAB — TOTAL PROTEIN, URINE DIPSTICK: Protein, ur: NEGATIVE mg/dL

## 2022-03-17 LAB — HEPATIC FUNCTION PANEL
ALT: 20 U/L (ref 10–40)
AST: 32 (ref 14–40)
Alkaline Phosphatase: 80 (ref 25–125)
Bilirubin, Total: 0.7

## 2022-03-17 LAB — BASIC METABOLIC PANEL
BUN: 12 (ref 4–21)
CO2: 21 (ref 13–22)
Chloride: 107 (ref 99–108)
Creatinine: 0.9 (ref 0.6–1.3)
Glucose: 95
Potassium: 4.6 mEq/L (ref 3.5–5.1)
Sodium: 134 — AB (ref 137–147)

## 2022-03-17 LAB — COMPREHENSIVE METABOLIC PANEL
Albumin: 3.9 (ref 3.5–5.0)
Calcium: 9.3 (ref 8.7–10.7)

## 2022-03-17 NOTE — Progress Notes (Signed)
Eduardo Hester  [address] [place],  [place]  [address] [phone]  Clinic Day:  03/17/22  Referring physician: Derwood Kaplan, MD  ASSESSMENT & PLAN:   Assessment & Plan: Malignant neoplasm metastatic to liver [facility]) History of liver metastasis in January 2021 treated with resection alone.  He had recurrent liver metastasis treated with a partial hepatectomy in June.  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  There was 1 negative node but margins were close.  This was attached to the diaphragm but the tissue from the diaphragm is benign.  He had a complicated postoperative course developing an cyst requiring drainage.  He then developed a right pleural effusion in July treated with a VATS procedure. He did have postoperative complications including biliary leak and had ERCP with placement of an endoscopic stent into the biliary duct.  He was readmitted on June 27 with abscess and sepsis and perihepatic fluid collection that required placement of a drain.  He had fevers and has felt very poorly since that time.  He was then found to have pleural effusion on July 1 treated with a right thoracoscopic total pulmonary decortication. He is receiving chemotherapy with FOLFIRI and Avastin, which he started in August.  His third cycle of chemotherapy was delayed as he was admitted with sepsis.  He completed IV antibiotics as an outpatient. CT abdomen pelvis from 01/07/2022 showed 12 mm short axis lymph node identified in the region of the porta hepatis. We reviewed these results today. We will repeat his MRI abdomen in December.   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  He was treated with surgical resection followed by 6 months of adjuvant chemotherapy with FOLFOX. He had ascites and peritoneal nodules in 2018 with negative biopsy and negative fluid cytology.  He was  taken for exploratory laparotomy with removal of the nodule which is found to be benign.  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  We discussed postoperative chemotherapy, but ultimately decided on surveillance.     Iron deficiency anemia due to chronic blood loss Microcytic anemia, which is stable. He denies any overt form of blood loss. Iron studies were equivocal in July. He was placed on ferrous sulfate daily with improvement in his hemoglobin. His HGB today is 14.1. CBC is stable. We will continue ferrous sulfate daily.    We will continue his FOLFIRI/bevacizumab cycle 7 on 03/21/22.  We will repeat MRI abdomen in December 2023, before his 4-week follow-up. He will return in 2 weeks with CBC and CMP for his next cycle. The patient understands the plans discussed today and is in agreement with them.  He knows to contact our office if he develops concerns prior to his next appointment.    I provided 20 minutes of face-to-face time during this encounter and > 50% was spent counseling as documented under my assessment and plan.    Derwood Kaplan, MD  [place] [address] [place] [address] Dept: [phone] Dept Fax: [phone]   No orders of the defined types were placed in this encounter.     CHIEF COMPLAINT:  CC: Recurrent colon cancer  Current Treatment: FOLFIRI/bevacizumab  HISTORY OF PRESENT ILLNESS:  The patient is a 51 y.o. gentleman with stage IIIB (T3 N2a  MO) sigmoid colon cancer.  He presented with fevers and chills in September 2017 and was found to have diverticulitis, which was treated.  CT at that time revealed a 1.7 cm lesion in the liver, which was nonspecific, as well as ectopic left kidney with a cyst.  He was then brought back for a colonoscopy, which revealed a 3.5 cm mass in the sigmoid.  Biopsy revealed high-grade  dysplasia.  CT abdomen and pelvis revealed an indeterminate lesion in the liver in addition to wall thickening in the sigmoid colon.  MRI abdomen revealed the lesion in the liver to represent complex cyst versus vascular lesion, however, follow-up in 6 months was recommended.  Air contrast barium enema revealed an apple-core lesion of the sigmoid colon.  Baseline CEA was 0.6.  He underwent surgical resection in November 2017.  Pathology revealed a 4 cm, grade 2, adenocarcinoma of the sigmoid colon, as well as chronic active diverticulitis with an area of perforation, however, the tumor was not perforated.  Tumor invaded through the muscularis propria into the pericolorectal tissues.  4/21 nodes were positive for metastasis.  Margins were clear.  MMR and MSI were normal.  KRAS and BRAF mutations were negative.  He had iron deficiency treated with oral iron supplement in the form of Hemocyte daily.  Due to his age of diagnosis and family history he underwent testing for hereditary non polyposis colorectal cancer with the Myriad myRisk Hereditary Cancer Panel test.  This did not reveal any clinically significant mutation or variants of uncertain significance.  Repeat MRI abdomen in January 2018 revealed a stable lesion within the right lobe of the liver, most consistent with benign lesion.  There was new small volume ascites seen.  The patient received adjuvant chemotherapy with FOLFOX  for 12 cycles, which was completed in June.  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  He was placed on gabapentin in July, then noticed abdominal bloating, which he attributed to the gabapentin, so he discontinued that after 1 month.  He was seen for routine follow-up in September 2018 with a repeat MRI abdomen to re-evaluate the liver lesion.  That revealed a large volume ascites with findings suspicious for peritoneal disease.  The lesion in the right lobe of the  liver was stable and still assistant with a benign lesion.  The patient underwent paracentesis with removal of 4.7 L of fluid, but fluid cytology was negative for malignancy.  PET scan in early October did not reveal any hypermetabolic activity, but moderate ascites was seen.  Ultrasound-guided peritoneal biopsy was negative for malignancy, revealing adipose tissue with fibrosis, patchy inflammation and minimal atypia.  His case was discussed at tumor conference and he was referred to Dr. Noberto Retort for consideration open biopsy.  Dr. Noberto Retort performed colonoscopy in October 2018, which was negative.  Follow-up colonoscopy in 3 years was recommended.  The patient then underwent exploratory laparotomy with multiple peritoneal biopsies.  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  We therefore recommended observation for the patient.  In November 2018, he was seen in the emergency room with abdominal pain, nausea and vomiting.  Gallbladder ultrasound revealed cholelithiasis with sludge and polyps, as well as mild gallbladder wall thickening.  Mild ascites was also seen.  CT abdomen and pelvis revealed mild diffuse gallbladder wall edema otherwise unchanged from PET-CT done in October.  He underwent cholecystectomy with findings of acute cholecystitis.  Pathology did not reveal any evidence of malignancy.    He was seen off schedule in April 2019, as he presented to Dr. Janace Aris office with abdominal pain and was found to have an elevated bilirubin and liver transaminases.  Due to the laboratory abnormalities, he underwent repeat MRI abdomen at that time, which revealed a stable lesion in the right hepatic lobe, which was felt to be indeterminate, with a 2nd smaller indeterminate lesion in the right hepatic lobe measuring 12 mm, which was new from previous imaging.  No hepatic steatosis or  ascites was seen.  CEA was normal.  Hepatitis panel and CMV were negative  we felt the laboratory abnormalities were most likely secondary to viral illness.  He was followed closely and underwent a repeat MRI abdomen in June, which remained stable. He had one again in December of 2019, which was stable.  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  MRI abdomen in January 2021 revealed the mass in the junction of segments 7 and 8 in the liver has increased in size compared to the prior exam, currently 2.9 x 2.1 x 2.5 cm.  The lesion has a branching component, some of which may be from portal vein thrombus or tumor thrombus, even this branching component appears to enhance on subtraction images.   Biopsy in February revealed adenocarcinoma with necrosis, consistent with metastasis from colon primary.  He underwent surgical resection in February with Dr. Rolla Etienne at [facility]  We discussed the option of further chemotherapy, including the risks and benefits, and he opted for surveillance.  MRI abdomen in May 2023 revealed another solitary metastasis in the liver.  Treated with partial hepatectomy in June with Dr. Cecil Cobbs.  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  There was 1 negative node, but margins were close.  This was attached to the diaphragm, but the tissue from the diaphragm was benign.  He had a complicated postoperative course developing an cyst requiring drainage.  He then developed a right pleural effusion in July treated with a VATS procedure.  He is receiving chemotherapy with FOLFIRI and Avastin, which he started in August   Oncology History  Colon cancer [facility])  01/23/2016 Cancer Staging   Staging  form: Colon and Rectum, AJCC 7th Edition - Clinical stage from 01/23/2016: Stage IIIB (T3, N2a, M0) - Signed by Derwood Kaplan, MD on 05/03/2020 Staging comments: 3.5 cm MMR, MSI normal, No mutation of KRAS or BRAF, neg.genetics Rec'd 6 months adjuvant FOLFOX chemotherapy   02/02/2016 Initial Diagnosis   Colon cancer ([place])   12/13/2021 - 12/29/2021 Chemotherapy   Patient is on Treatment Plan : COLORECTAL FOLFIRI / BEVACIZUMAB Q14D     12/13/2021 -  Chemotherapy   Patient is on Treatment Plan : COLORECTAL FOLFIRI + Bevacizumab q14d     Malignant neoplasm metastatic to liver ([place])  05/29/2019 Initial Diagnosis   Malignant neoplasm metastatic to liver ([place])   12/13/2021 - 12/29/2021 Chemotherapy   Patient is on Treatment Plan : COLORECTAL FOLFIRI / BEVACIZUMAB Q14D     12/13/2021 -  Chemotherapy   Patient is on Treatment Plan : COLORECTAL FOLFIRI + Bevacizumab q14d  01/07/2022 Imaging   CT abdomen and pelvis:  IMPRESSION:  1. Interval right hepatectomy with surgical changes in the dome of  the left liver.  2. 12 mm short axis lymph node identified in the region of the porta  hepatis. Attention on follow-up recommended.  3. Left pelvic kidney with large simple appearing cyst measuring  12.5 cm, increased from 7.8 cm on 03/14/2017.  4. No acute findings in the abdomen or pelvis. Specifically, no  findings to explain the patient's history of abdominal pain and  fever.        INTERVAL HISTORY:  Shamarion is here today for repeat clinical assessment prior to his 7th cycle of FOLFIRI/bevacizumab for recurrent liver metastases. He states that he is doing well overall and has had improvement of his energy level. He has been more active and has resumed light yard work. He reports that his appetite is great. He has lost 1lb since last visit. He reports some constipation for a few days after his treatment but this is well controlled with Colace. He denies signs of infection such as sore throat,  sinus drainage, cough, or urinary symptoms.  He denies fevers or recurrent chills. He denies pain. He denies nausea, vomiting, chest pain, dyspnea or cough.   REVIEW OF SYSTEMS:  Review of Systems  Constitutional:  Negative for appetite change, chills, fever and unexpected weight change.  HENT:  Negative.  Negative for lump/mass, mouth sores and sore throat.   Eyes: Negative.   Respiratory:  Negative for chest tightness, cough, hemoptysis, shortness of breath and wheezing.   Cardiovascular: Negative.  Negative for chest pain, leg swelling and palpitations.  Gastrointestinal:  Positive for constipation. Negative for abdominal distention, abdominal pain, blood in stool, diarrhea, nausea and vomiting.  Endocrine: Negative.  Negative for hot flashes.  Genitourinary: Negative.  Negative for difficulty urinating, dysuria, frequency and hematuria.   Musculoskeletal:  Negative for arthralgias, back pain, flank pain and gait problem.  Skin: Negative.   Neurological: Negative.  Negative for dizziness, extremity weakness, gait problem, headaches, light-headedness, numbness, seizures and speech difficulty.  Hematological: Negative.  Negative for adenopathy. Does not bruise/bleed easily.  Psychiatric/Behavioral: Negative.  Negative for depression and sleep disturbance. The patient is not nervous/anxious.      VITALS:  Blood pressure (!) 154/108, pulse 71, temperature 97.8 F (36.6 C), temperature source Oral, resp. rate 20, height _0  (1.981 m), weight 269 lb 4.8 oz (122.2 kg), SpO2 99 %.  Wt Readings from Last 3 Encounters:  04/04/22 275 lb 12 oz (125.1 kg)  03/29/22 267 lb 9.6 oz (121.4 kg)  03/23/22 270 lb 8 oz (122.7 kg)    Body mass index is 31.12 kg/m.  Performance status (ECOG): 1 - Symptomatic but completely ambulatory  PHYSICAL EXAM:  Physical Exam Vitals and nursing note reviewed.  Constitutional:      General: He is not in acute distress.    Appearance: Normal appearance. He is  normal weight.  HENT:     Head: Normocephalic and atraumatic.     Mouth/Throat:     Mouth: Mucous membranes are moist.     Pharynx: Oropharynx is clear. No oropharyngeal exudate or posterior oropharyngeal erythema.  Eyes:     General: No scleral icterus.    Extraocular Movements: Extraocular movements intact.     Conjunctiva/sclera: Conjunctivae normal.     Pupils: Pupils are equal, round, and reactive to light.  Cardiovascular:     Rate and Rhythm: Normal rate and regular rhythm.  Heart sounds: Normal heart sounds. No murmur heard.    No friction rub. No gallop.  Pulmonary:     Effort: Pulmonary effort is normal.     Breath sounds: Normal breath sounds. No wheezing, rhonchi or rales.  Abdominal:     General: Bowel sounds are normal. There is no distension.     Palpations: Abdomen is soft. There is no hepatomegaly, splenomegaly or mass.     Tenderness: There is no abdominal tenderness.  Musculoskeletal:        General: Normal range of motion.     Cervical back: Normal range of motion and neck supple. No tenderness.     Right lower leg: No edema.     Left lower leg: No edema.  Lymphadenopathy:     Cervical: No cervical adenopathy.     Upper Body:     Right upper body: No supraclavicular or axillary adenopathy.     Left upper body: No supraclavicular or axillary adenopathy.     Lower Body: No right inguinal adenopathy. No left inguinal adenopathy.  Skin:    General: Skin is warm and dry.     Coloration: Skin is not jaundiced.     Findings: No rash.  Neurological:     Mental Status: He is alert and oriented to person, place, and time.     Cranial Nerves: No cranial nerve deficit.  Psychiatric:        Mood and Affect: Mood normal.        Behavior: Behavior normal.        Thought Content: Thought content normal.     LABS:      Latest Ref Rng & Units 03/29/2022   12:00 AM 03/02/2022   12:00 AM 02/17/2022   12:00 AM  CBC  WBC  3.4     3.4     4.8      Hemoglobin  13.5 - 17.5 13.3     12.7     12.6      Hematocrit 41 - 53 39     39     38      Platelets 150 - 400 K/uL 215     222     316         This result is from an external source.      Latest Ref Rng & Units 03/29/2022    1:02 PM 03/02/2022   12:00 AM 02/17/2022   12:00 AM  CMP  Glucose 70 - 99 mg/dL 105     BUN 6 - 20 mg/dL _0 Creatinine 0.61 - 1.24 mg/dL 0.95  1.1     1.0      Sodium 135 - 145 mmol/L 133  140     140      Potassium 3.5 - 5.1 mmol/L 4.2  4.0     4.0      Chloride 98 - 111 mmol/L 99  106     105      CO2 22 - 32 mmol/L _1 Calcium 8.9 - 10.3 mg/dL 9.2  9.2     9.2      Total Protein 6.5 - 8.1 g/dL 7.1     Total Bilirubin 0.3 - 1.2 mg/dL 0.7     Alkaline Phos 38 - 126 U/L 64  74     86  AST 15 - 41 U/L 18  33     33      ALT 0 - 44 U/L _0 This result is from an external source.     Lab Results  Component Value Date   CEA1 0.8 01/06/2022   /  CEA  Date Value Ref Range Status  01/06/2022 0.8 0.0 - 4.7 ng/mL Final    Comment:    (NOTE)                             Nonsmokers          <3.9                             Smokers             <5.6 [employer] Electrochemiluminescence Immunoassay (ECLIA) Values obtained with different assay methods or kits cannot be used interchangeably.  Results cannot be interpreted as absolute evidence of the presence or absence of malignant disease. Performed At: [facility] [place], [place] [id number] Rush Farmer MD RF:[id number]    No results found for: "PSA1" No results found for: "CAN199" No results found for: "CAN125"  No results found for: "TOTALPROTELP", "ALBUMINELP", "A1GS", "A2GS", "BETS", "BETA2SER", "GAMS", "MSPIKE", "SPEI" Lab Results  Component Value Date   TIBC 225 (L) 11/22/2021   FERRITIN 292 11/22/2021   IRONPCTSAT 6 (L) 11/22/2021   No results found for: "LDH"  STUDIES:  No results found.   CT abdomen and pelvis  01/07/2022 IMPRESSION:  1. Interval right hepatectomy with surgical changes in the dome of  the left liver.  2. 12 mm short axis lymph node identified in the region of the porta  hepatis. Attention on follow-up recommended.  3. Left pelvic kidney with large simple appearing cyst measuring  12.5 cm, increased from 7.8 cm on 03/14/2017.  4. No acute findings in the abdomen or pelvis. Specifically, no  findings to explain the patient's history of abdominal pain and  fever.    NUCLEAR MEDICINE PET SKULL BASE TO THIGH 10/07/2021 TECHNIQUE: 14.0 mCi F-18 FDG was injected intravenously. Full-ring PET imaging was performed from the skull base to thigh after the radiotracer. CT data was obtained and used for attenuation correction and anatomic localization.   Fasting blood glucose: 110 mg/dl   COMPARISON:  MRI 09/29/2021 and PET-CT 05/30/2019. Calcified granuloma identified in the right middle lobe, image 44/7.   FINDINGS: Mediastinal blood pool activity: SUV max 2.86   Liver activity: SUV max NA   NECK: No hypermetabolic lymph nodes in the neck.   Incidental CT findings: none   CHEST: No hypermetabolic mediastinal or hilar nodes. No suspicious pulmonary nodules on the CT scan.   Incidental CT findings: none   ABDOMEN/PELVIS: Tracer avid lesion within the dome of liver has an SUV max of 6.41, image 107 of the fused PET-CT images. This is not visible on the corresponding unenhanced CT images. On the PET-CT from 05/30/2019 this had an SUV max of 4.3. On the recent MRI of the abdomen this measured 2.2 x 2.5 x 2.7 cm. No additional abnormal foci of increased radiotracer uptake within the liver.   No abnormal uptake identified within the pancreas, spleen or adrenal glands. No tracer avid right abdominopelvic lymph nodes. Anastomotic suture line is identified  at the rectosigmoid junction. No signs of locally recurrent tumor.   Incidental CT findings: Left pelvic kidney with large  Bosniak class 1 exophytic cyst is again noted. No follow-up recommended.   SKELETON: No focal hypermetabolic activity to suggest skeletal metastasis.   Incidental CT findings: none   IMPRESSION: 1. Increased radiotracer uptake is identified within the dome of liver corresponding to the enhancing liver lesion on recent MRI of the abdomen. 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 2. No additional sites of FDG avid disease.   MRI ABDOMEN WITHOUT AND WITH CONTRAST 09/29/2021 TECHNIQUE: Multiplanar multisequence MR imaging of the abdomen was performed both before and after the administration of intravenous contrast.   CONTRAST:  3m GADAVIST GADOBUTROL 1 MMOL/ML IV SOLN   COMPARISON:  Abdominal MRI 12/30/2020.   FINDINGS: Lower chest: Unremarkable.   Hepatobiliary: Mild diffuse loss of signal intensity throughout the hepatic parenchyma, indicative of a background of mild hepatic steatosis. Again noted are areas of susceptibility artifact in the superior aspect of the right lobe of the liver related to prior partial hepatectomy. 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 No other definite suspicious hepatic lesions are noted. No intra or extrahepatic biliary ductal dilatation. Status post cholecystectomy. Common bile duct measures 4 mm in the porta hepatis.   Pancreas: No pancreatic mass. No pancreatic ductal dilatation. No pancreatic or peripancreatic fluid collections or inflammatory changes.   Spleen:  Unremarkable.   Adrenals/Urinary Tract: Left kidney not visualized (previous imaging demonstrates a left pelvic kidney). Right kidney and  bilateral adrenal glands are normal in appearance. No right hydroureteronephrosis in the visualized portions of the abdomen.   Stomach/Bowel: Visualized portions are unremarkable.   Vascular/Lymphatic: No aneurysm identified in the visualized abdominal vasculature. No lymphadenopathy noted in the abdomen.   Other: No significant volume of ascites noted in the visualized portions of the peritoneal cavity.   Musculoskeletal: No aggressive appearing osseous lesions are noted in the visualized portions of the skeleton.   IMPRESSION: 1. 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 No new sites of metastatic disease are otherwise noted.   These results will be called to the ordering clinician or representative by the Radiologist Assistant, and communication documented in the PACS or [employer]   HISTORY:   Past Medical History:  Diagnosis Date   GERD (gastroesophageal reflux disease)    Renal lithiasis     Past Surgical History:  Procedure Laterality Date   LIVER RESECTION Right 07/02/2019   LIVER RESECTION Right 10/12/2021   SIGMOIDECTOMY  03/2016    Family History  Problem Relation Age of Onset   Lung cancer Father 712  Leukemia Father 866      acute myelogenous   Colon cancer Sister 562   Social History:  reports that he has quit smoking. He has never used smokeless tobacco. He reports that he does not currently use alcohol. No history on file for drug use.The patient is accompanied by his wife today.  Allergies: No Known Allergies  Current Medications: Current Outpatient Medications  Medication Sig Dispense Refill   magic mouthwash (nystatin, lidocaine, diphenhydrAMINE,  alum & mag hydroxide) suspension SWISH AND/OR SWALLOW 5 ML BY MOUTH EVERY THREE HOURS IF NEEDED     Misc Natural Products (ELDERBERRY IMMUNE COMPLEX) CHEW Chew 1 Dose by mouth daily.      ascorbic acid (VITAMIN C) 1000 MG tablet Take by mouth.     benzonatate (TESSALON) 200 MG capsule Take 1 capsule (200 mg total) by mouth 3 (three) times daily as needed for cough. (Patient not taking: Reported on 03/02/2022) 20 capsule 1   Calcium Polycarbophil (FIBER) 625 MG TABS Take by mouth.     cetirizine (ZYRTEC) 10 MG tablet Take by mouth.     Cholecalciferol (VITAMIN D) 50 MCG (2000 UT) tablet Take by mouth.     Docusate Sodium (DSS) 100 MG CAPS Take 1 capsule by mouth daily.     ferrous sulfate 325 (65 FE) MG tablet Take 1 capsule by mouth every morning.     hydrocortisone (ANUSOL-HC) 2.5 % rectal cream Place 1 Application rectally 2 (two) times daily. 30 g 0   loperamide (IMODIUM) 2 MG capsule Take 1 capsule (2 mg total) by mouth as needed for diarrhea or loose stools. Take 2 at diarrhea onset , then 1 every 2hr until 12hrs with no BM. May take 2 every 4hrs at night. If diarrhea recurs repeat. 100 capsule 5   LORazepam (ATIVAN) 1 MG tablet Take 1 tablet (1 mg total) by mouth every 6 (six) hours as needed for anxiety or sleep. 100 tablet 0   Multiple Vitamin (MULTI-VITAMIN) tablet Take 1 tablet by mouth daily.     omeprazole (PRILOSEC) 20 MG capsule Take by mouth.     ondansetron (ZOFRAN) 8 MG tablet Take 1 tablet (8 mg total) by mouth 2 (two) times daily as needed for refractory nausea / vomiting. Start on day 3 after chemotherapy. 30 tablet 1   ondansetron (ZOFRAN-ODT) 4 MG disintegrating tablet Take 4 mg by mouth every 8 (eight) hours as needed. (Patient not taking: Reported on 03/02/2022)     oxyCODONE (OXY IR/ROXICODONE) 5 MG immediate release tablet Take by mouth.     Probiotic Product (PROBIOTIC BLEND PO) Take 1 tablet by mouth daily.     prochlorperazine (COMPAZINE) 10 MG tablet Take 1 tablet (10 mg total) by mouth every 6 (six) hours as needed (NAUSEA). (Patient not taking: Reported on 03/02/2022) 30 tablet 1   vitamin B-12 (CYANOCOBALAMIN) 250 MCG tablet Take by mouth.     zinc  gluconate 50 MG tablet Take 50 mg by mouth daily.     No current facility-administered medications for this visit.   Facility-Administered Medications Ordered in Other Visits  Medication Dose Route Frequency Provider Last Rate Last Admin   atropine 1 MG/ML injection            palonosetron (ALOXI) 0.25 MG/5ML injection               I,Alexis Herring,acting as a scribe for Derwood Kaplan, MD.,have documented all relevant documentation on the behalf of Derwood Kaplan, MD,as directed by  Derwood Kaplan, MD while in the presence of Derwood Kaplan, MD.

## 2022-03-20 MED FILL — Fluorouracil IV Soln 5 GM/100ML (50 MG/ML): INTRAVENOUS | Qty: 122 | Status: AC

## 2022-03-20 MED FILL — Fluorouracil IV Soln 2.5 GM/50ML (50 MG/ML): INTRAVENOUS | Qty: 20 | Status: AC

## 2022-03-20 MED FILL — Dexamethasone Sodium Phosphate Inj 100 MG/10ML: INTRAMUSCULAR | Qty: 1 | Status: AC

## 2022-03-20 MED FILL — Leucovorin Calcium For Inj 350 MG: INTRAMUSCULAR | Qty: 51 | Status: AC

## 2022-03-20 MED FILL — Irinotecan HCl Inj 100 MG/5ML (20 MG/ML): INTRAVENOUS | Qty: 23 | Status: AC

## 2022-03-20 MED FILL — Bevacizumab-awwb IV Soln 400 MG/16ML (For Infusion): INTRAVENOUS | Qty: 24 | Status: AC

## 2022-03-21 ENCOUNTER — Inpatient Hospital Stay: Payer: BC Managed Care – PPO

## 2022-03-21 VITALS — BP 168/99 | HR 90 | Temp 97.8°F | Resp 20 | Ht 78.0 in | Wt 275.8 lb

## 2022-03-21 DIAGNOSIS — J90 Pleural effusion, not elsewhere classified: Secondary | ICD-10-CM | POA: Diagnosis not present

## 2022-03-21 DIAGNOSIS — Z5111 Encounter for antineoplastic chemotherapy: Secondary | ICD-10-CM | POA: Diagnosis not present

## 2022-03-21 DIAGNOSIS — D500 Iron deficiency anemia secondary to blood loss (chronic): Secondary | ICD-10-CM | POA: Diagnosis not present

## 2022-03-21 DIAGNOSIS — C187 Malignant neoplasm of sigmoid colon: Secondary | ICD-10-CM | POA: Diagnosis not present

## 2022-03-21 DIAGNOSIS — C189 Malignant neoplasm of colon, unspecified: Secondary | ICD-10-CM | POA: Diagnosis not present

## 2022-03-21 DIAGNOSIS — Z452 Encounter for adjustment and management of vascular access device: Secondary | ICD-10-CM | POA: Diagnosis not present

## 2022-03-21 DIAGNOSIS — Z5112 Encounter for antineoplastic immunotherapy: Secondary | ICD-10-CM | POA: Diagnosis not present

## 2022-03-21 DIAGNOSIS — C787 Secondary malignant neoplasm of liver and intrahepatic bile duct: Secondary | ICD-10-CM

## 2022-03-21 MED ORDER — IRINOTECAN HCL CHEMO INJECTION 100 MG/5ML
180.0000 mg/m2 | Freq: Once | INTRAVENOUS | Status: AC
  Administered 2022-03-21: 460 mg via INTRAVENOUS
  Filled 2022-03-21: qty 15

## 2022-03-21 MED ORDER — HEPARIN SOD (PORK) LOCK FLUSH 100 UNIT/ML IV SOLN: 500.0000 [IU] | Freq: Once | INTRAVENOUS | Status: DC | PRN

## 2022-03-21 MED ORDER — SODIUM CHLORIDE 0.9 % IV SOLN: Freq: Once | INTRAVENOUS | Status: AC

## 2022-03-21 MED ORDER — SODIUM CHLORIDE 0.9 % IV SOLN
10.0000 mg | Freq: Once | INTRAVENOUS | Status: AC
  Administered 2022-03-21: 10 mg via INTRAVENOUS
  Filled 2022-03-21: qty 10

## 2022-03-21 MED ORDER — ATROPINE SULFATE 1 MG/ML IV SOLN
0.5000 mg | Freq: Once | INTRAVENOUS | Status: AC | PRN
  Administered 2022-03-21: 0.5 mg via INTRAVENOUS
  Filled 2022-03-21: qty 1

## 2022-03-21 MED ORDER — PALONOSETRON HCL INJECTION 0.25 MG/5ML
0.2500 mg | Freq: Once | INTRAVENOUS | Status: AC
  Administered 2022-03-21: 0.25 mg via INTRAVENOUS
  Filled 2022-03-21: qty 5

## 2022-03-21 MED ORDER — SODIUM CHLORIDE 0.9 % IV SOLN
2375.0000 mg/m2 | INTRAVENOUS | Status: DC
  Administered 2022-03-21: 6100 mg via INTRAVENOUS
  Filled 2022-03-21: qty 122

## 2022-03-21 MED ORDER — SODIUM CHLORIDE 0.9 % IV SOLN
5.0000 mg/kg | Freq: Once | INTRAVENOUS | Status: AC
  Administered 2022-03-21: 600 mg via INTRAVENOUS
  Filled 2022-03-21: qty 16

## 2022-03-21 MED ORDER — FLUOROURACIL CHEMO INJECTION 2.5 GM/50ML
398.0000 mg/m2 | Freq: Once | INTRAVENOUS | Status: AC
  Administered 2022-03-21: 1000 mg via INTRAVENOUS
  Filled 2022-03-21: qty 20

## 2022-03-21 MED ORDER — LEUCOVORIN CALCIUM INJECTION 350 MG
397.0000 mg/m2 | Freq: Once | INTRAVENOUS | Status: AC
  Administered 2022-03-21: 1020 mg via INTRAVENOUS
  Filled 2022-03-21: qty 51

## 2022-03-21 MED ORDER — SODIUM CHLORIDE 0.9% FLUSH: 10.0000 mL | INTRAVENOUS | Status: DC | PRN

## 2022-03-21 NOTE — Patient Instructions (Signed)
Leucovorin Injection What is this medication? LEUCOVORIN (loo koe VOR in) prevents side effects from certain medications, such as methotrexate. It works by increasing folate levels. This helps protect healthy cells in your body. It may also be used to treat anemia caused by low levels of folate. It can also be used with fluorouracil, a type of chemotherapy, to treat colorectal cancer. It works by increasing the effects of fluorouracil in the body. This medicine may be used for other purposes; ask your health care provider or pharmacist if you have questions. What should I tell my care team before I take this medication? They need to know if you have any of these conditions: Anemia from low levels of vitamin B12 in the blood An unusual or allergic reaction to leucovorin, folic acid, other medications, foods, dyes, or preservatives Pregnant or trying to get pregnant Breastfeeding How should I use this medication? This medication is injected into a vein or a muscle. It is given by your care team in a hospital or clinic setting. Talk to your care team about the use of this medication in children. Special care may be needed. Overdosage: If you think you have taken too much of this medicine contact a poison control center or emergency room at once. NOTE: This medicine is only for you. Do not share this medicine with others. What if I miss a dose? Keep appointments for follow-up doses. It is important not to miss your dose. Call your care team if you are unable to keep an appointment. What may interact with this medication? Capecitabine Fluorouracil Phenobarbital Phenytoin Primidone Trimethoprim;sulfamethoxazole This list may not describe all possible interactions. Give your health care provider a list of all the medicines, herbs, non-prescription drugs, or dietary supplements you use. Also tell them if you smoke, drink alcohol, or use illegal drugs. Some items may interact with your medicine. What  should I watch for while using this medication? Your condition will be monitored carefully while you are receiving this medication. This medication may increase the side effects of 5-fluorouracil. Tell your care team if you have diarrhea or mouth sores that do not get better or that get worse. What side effects may I notice from receiving this medication? Side effects that you should report to your care team as soon as possible: Allergic reactions--skin rash, itching, hives, swelling of the face, lips, tongue, or throat This list may not describe all possible side effects. Call your doctor for medical advice about side effects. You may report side effects to FDA at 1-800-FDA-1088. Where should I keep my medication? This medication is given in a hospital or clinic. It will not be stored at home. NOTE: This sheet is a summary. It may not cover all possible information. If you have questions about this medicine, talk to your doctor, pharmacist, or health care provider.  2023 Elsevier/Gold Standard (2021-09-27 00:00:00) Fluorouracil Injection What is this medication? FLUOROURACIL (flure oh YOOR a sil) treats some types of cancer. It works by slowing down the growth of cancer cells. This medicine may be used for other purposes; ask your health care provider or pharmacist if you have questions. COMMON BRAND NAME(S): Adrucil What should I tell my care team before I take this medication? They need to know if you have any of these conditions: Blood disorders Dihydropyrimidine dehydrogenase (DPD) deficiency Infection, such as chickenpox, cold sores, herpes Kidney disease Liver disease Poor nutrition Recent or ongoing radiation therapy An unusual or allergic reaction to fluorouracil, other medications, foods, dyes,  or preservatives If you or your partner are pregnant or trying to get pregnant Breast-feeding How should I use this medication? This medication is injected into a vein. It is  administered by your care team in a hospital or clinic setting. Talk to your care team about the use of this medication in children. Special care may be needed. Overdosage: If you think you have taken too much of this medicine contact a poison control center or emergency room at once. NOTE: This medicine is only for you. Do not share this medicine with others. What if I miss a dose? Keep appointments for follow-up doses. It is important not to miss your dose. Call your care team if you are unable to keep an appointment. What may interact with this medication? Do not take this medication with any of the following: Live virus vaccines This medication may also interact with the following: Medications that treat or prevent blood clots, such as warfarin, enoxaparin, dalteparin This list may not describe all possible interactions. Give your health care provider a list of all the medicines, herbs, non-prescription drugs, or dietary supplements you use. Also tell them if you smoke, drink alcohol, or use illegal drugs. Some items may interact with your medicine. What should I watch for while using this medication? Your condition will be monitored carefully while you are receiving this medication. This medication may make you feel generally unwell. This is not uncommon as chemotherapy can affect healthy cells as well as cancer cells. Report any side effects. Continue your course of treatment even though you feel ill unless your care team tells you to stop. In some cases, you may be given additional medications to help with side effects. Follow all directions for their use. This medication may increase your risk of getting an infection. Call your care team for advice if you get a fever, chills, sore throat, or other symptoms of a cold or flu. Do not treat yourself. Try to avoid being around people who are sick. This medication may increase your risk to bruise or bleed. Call your care team if you notice any  unusual bleeding. Be careful brushing or flossing your teeth or using a toothpick because you may get an infection or bleed more easily. If you have any dental work done, tell your dentist you are receiving this medication. Avoid taking medications that contain aspirin, acetaminophen, ibuprofen, naproxen, or ketoprofen unless instructed by your care team. These medications may hide a fever. Do not treat diarrhea with over the counter products. Contact your care team if you have diarrhea that lasts more than 2 days or if it is severe and watery. This medication can make you more sensitive to the sun. Keep out of the sun. If you cannot avoid being in the sun, wear protective clothing and sunscreen. Do not use sun lamps, tanning beds, or tanning booths. Talk to your care team if you or your partner wish to become pregnant or think you might be pregnant. This medication can cause serious birth defects if taken during pregnancy and for 3 months after the last dose. A reliable form of contraception is recommended while taking this medication and for 3 months after the last dose. Talk to your care team about effective forms of contraception. Do not father a child while taking this medication and for 3 months after the last dose. Use a condom while having sex during this time period. Do not breastfeed while taking this medication. This medication may cause infertility. Talk to  your care team if you are concerned about your fertility. What side effects may I notice from receiving this medication? Side effects that you should report to your care team as soon as possible: Allergic reactions--skin rash, itching, hives, swelling of the face, lips, tongue, or throat Heart attack--pain or tightness in the chest, shoulders, arms, or jaw, nausea, shortness of breath, cold or clammy skin, feeling faint or lightheaded Heart failure--shortness of breath, swelling of the ankles, feet, or hands, sudden weight gain, unusual  weakness or fatigue Heart rhythm changes--fast or irregular heartbeat, dizziness, feeling faint or lightheaded, chest pain, trouble breathing High ammonia level--unusual weakness or fatigue, confusion, loss of appetite, nausea, vomiting, seizures Infection--fever, chills, cough, sore throat, wounds that don't heal, pain or trouble when passing urine, general feeling of discomfort or being unwell Low red blood cell level--unusual weakness or fatigue, dizziness, headache, trouble breathing Pain, tingling, or numbness in the hands or feet, muscle weakness, change in vision, confusion or trouble speaking, loss of balance or coordination, trouble walking, seizures Redness, swelling, and blistering of the skin over hands and feet Severe or prolonged diarrhea Unusual bruising or bleeding Side effects that usually do not require medical attention (report to your care team if they continue or are bothersome): Dry skin Headache Increased tears Nausea Pain, redness, or swelling with sores inside the mouth or throat Sensitivity to light Vomiting This list may not describe all possible side effects. Call your doctor for medical advice about side effects. You may report side effects to FDA at 1-800-FDA-1088. Where should I keep my medication? This medication is given in a hospital or clinic. It will not be stored at home. NOTE: This sheet is a summary. It may not cover all possible information. If you have questions about this medicine, talk to your doctor, pharmacist, or health care provider.  2023 Elsevier/Gold Standard (2021-08-23 00:00:00) Irinotecan Liposome Injection What is this medication? IRINOTECAN LIPOSOME (eye ri noe TEE kan LIP oh som) treats pancreatic cancer. It works by slowing down the growth of cancer cells. This medicine may be used for other purposes; ask your health care provider or pharmacist if you have questions. COMMON BRAND NAME(S): ONIVYDE What should I tell my care team  before I take this medication? They need to know if you have any of these conditions: Blockage in your bowels Dehydration Infection Low white blood cell levels Lung disease An unusual or allergic reaction to irinotecan liposome, irinotecan, other medications, foods, dyes, or preservatives Pregnant or trying to get pregnant Breast-feeding How should I use this medication? This medication is injected into a vein. It is given by your care team in a hospital or clinic setting. Talk to your care team about the use of this medication in children. Special care may be needed. Overdosage: If you think you have taken too much of this medicine contact a poison control center or emergency room at once. NOTE: This medicine is only for you. Do not share this medicine with others. What if I miss a dose? Keep appointments for follow-up doses. It is important not to miss your dose. Call your care team if you are unable to keep an appointment. What may interact with this medication? Do not take this medication with any of the following: Itraconazole This medication may also interact with the following: Certain antivirals for HIV or AIDS Certain medications for seizures, such as carbamazepine, fosphenytoin, phenytoin, phenobarbital Clarithromycin Gemfibrozil Nefazodone Rifabutin Rifampin Rifapentine St. John's Wort Voriconazole This list may not describe  all possible interactions. Give your health care provider a list of all the medicines, herbs, non-prescription drugs, or dietary supplements you use. Also tell them if you smoke, drink alcohol, or use illegal drugs. Some items may interact with your medicine. What should I watch for while using this medication? This medication may make you feel generally unwell. This is not uncommon as chemotherapy can affect healthy cells as well as cancer cells. Report any side effects. Continue your course of treatment even though you feel ill unless your care team  tells you to stop. You may need blood work while you are taking this medication. This medication can cause serious side effects and allergic reactions. To reduce your risk, your care team may give you other medications to take before receiving this one. Be sure to follow the directions from your care team. Check with your care team if you get an attack of diarrhea, nausea and vomiting, or if you sweat a lot. The loss of too much body fluid can make it dangerous for you to take this medication. This medication may cause infertility. Talk to your care team if you are concerned about your fertility. Talk to your care team if you wish to become pregnant or if you think you might be pregnant. This medication can cause serious birth defects if taken during pregnancy or if you get pregnant within 7 months after stopping therapy. A negative pregnancy test is required before starting this medication. A reliable form of contraception is recommended while taking this medication and for 7 months after stopping it. Talk to your care team about reliable forms of contraception. Use a condom during sex and for 4 months after stopping therapy. Tell your care team right away if you think your partner might be pregnant. This medication can cause serious birth defects. Do not breast-feed while taking this medication and for 1 month after stopping therapy. This medication may increase your risk of getting an infection. Call your care team for advice if you get a fever, chills, sore throat, or other symptoms of a cold or flu. Do not treat yourself. Try to avoid being around people who are sick. Avoid taking medications that contain aspirin, acetaminophen, ibuprofen, naproxen, or ketoprofen unless instructed by your care team. These medications may hide a fever. Be careful brushing or flossing your teeth or using a toothpick because you may get an infection or bleed more easily. If you have any dental work done, tell your  dentist you are receiving this medication. What side effects may I notice from receiving this medication? Side effects that you should report to your care team as soon as possible: Allergic reactions or angioedema--skin rash, itching or hives, swelling of the face, eyes, lips, tongue, arms, or legs, trouble swallowing or breathing Dry cough, shortness of breath or trouble breathing Diarrhea Infection--fever, chills, cough, or sore throat Side effects that usually do not require medical attention (report to your care team if they continue or are bothersome): Fatigue Loss of appetite Nausea Pain, redness, or swelling with sores inside the mouth or throat Vomiting This list may not describe all possible side effects. Call your doctor for medical advice about side effects. You may report side effects to FDA at 1-800-FDA-1088. Where should I keep my medication? This medication is given in a hospital or clinic. It will not be stored at home. NOTE: This sheet is a summary. It may not cover all possible information. If you have questions about this medicine, talk to  your doctor, pharmacist, or health care provider.  2023 Elsevier/Gold Standard (2021-06-23 00:00:00)

## 2022-03-23 ENCOUNTER — Inpatient Hospital Stay: Payer: BC Managed Care – PPO

## 2022-03-23 VITALS — BP 140/98 | HR 79 | Temp 98.0°F | Resp 18 | Ht 78.0 in | Wt 270.5 lb

## 2022-03-23 DIAGNOSIS — D500 Iron deficiency anemia secondary to blood loss (chronic): Secondary | ICD-10-CM | POA: Diagnosis not present

## 2022-03-23 DIAGNOSIS — C187 Malignant neoplasm of sigmoid colon: Secondary | ICD-10-CM | POA: Diagnosis not present

## 2022-03-23 DIAGNOSIS — C787 Secondary malignant neoplasm of liver and intrahepatic bile duct: Secondary | ICD-10-CM | POA: Diagnosis not present

## 2022-03-23 DIAGNOSIS — Z5112 Encounter for antineoplastic immunotherapy: Secondary | ICD-10-CM | POA: Diagnosis not present

## 2022-03-23 DIAGNOSIS — Z452 Encounter for adjustment and management of vascular access device: Secondary | ICD-10-CM | POA: Diagnosis not present

## 2022-03-23 DIAGNOSIS — J90 Pleural effusion, not elsewhere classified: Secondary | ICD-10-CM | POA: Diagnosis not present

## 2022-03-23 DIAGNOSIS — Z5111 Encounter for antineoplastic chemotherapy: Secondary | ICD-10-CM | POA: Diagnosis not present

## 2022-03-23 MED ORDER — SODIUM CHLORIDE 0.9% FLUSH
10.0000 mL | INTRAVENOUS | Status: DC | PRN
  Administered 2022-03-23: 10 mL

## 2022-03-23 MED ORDER — HEPARIN SOD (PORK) LOCK FLUSH 100 UNIT/ML IV SOLN
500.0000 [IU] | Freq: Once | INTRAVENOUS | Status: AC | PRN
  Administered 2022-03-23: 500 [IU]

## 2022-03-23 NOTE — Patient Instructions (Signed)
The chemotherapy medication bag should finish at 46 hours, 96 hours, or 7 days. For example, if your pump is scheduled for 46 hours and it was put on at 4:00 p.m., it should finish at 2:00 p.m. the day it is scheduled to come off regardless of your appointment time.     Estimated time to finish at 1247.   If the display on your pump reads "Low Volume" and it is beeping, take the batteries out of the pump and come to the cancer center for it to be taken off.   If the pump alarms go off prior to the pump reading "Low Volume" then call [phone] and someone can assist you.  If the plunger comes out and the chemotherapy medication is leaking out, please use your home chemo spill kit to clean up the spill. Do NOT use paper towels or other household products.  If you have problems or questions regarding your pump, please call either [phone] (24 hours a day) or the cancer center Monday-Friday 8:00 a.m.- 4:30 p.m. at the clinic number and we will assist you. If you are unable to get assistance, then go to the nearest Emergency Department and ask the staff to contact the IV team for assistance.   [facility]  Discharge Instructions: Thank you for choosing [facility] to provide your oncology and hematology care.  If you have a lab appointment with the [facility], please go directly to the [facility] and check in at the registration area.   Wear comfortable clothing and clothing appropriate for easy access to any Portacath or PICC line.   We strive to give you quality time with your provider. You may need to reschedule your appointment if you arrive late (15 or more minutes).  Arriving late affects you and other patients whose appointments are after yours.  Also, if you miss three or more appointments without notifying the office, you may be dismissed from the clinic at the provider's discretion.      For prescription refill requests, have your  pharmacy contact our office and allow 72 hours for refills to be completed.    Today you received the following chemotherapy and/or immunotherapy agents 21fouruoracil      To help prevent nausea and vomiting after your treatment, we encourage you to take your nausea medication as directed.  BELOW ARE SYMPTOMS THAT SHOULD BE REPORTED IMMEDIATELY: *FEVER GREATER THAN 100.4 F (38 C) OR HIGHER *CHILLS OR SWEATING *NAUSEA AND VOMITING THAT IS NOT CONTROLLED WITH YOUR NAUSEA MEDICATION *UNUSUAL SHORTNESS OF BREATH *UNUSUAL BRUISING OR BLEEDING *URINARY PROBLEMS (pain or burning when urinating, or frequent urination) *BOWEL PROBLEMS (unusual diarrhea, constipation, pain near the anus) TENDERNESS IN MOUTH AND THROAT WITH OR WITHOUT PRESENCE OF ULCERS (sore throat, sores in mouth, or a toothache) UNUSUAL RASH, SWELLING OR PAIN  UNUSUAL VAGINAL DISCHARGE OR ITCHING   Items with * indicate a potential emergency and should be followed up as soon as possible or go to the Emergency Department if any problems should occur.  Please show the CHEMOTHERAPY ALERT CARD or IMMUNOTHERAPY ALERT CARD at check-in to the Emergency Department and triage nurse.  Should you have questions after your visit or need to cancel or reschedule your appointment, please contact [facility] Dept: [phone]  and follow the prompts.  Office hours are 8:00 a.m. to 4:30 p.m. Monday - Friday. Please note that voicemails left after 4:00 p.m. may not be  returned until the following business day.  We are closed weekends and major holidays. You have access to a nurse at all times for urgent questions. Please call the main number to the clinic Dept: [phone] and follow the prompts.  For any non-urgent questions, you may also contact your provider using MyChart. We now offer e-Visits for anyone 66 and older to request care online for non-urgent symptoms. For details visit [email or web address].   Also  download the MyChart app! Go to the app store, search "MyChart", open the app, select [facility], and log in with your MyChart username and password.  Masks are optional in the cancer centers. If you would like for your care team to wear a mask while they are taking care of you, please let them know. You may have one support person who is at least 52 years old accompany you for your appointments.

## 2022-03-28 NOTE — Progress Notes (Shared)
[facility]  [address] [place],  [place]  [address] [phone]  Clinic Day:  03/28/22   Referring physician: Derwood Kaplan, MD  ASSESSMENT & PLAN:   Assessment & Plan: Malignant neoplasm metastatic to liver [facility] [address]) History of liver metastasis in January 2021 treated with resection alone.  He had recurrent liver metastasis treated with a partial hepatectomy in June.  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  There was 1 negative node but margins were close.  This was attached to the diaphragm but the tissue from the diaphragm is benign.  He had a complicated postoperative course developing an cyst requiring drainage.  He then developed a right pleural effusion in July treated with a VATS procedure. He did have postoperative complications including biliary leak and had ERCP with placement of an endoscopic stent into the biliary duct.  He was readmitted on June 27 with abscess and sepsis and perihepatic fluid collection that required placement of a drain.  He had fevers and has felt very poorly since that time.  He was then found to have pleural effusion on July 1 treated with a right thoracoscopic total pulmonary decortication. He is receiving chemotherapy with FOLFIRI and Avastin, which he started in August.  His third cycle of chemotherapy was delayed as he was admitted with sepsis.  He completed IV antibiotics as an outpatient. CT abdomen pelvis from 01/07/2022 showed 12 mm short axis lymph node identified in the region of the porta hepatis. We reviewed these results today. We will repeat his MRI abdomen.   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  He was treated with surgical resection followed by 6 months of adjuvant chemotherapy with FOLFOX. He had ascites and peritoneal nodules in 2018 with negative biopsy and negative fluid cytology.  He was taken for  exploratory laparotomy with removal of the nodule which is found to be benign.  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  We discussed postoperative chemotherapy, but ultimately decided on surveillance.     Iron deficiency anemia due to chronic blood loss Microcytic anemia, which is stable. He denies any overt form of blood loss. Iron studies were equivocal in July. He was placed on ferrous sulfate daily with improvement in his hemoglobin. His HGB today is 14.1. CBC is stable. We will continue ferrous sulfate daily.    We will continue his FOLFIRI/bevacizumab cycle 7 on 03/21/22.  We will repeat MRI abdomen in December 2023. He will return in 2 weeks with CBC and CMP for his next cycle. The patient understands the plans discussed today and is in agreement with them.  He knows to contact our office if he develops concerns prior to his next appointment.    I provided 20 minutes of face-to-face time during this encounter and > 50% was spent counseling as documented under my assessment and plan.    Eduardo Hester  [facility] AT [facility] [address] [place] [address] Dept: [phone] Dept Fax: [phone]   No orders of the defined types were placed in this encounter.     CHIEF COMPLAINT:  CC: Recurrent colon cancer  Current Treatment: FOLFIRI/bevacizumab  HISTORY OF PRESENT ILLNESS:  The patient is a 51 y.o. 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  He presented  with fevers and chills in September 2017 and was found to have diverticulitis, which was treated.  CT at that time revealed a 1.7 cm lesion in the liver, which was nonspecific, as well as ectopic left kidney with a cyst.  He was then brought back for a colonoscopy, which revealed a 3.5 cm mass in the sigmoid.  Biopsy revealed high-grade dysplasia.  CT abdomen and pelvis  revealed an indeterminate lesion in the liver in addition to wall thickening in the sigmoid colon.  MRI abdomen revealed the lesion in the liver to represent complex cyst versus vascular lesion, however, follow-up in 6 months was recommended.  Air contrast barium enema revealed an apple-core lesion of the sigmoid colon.  Baseline CEA was 0.6.  He underwent surgical resection in November 2017.  Pathology revealed a 4 cm, grade 2, adenocarcinoma of the sigmoid colon, as well as chronic active diverticulitis with an area of perforation, however, the tumor was not perforated.  Tumor invaded through the muscularis propria into the pericolorectal tissues.  4/21 nodes were positive for metastasis.  Margins were clear.  MMR and MSI were normal.  KRAS and BRAF mutations were negative.  He had iron deficiency treated with oral iron supplement in the form of Hemocyte daily.  Due to his age of diagnosis and family history he underwent testing for hereditary non polyposis colorectal cancer with the Myriad myRisk Hereditary Cancer Panel test.  This did not reveal any clinically significant mutation or variants of uncertain significance.  Repeat MRI abdomen in January 2018 revealed a stable lesion within the right lobe of the liver, most consistent with benign lesion.  There was new small volume ascites seen.  The patient received adjuvant chemotherapy with FOLFOX  for 12 cycles, which was completed in June.  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  He was placed on gabapentin in July, then noticed abdominal bloating, which he attributed to the gabapentin, so he discontinued that after 1 month.  He was seen for routine follow-up in September 2018 with a repeat MRI abdomen to re-evaluate the liver lesion.  That revealed a large volume ascites with findings suspicious for peritoneal disease.  The lesion in the right lobe of the liver was stable and still assistant  with a benign lesion.  The patient underwent paracentesis with removal of 4.7 L of fluid, but fluid cytology was negative for malignancy.  PET scan in early October did not reveal any hypermetabolic activity, but moderate ascites was seen.  Ultrasound-guided peritoneal biopsy was negative for malignancy, revealing adipose tissue with fibrosis, patchy inflammation and minimal atypia.  His case was discussed at tumor conference and he was referred to Dr. Noberto Retort for consideration open biopsy.  Dr. Noberto Retort performed colonoscopy in October 2018, which was negative.  Follow-up colonoscopy in 3 years was recommended.  The patient then underwent exploratory laparotomy with multiple peritoneal biopsies.  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  We therefore recommended observation for the patient.  In November 2018, he was seen in the emergency room with abdominal pain, nausea and vomiting.  Gallbladder ultrasound revealed cholelithiasis with sludge and polyps, as well as mild gallbladder wall thickening.  Mild ascites was also seen.  CT abdomen and pelvis revealed mild diffuse gallbladder wall edema otherwise unchanged from PET-CT done in October.  He underwent cholecystectomy with findings of  acute cholecystitis.  Pathology did not reveal any evidence of malignancy.    He was seen off schedule in April 2019, as he presented to Dr. Janace Aris office with abdominal pain and was found to have an elevated bilirubin and liver transaminases.  Due to the laboratory abnormalities, he underwent repeat MRI abdomen at that time, which revealed a stable lesion in the right hepatic lobe, which was felt to be indeterminate, with a 2nd smaller indeterminate lesion in the right hepatic lobe measuring 12 mm, which was new from previous imaging.  No hepatic steatosis or ascites was seen.  CEA was normal.   Hepatitis panel and CMV were negative  we felt the laboratory abnormalities were most likely secondary to viral illness.  He was followed closely and underwent a repeat MRI abdomen in June, which remained stable. He had one again in December of 2019, which was stable.  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  MRI abdomen in January 2021 revealed the mass in the junction of segments 7 and 8 in the liver has increased in size compared to the prior exam, currently 2.9 x 2.1 x 2.5 cm.  The lesion has a branching component, some of which may be from portal vein thrombus or tumor thrombus, even this branching component appears to enhance on subtraction images.   Biopsy in February revealed adenocarcinoma with necrosis, consistent with metastasis from colon primary.  He underwent surgical resection in February with Dr. Rolla Etienne at [name].  We discussed the option of further chemotherapy, including the risks and benefits, and he opted for surveillance.  MRI abdomen in May 2023 revealed another solitary metastasis in the liver.  Treated with partial hepatectomy in June with Dr. Cecil Cobbs.  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  There was 1 negative node, but margins were close.  This was attached to the diaphragm, but the tissue from the diaphragm was benign.  He had a complicated postoperative course developing an cyst requiring drainage.  He then developed a right pleural effusion in July treated with a VATS procedure.  He is receiving chemotherapy with FOLFIRI and Avastin, which he started in August   Oncology History  Colon cancer [facility])  01/23/2016 Cancer Staging   Staging form: Colon and Rectum, AJCC 7th  Edition - Clinical stage from 01/23/2016: Stage IIIB (T3, N2a, M0) - Signed by Derwood Kaplan, MD on 05/03/2020 Staging comments: 3.5 cm MMR, MSI normal, No mutation of KRAS or BRAF, neg.genetics Rec'd 6 months adjuvant FOLFOX chemotherapy   02/02/2016 Initial Diagnosis   Colon cancer ([place])   12/13/2021 - 12/29/2021 Chemotherapy   Patient is on Treatment Plan : COLORECTAL FOLFIRI / BEVACIZUMAB Q14D     12/13/2021 -  Chemotherapy   Patient is on Treatment Plan : COLORECTAL FOLFIRI + Bevacizumab q14d     Malignant neoplasm metastatic to liver ([place])  05/29/2019 Initial Diagnosis   Malignant neoplasm metastatic to liver ([place])   12/13/2021 - 12/29/2021 Chemotherapy   Patient is on Treatment Plan : COLORECTAL FOLFIRI / BEVACIZUMAB Q14D     12/13/2021 -  Chemotherapy   Patient is on Treatment Plan : COLORECTAL FOLFIRI + Bevacizumab q14d     01/07/2022 Imaging  CT abdomen and pelvis:  IMPRESSION:  1. Interval right hepatectomy with surgical changes in the dome of  the left liver.  2. 12 mm short axis lymph node identified in the region of the porta  hepatis. Attention on follow-up recommended.  3. Left pelvic kidney with large simple appearing cyst measuring  12.5 cm, increased from 7.8 cm on 03/14/2017.  4. No acute findings in the abdomen or pelvis. Specifically, no  findings to explain the patient's history of abdominal pain and  fever.        INTERVAL HISTORY:  Eduardo Hester is here today for repeat clinical assessment prior to his 7th cycle of FOLFIRI/bevacizumab for recurrent liver metastases.    He states that he is doing well overall and has had improvement of his energy level. He has been more active and has resumed light yard work. He reports that his appetite is great. He has lost 1lb since last visit. He reports some constipation for a few days after his treatment but this is well controlled with Colace. He denies signs of infection such as sore throat, sinus drainage, cough, or  urinary symptoms.  He denies fevers or recurrent chills. He denies pain. He denies nausea, vomiting, chest pain, dyspnea or cough.   REVIEW OF SYSTEMS:  Review of Systems  Constitutional:  Negative for appetite change, chills, fever and unexpected weight change.  HENT:  Negative.  Negative for lump/mass, mouth sores and sore throat.   Eyes: Negative.   Respiratory:  Negative for chest tightness, cough, hemoptysis, shortness of breath and wheezing.   Cardiovascular: Negative.  Negative for chest pain, leg swelling and palpitations.  Gastrointestinal:  Positive for constipation. Negative for abdominal distention, abdominal pain, blood in stool, diarrhea, nausea and vomiting.  Endocrine: Negative.  Negative for hot flashes.  Genitourinary: Negative.  Negative for difficulty urinating, dysuria, frequency and hematuria.   Musculoskeletal:  Negative for arthralgias, back pain, flank pain and gait problem.  Skin: Negative.   Neurological: Negative.  Negative for dizziness, extremity weakness, gait problem, headaches, light-headedness, numbness, seizures and speech difficulty.  Hematological: Negative.  Negative for adenopathy. Does not bruise/bleed easily.  Psychiatric/Behavioral: Negative.  Negative for depression and sleep disturbance. The patient is not nervous/anxious.      VITALS:  There were no vitals taken for this visit.  Wt Readings from Last 3 Encounters:  03/23/22 270 lb 8 oz (122.7 kg)  03/21/22 275 lb 12 oz (125.1 kg)  03/17/22 269 lb 4.8 oz (122.2 kg)    There is no height or weight on file to calculate BMI.  Performance status (ECOG): 1 - Symptomatic but completely ambulatory  PHYSICAL EXAM:  Physical Exam Vitals and nursing note reviewed.  Constitutional:      General: He is not in acute distress.    Appearance: Normal appearance. He is normal weight.  HENT:     Head: Normocephalic and atraumatic.     Mouth/Throat:     Mouth: Mucous membranes are moist.     Pharynx:  Oropharynx is clear. No oropharyngeal exudate or posterior oropharyngeal erythema.  Eyes:     General: No scleral icterus.    Extraocular Movements: Extraocular movements intact.     Conjunctiva/sclera: Conjunctivae normal.     Pupils: Pupils are equal, round, and reactive to light.  Cardiovascular:     Rate and Rhythm: Normal rate and regular rhythm.     Heart sounds: Normal heart sounds. No murmur heard.    No friction rub. No gallop.  Pulmonary:     Effort: Pulmonary effort is normal.     Breath sounds: Normal breath sounds. No wheezing, rhonchi or rales.  Abdominal:     General: Bowel sounds are normal. There is no distension.     Palpations: Abdomen is soft. There is no hepatomegaly, splenomegaly or mass.     Tenderness: There is no abdominal tenderness.  Musculoskeletal:        General: Normal range of motion.     Cervical back: Normal range of motion and neck supple. No tenderness.     Right lower leg: No edema.     Left lower leg: No edema.  Lymphadenopathy:     Cervical: No cervical adenopathy.     Upper Body:     Right upper body: No supraclavicular or axillary adenopathy.     Left upper body: No supraclavicular or axillary adenopathy.     Lower Body: No right inguinal adenopathy. No left inguinal adenopathy.  Skin:    General: Skin is warm and dry.     Coloration: Skin is not jaundiced.     Findings: No rash.  Neurological:     Mental Status: He is alert and oriented to person, place, and time.     Cranial Nerves: No cranial nerve deficit.  Psychiatric:        Mood and Affect: Mood normal.        Behavior: Behavior normal.        Thought Content: Thought content normal.     LABS:      Latest Ref Rng & Units 03/02/2022   12:00 AM 02/17/2022   12:00 AM 02/03/2022   12:00 AM  CBC  WBC  3.4     4.8     5.6      Hemoglobin 13.5 - 17.5 12.7     12.6     12.1      Hematocrit 41 - 53 39     38     37      Platelets 150 - 400 K/uL 222     316     254          This result is from an external source.       Latest Ref Rng & Units 03/02/2022   12:00 AM 02/17/2022   12:00 AM 02/03/2022   12:00 AM  CMP  BUN 4 - _0 Creatinine 0.6 - 1.3 1.1     1.0     1.0      Sodium 137 - 147 140     140     139      Potassium 3.5 - 5.1 mEq/L 4.0     4.0     4.2      Chloride 99 - 108 106     105     106      CO2 13 - _1 Calcium 8.7 - 10.7 9.2     9.2     9.2      Alkaline Phos 25 - 125 74     86     103      AST 14 - 40 33     33     32      ALT 10 - 40 U/L 22  23     34         This result is from an external source.      Lab Results  Component Value Date   CEA1 0.8 01/06/2022   /  CEA  Date Value Ref Range Status  01/06/2022 0.8 0.0 - 4.7 ng/mL Final    Comment:    (NOTE)                             Nonsmokers          <3.9                             Smokers             <5.6 [employer] Electrochemiluminescence Immunoassay (ECLIA) Values obtained with different assay methods or kits cannot be used interchangeably.  Results cannot be interpreted as absolute evidence of the presence or absence of malignant disease. Performed At: [facility] [place], [place] [id number] Rush Farmer MD GH:[id number]    No results found for: "PSA1" No results found for: "CAN199" No results found for: "CAN125"  No results found for: "TOTALPROTELP", "ALBUMINELP", "A1GS", "A2GS", "BETS", "BETA2SER", "GAMS", "MSPIKE", "SPEI" Lab Results  Component Value Date   TIBC 225 (L) 11/22/2021   FERRITIN 292 11/22/2021   IRONPCTSAT 6 (L) 11/22/2021   No results found for: "LDH"  STUDIES:  No results found.   CT abdomen and pelvis 01/07/2022 IMPRESSION:  1. Interval right hepatectomy with surgical changes in the dome of  the left liver.  2. 12 mm short axis lymph node identified in the region of the porta  hepatis. Attention on follow-up recommended.  3. Left pelvic kidney with large  simple appearing cyst measuring  12.5 cm, increased from 7.8 cm on 03/14/2017.  4. No acute findings in the abdomen or pelvis. Specifically, no  findings to explain the patient's history of abdominal pain and  fever.    NUCLEAR MEDICINE PET SKULL BASE TO THIGH 10/07/2021 TECHNIQUE: 14.0 mCi F-18 FDG was injected intravenously. Full-ring PET imaging was performed from the skull base to thigh after the radiotracer. CT data was obtained and used for attenuation correction and anatomic localization.   Fasting blood glucose: 110 mg/dl   COMPARISON:  MRI 09/29/2021 and PET-CT 05/30/2019. Calcified granuloma identified in the right middle lobe, image 44/7.   FINDINGS: Mediastinal blood pool activity: SUV max 2.86   Liver activity: SUV max NA   NECK: No hypermetabolic lymph nodes in the neck.   Incidental CT findings: none   CHEST: No hypermetabolic mediastinal or hilar nodes. No suspicious pulmonary nodules on the CT scan.   Incidental CT findings: none   ABDOMEN/PELVIS: Tracer avid lesion within the dome of liver has an SUV max of 6.41, image 107 of the fused PET-CT images. This is not visible on the corresponding unenhanced CT images. On the PET-CT from 05/30/2019 this had an SUV max of 4.3. On the recent MRI of the abdomen this measured 2.2 x 2.5 x 2.7 cm. No additional abnormal foci of increased radiotracer uptake within the liver.   No abnormal uptake identified within the pancreas, spleen or adrenal glands. No tracer avid right abdominopelvic lymph nodes. Anastomotic suture line is identified at the rectosigmoid junction. No signs of locally recurrent tumor.   Incidental CT findings: Left pelvic kidney with large Bosniak class 1 exophytic  cyst is again noted. No follow-up recommended.   SKELETON: No focal hypermetabolic activity to suggest skeletal metastasis.   Incidental CT findings: none   IMPRESSION: 1. Increased radiotracer uptake is identified within the dome  of liver corresponding to the enhancing liver lesion on recent MRI of the abdomen. 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 2. No additional sites of FDG avid disease.   MRI ABDOMEN WITHOUT AND WITH CONTRAST 09/29/2021 TECHNIQUE: Multiplanar multisequence MR imaging of the abdomen was performed both before and after the administration of intravenous contrast.   CONTRAST:  83m GADAVIST GADOBUTROL 1 MMOL/ML IV SOLN   COMPARISON:  Abdominal MRI 12/30/2020.   FINDINGS: Lower chest: Unremarkable.   Hepatobiliary: Mild diffuse loss of signal intensity throughout the hepatic parenchyma, indicative of a background of mild hepatic steatosis. Again noted are areas of susceptibility artifact in the superior aspect of the right lobe of the liver related to prior partial hepatectomy. 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 No other definite suspicious hepatic lesions are noted. No intra or extrahepatic biliary ductal dilatation. Status post cholecystectomy. Common bile duct measures 4 mm in the porta hepatis.   Pancreas: No pancreatic mass. No pancreatic ductal dilatation. No pancreatic or peripancreatic fluid collections or inflammatory changes.   Spleen:  Unremarkable.   Adrenals/Urinary Tract: Left kidney not visualized (previous imaging demonstrates a left pelvic kidney). Right kidney and bilateral adrenal glands are normal in appearance. No right hydroureteronephrosis in the visualized portions of the abdomen.   Stomach/Bowel: Visualized portions are unremarkable.   Vascular/Lymphatic: No aneurysm identified in the visualized abdominal vasculature.  No lymphadenopathy noted in the abdomen.   Other: No significant volume of ascites noted in the visualized portions of the peritoneal cavity.   Musculoskeletal: No aggressive appearing osseous lesions are noted in the visualized portions of the skeleton.   IMPRESSION: 1. 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 No new sites of metastatic disease are otherwise noted.   These results will be called to the ordering clinician or representative by the Radiologist Assistant, and communication documented in the PACS or [employer]   HISTORY:   Past Medical History:  Diagnosis Date   GERD (gastroesophageal reflux disease)    Renal lithiasis     Past Surgical History:  Procedure Laterality Date   LIVER RESECTION Right 07/02/2019   LIVER RESECTION Right 10/12/2021   SIGMOIDECTOMY  03/2016    Family History  Problem Relation Age of Onset   Lung cancer Father 741  Leukemia Father 833      acute myelogenous   Colon cancer Sister 577   Social History:  reports that he has quit smoking. He has never used smokeless tobacco. He reports that he does not currently use alcohol. No history on file for drug use.The patient is accompanied by his wife today.  Allergies: No Known Allergies  Current Medications: Current Outpatient Medications  Medication Sig Dispense Refill   ascorbic acid (VITAMIN C) 1000 MG tablet Take by mouth.     benzonatate (TESSALON) 200 MG capsule Take 1 capsule (200 mg total) by mouth 3 (three)  times daily as needed for cough. (Patient not taking: Reported on 03/02/2022) 20 capsule 1   Calcium Polycarbophil (FIBER) 625 MG TABS Take by mouth.     cetirizine (ZYRTEC) 10 MG tablet Take by mouth.     Cholecalciferol (VITAMIN D) 50 MCG (2000 UT) tablet Take by mouth.     Docusate Sodium (DSS) 100 MG CAPS Take 1 capsule by mouth daily.     ferrous sulfate 325 (65  FE) MG tablet Take 1 capsule by mouth every morning.     hydrocortisone (ANUSOL-HC) 2.5 % rectal cream Place 1 Application rectally 2 (two) times daily. 30 g 0   loperamide (IMODIUM) 2 MG capsule Take 1 capsule (2 mg total) by mouth as needed for diarrhea or loose stools. Take 2 at diarrhea onset , then 1 every 2hr until 12hrs with no BM. May take 2 every 4hrs at night. If diarrhea recurs repeat. 100 capsule 5   LORazepam (ATIVAN) 1 MG tablet Take 1 tablet (1 mg total) by mouth every 6 (six) hours as needed for anxiety or sleep. 100 tablet 0   magic mouthwash (nystatin, lidocaine, diphenhydrAMINE, alum & mag hydroxide) suspension SWISH AND/OR SWALLOW 5 ML BY MOUTH EVERY THREE HOURS IF NEEDED     Misc Natural Products (ELDERBERRY IMMUNE COMPLEX) CHEW Chew 1 Dose by mouth daily.     Multiple Vitamin (MULTI-VITAMIN) tablet Take 1 tablet by mouth daily.     omeprazole (PRILOSEC) 20 MG capsule Take by mouth.     ondansetron (ZOFRAN) 8 MG tablet Take 1 tablet (8 mg total) by mouth 2 (two) times daily as needed for refractory nausea / vomiting. Start on day 3 after chemotherapy. 30 tablet 1   ondansetron (ZOFRAN-ODT) 4 MG disintegrating tablet Take 4 mg by mouth every 8 (eight) hours as needed. (Patient not taking: Reported on 03/02/2022)     oxyCODONE (OXY IR/ROXICODONE) 5 MG immediate release tablet Take by mouth.     Probiotic Product (PROBIOTIC BLEND PO) Take 1 tablet by mouth daily.     prochlorperazine (COMPAZINE) 10 MG tablet Take 1 tablet (10 mg total) by mouth every 6 (six) hours as needed (NAUSEA). (Patient not taking: Reported on 03/02/2022) 30 tablet 1   vitamin B-12 (CYANOCOBALAMIN) 250 MCG tablet Take by mouth.     zinc gluconate 50 MG tablet Take 50 mg by mouth daily.     No current facility-administered medications for this visit.   Facility-Administered Medications Ordered in Other Visits  Medication Dose Route Frequency Provider Last Rate Last Admin   atropine 1 MG/ML injection             palonosetron (ALOXI) 0.25 MG/5ML injection

## 2022-03-29 ENCOUNTER — Encounter: Payer: Self-pay | Admitting: Oncology

## 2022-03-29 ENCOUNTER — Other Ambulatory Visit: Payer: Self-pay | Admitting: Oncology

## 2022-03-29 ENCOUNTER — Inpatient Hospital Stay (HOSPITAL_BASED_OUTPATIENT_CLINIC_OR_DEPARTMENT_OTHER): Payer: BC Managed Care – PPO | Admitting: Oncology

## 2022-03-29 ENCOUNTER — Inpatient Hospital Stay: Payer: BC Managed Care – PPO

## 2022-03-29 VITALS — BP 154/109 | HR 80 | Temp 97.7°F | Resp 16 | Ht 78.0 in | Wt 267.6 lb

## 2022-03-29 DIAGNOSIS — C187 Malignant neoplasm of sigmoid colon: Secondary | ICD-10-CM

## 2022-03-29 DIAGNOSIS — D500 Iron deficiency anemia secondary to blood loss (chronic): Secondary | ICD-10-CM | POA: Diagnosis not present

## 2022-03-29 DIAGNOSIS — C787 Secondary malignant neoplasm of liver and intrahepatic bile duct: Secondary | ICD-10-CM

## 2022-03-29 DIAGNOSIS — Z452 Encounter for adjustment and management of vascular access device: Secondary | ICD-10-CM | POA: Diagnosis not present

## 2022-03-29 DIAGNOSIS — J90 Pleural effusion, not elsewhere classified: Secondary | ICD-10-CM | POA: Diagnosis not present

## 2022-03-29 DIAGNOSIS — Z5112 Encounter for antineoplastic immunotherapy: Secondary | ICD-10-CM | POA: Diagnosis not present

## 2022-03-29 DIAGNOSIS — Z5111 Encounter for antineoplastic chemotherapy: Secondary | ICD-10-CM | POA: Diagnosis not present

## 2022-03-29 LAB — CMP (CANCER CENTER ONLY)
ALT: 15 U/L (ref 0–44)
AST: 18 U/L (ref 15–41)
Albumin: 3.9 g/dL (ref 3.5–5.0)
Alkaline Phosphatase: 64 U/L (ref 38–126)
Anion gap: 7 (ref 5–15)
BUN: 14 mg/dL (ref 6–20)
CO2: 27 mmol/L (ref 22–32)
Calcium: 9.2 mg/dL (ref 8.9–10.3)
Chloride: 99 mmol/L (ref 98–111)
Creatinine: 0.95 mg/dL (ref 0.61–1.24)
GFR, Estimated: >60 mL/min (ref >60)
Glucose, Bld: 105 mg/dL — ABNORMAL HIGH (ref 70–99)
Potassium: 4.2 mmol/L (ref 3.5–5.1)
Sodium: 133 mmol/L — ABNORMAL LOW (ref 135–145)
Total Bilirubin: 0.7 mg/dL (ref 0.3–1.2)
Total Protein: 7.1 g/dL (ref 6.5–8.1)

## 2022-03-29 LAB — TOTAL PROTEIN, URINE DIPSTICK: Protein, ur: NEGATIVE mg/dL

## 2022-03-29 LAB — CBC AND DIFFERENTIAL
HCT: 39 — AB (ref 41–53)
Hemoglobin: 13.3 — AB (ref 13.5–17.5)
Neutrophils Absolute: 1.67
Platelets: 215 10*3/uL (ref 150–400)
WBC: 3.4

## 2022-03-29 LAB — CBC: RBC: 4.86 (ref 3.87–5.11)

## 2022-03-29 NOTE — Progress Notes (Signed)
[facility]  [address] [place],  [place]  [address] [phone]  Clinic Day:03/29/22   Referring physician: Derwood Kaplan, MD  ASSESSMENT & PLAN:   Assessment & Plan: Malignant neoplasm metastatic to liver [facility]) History of liver metastasis in January 2021 treated with resection alone.  He had recurrent liver metastasis treated with a partial hepatectomy in June.  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  There was 1 negative node but margins were close.  This was attached to the diaphragm but the tissue from the diaphragm is benign.  He had a complicated postoperative course developing an cyst requiring drainage.  He then developed a right pleural effusion in July treated with a VATS procedure. He did have postoperative complications including biliary leak and had ERCP with placement of an endoscopic stent into the biliary duct.  He was readmitted on June 27 with abscess and sepsis and perihepatic fluid collection that required placement of a drain. He is receiving chemotherapy with FOLFIRI and Avastin, which he started in August.  His third cycle of chemotherapy was delayed as he was admitted with sepsis.  He completed IV antibiotics as an outpatient. CT abdomen pelvis from 01/07/2022 showed 12 mm short axis lymph node identified in the region of the porta hepatis. We will repeat his MRI abdomen prior to next visit.   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  He was treated with surgical resection followed by 6 months of adjuvant chemotherapy with FOLFOX. He had ascites and peritoneal nodules in 2018 with negative biopsy and negative fluid cytology.  He was taken for exploratory laparotomy with removal of the nodule which is found to be benign.  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   We discussed postoperative chemotherapy at that time, but ultimately decided on surveillance.     Iron deficiency anemia due to chronic blood loss Microcytic anemia, which is stable. He denies any overt form of blood loss. Iron studies were equivocal in July. His HGB today is 13.3. We will continue ferrous sulfate daily.    Plan: We will continue his FOLFIRI/bevacizumab cycle 8 on 04/04/22.  We will repeat MRI abdomen in early December 2023, and the order was placed for this today. He will return in 2 weeks with CBC and CMP for his next cycle. The patient understands the plans discussed today and is in agreement with them.  He knows to contact our office if he develops concerns prior to his next appointment.    I provided 25 minutes of face-to-face time during this encounter and > 50% was spent counseling as documented under my assessment and plan.    Derwood Kaplan, MD  [facility] [address] [place] [address] Dept: [phone] Dept Fax: [phone]   Orders Placed This Encounter  Procedures   CBC and differential    This external order was created through the Results Console.   CBC    This external order was created through the Results Console.      CHIEF COMPLAINT:  CC: Recurrent colon cancer  Current Treatment: FOLFIRI/bevacizumab  HISTORY OF PRESENT ILLNESS:  The patient is a 51 y.o. 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  He presented with fevers and chills in September 2017  and was found to have diverticulitis, which was treated.  CT at that time revealed a 1.7 cm lesion in the liver, which was nonspecific, as well as ectopic left kidney with a cyst.  He was then brought back for a colonoscopy, which revealed a 3.5 cm mass in the sigmoid.  Biopsy revealed high-grade dysplasia.  CT abdomen and pelvis revealed an indeterminate lesion in the liver in addition to wall thickening in  the sigmoid colon.  MRI abdomen revealed the lesion in the liver to represent complex cyst versus vascular lesion, however, follow-up in 6 months was recommended.  Air contrast barium enema revealed an apple-core lesion of the sigmoid colon.  Baseline CEA was 0.6.  He underwent surgical resection in November 2017.  Pathology revealed a 4 cm, grade 2, adenocarcinoma of the sigmoid colon, as well as chronic active diverticulitis with an area of perforation, however, the tumor was not perforated.  Tumor invaded through the muscularis propria into the pericolorectal tissues.  4/21 nodes were positive for metastasis.  Margins were clear.  MMR and MSI were normal.  KRAS and BRAF mutations were negative.  He had iron deficiency treated with oral iron supplement in the form of Hemocyte daily.  Due to his age of diagnosis and family history he underwent testing for hereditary non polyposis colorectal cancer with the Myriad myRisk Hereditary Cancer Panel test.  This did not reveal any clinically significant mutation or variants of uncertain significance.  Repeat MRI abdomen in January 2018 revealed a stable lesion within the right lobe of the liver, most consistent with benign lesion.  There was new small volume ascites seen.  The patient received adjuvant chemotherapy with FOLFOX  for 12 cycles, which was completed in June.  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  He was placed on gabapentin in July, then noticed abdominal bloating, which he attributed to the gabapentin, so he discontinued that after 1 month.  He was seen for routine follow-up in September 2018 with a repeat MRI abdomen to re-evaluate the liver lesion.  That revealed a large volume ascites with findings suspicious for peritoneal disease.  The lesion in the right lobe of the liver was stable and still assistant with a benign lesion.  The patient underwent paracentesis with removal of 4.7 L  of fluid, but fluid cytology was negative for malignancy.  PET scan in early October did not reveal any hypermetabolic activity, but moderate ascites was seen.  Ultrasound-guided peritoneal biopsy was negative for malignancy, revealing adipose tissue with fibrosis, patchy inflammation and minimal atypia.  His case was discussed at tumor conference and he was referred to Dr. Noberto Retort for consideration open biopsy.  Dr. Noberto Retort performed colonoscopy in October 2018, which was negative.  Follow-up colonoscopy in 3 years was recommended.  The patient then underwent exploratory laparotomy with multiple peritoneal biopsies.  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  We therefore recommended observation for the patient.  In November 2018, he was seen in the emergency room with abdominal pain, nausea and vomiting.  Gallbladder ultrasound revealed cholelithiasis with sludge and polyps, as well as mild gallbladder wall thickening.  Mild ascites was also seen.  CT abdomen and pelvis revealed mild diffuse gallbladder wall edema otherwise unchanged from PET-CT done in October.  He underwent cholecystectomy with findings of acute cholecystitis.  Pathology did not reveal  any evidence of malignancy.    He was seen off schedule in April 2019, as he presented to Dr. Janace Aris office with abdominal pain and was found to have an elevated bilirubin and liver transaminases.  Due to the laboratory abnormalities, he underwent repeat MRI abdomen at that time, which revealed a stable lesion in the right hepatic lobe, which was felt to be indeterminate, with a 2nd smaller indeterminate lesion in the right hepatic lobe measuring 12 mm, which was new from previous imaging.  No hepatic steatosis or ascites was seen.  CEA was normal.  Hepatitis panel and CMV were negative  we felt the laboratory abnormalities were most  likely secondary to viral illness.  He was followed closely and underwent a repeat MRI abdomen in June, which remained stable. He had one again in December of 2019, which was stable.  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  MRI abdomen in January 2021 revealed the mass in the junction of segments 7 and 8 in the liver has increased in size compared to the prior exam, currently 2.9 x 2.1 x 2.5 cm.  The lesion has a branching component, some of which may be from portal vein thrombus or tumor thrombus, even this branching component appears to enhance on subtraction images.   Biopsy in February revealed adenocarcinoma with necrosis, consistent with metastasis from colon primary.  He underwent surgical resection in February with Dr. Rolla Etienne at [facility].  We discussed the option of further chemotherapy, including the risks and benefits, and he opted for surveillance.  MRI abdomen in May 2023 revealed another solitary metastasis in the liver.  Treated with partial hepatectomy in June with Dr. Cecil Cobbs.  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  There was 1 negative node, but margins were close.  This was attached to the diaphragm, but the tissue from the diaphragm was benign.  He had a complicated postoperative course developing an cyst requiring drainage.  He then developed a right pleural effusion in July treated with a VATS procedure.  He is receiving chemotherapy with FOLFIRI and Avastin, which he started in August   Oncology History  Colon cancer [facility])  01/23/2016 Cancer Staging   Staging form: Colon and Rectum, AJCC 7th Edition - Clinical stage from 01/23/2016: Stage IIIB (T3, N2a, M0) - Signed by Derwood Kaplan, MD on 05/03/2020 Staging comments: 3.5 cm MMR, MSI normal, No mutation of KRAS or BRAF, neg.genetics Rec'd 6 months adjuvant FOLFOX chemotherapy   02/02/2016 Initial Diagnosis   Colon cancer ([facility])   12/13/2021 - 12/29/2021 Chemotherapy   Patient is on Treatment Plan : COLORECTAL FOLFIRI / BEVACIZUMAB Q14D     12/13/2021 -  Chemotherapy   Patient is on Treatment Plan : COLORECTAL FOLFIRI + Bevacizumab q14d     Malignant neoplasm metastatic to liver ([place])  05/29/2019 Initial Diagnosis   Malignant neoplasm metastatic to liver ([place])   12/13/2021 - 12/29/2021 Chemotherapy   Patient is on Treatment Plan : COLORECTAL FOLFIRI / BEVACIZUMAB Q14D     12/13/2021 -  Chemotherapy   Patient is on Treatment Plan : COLORECTAL FOLFIRI + Bevacizumab q14d     01/07/2022 Imaging   CT abdomen and pelvis:  IMPRESSION:  1. Interval right hepatectomy with surgical changes in the dome of  the left liver.  2. 12 mm short axis lymph node identified in the region of the porta  hepatis. Attention on follow-up recommended.  3. Left pelvic kidney with large simple appearing cyst measuring  12.5 cm, increased from 7.8 cm on 03/14/2017.  4. No acute findings in the abdomen or pelvis. Specifically, no  findings to explain the patient's history of abdominal pain and  fever.        INTERVAL HISTORY:  Kashif is here today for repeat clinical assessment prior to his 8th cycle of FOLFIRI/bevacizumab for recurrent liver metastases. He reports that he is doing well overall. He feels fatigued for a few days after treatment but has been active in his yard without much difficulty. We reviewed his CBC today which showed WBC of 3.4, ANC stable, and his HGB improved from 12.7 to 13.3. He states that he has been compliant his medications including iron supplementation and B12. We discussed getting repeat imaging for monitoring and will get a MRI abdomen in early December at [facility]. He denies signs of infection such as  sore throat, sinus drainage, cough, or urinary symptoms.  He denies fevers or recurrent chills. He denies pain. He denies nausea, vomiting, chest pain, dyspnea or cough. His weight has decreased 8 pounds over last 2 weeks . He is currently on short-term disability and is transitioning to long-term on 04/18/22.  REVIEW OF SYSTEMS:  Review of Systems  Constitutional:  Positive for fatigue. Negative for appetite change, chills, fever and unexpected weight change.  HENT:  Negative.  Negative for lump/mass, mouth sores and sore throat.   Eyes: Negative.   Respiratory: Negative.  Negative for chest tightness, cough, hemoptysis, shortness of breath and wheezing.   Cardiovascular: Negative.  Negative for chest pain, leg swelling and palpitations.  Gastrointestinal: Negative.  Negative for abdominal distention, abdominal pain, blood in stool, constipation, diarrhea, nausea and vomiting.  Endocrine: Negative.  Negative for hot flashes.  Genitourinary: Negative.  Negative for difficulty urinating, dysuria, frequency and hematuria.   Musculoskeletal:  Negative for arthralgias, back pain, flank pain and gait problem.  Skin: Negative.   Neurological: Negative.  Negative for dizziness, extremity weakness, gait problem, headaches, light-headedness, numbness, seizures and speech difficulty.  Hematological: Negative.  Negative for adenopathy. Does not bruise/bleed easily.  Psychiatric/Behavioral: Negative.  Negative for depression and sleep disturbance. The patient is not nervous/anxious.      VITALS:  Blood pressure (!) 154/109, pulse 80, temperature 97.7 F (36.5 C), temperature source Oral, resp. rate 16, height _0  (1.981 m), weight 267 lb 9.6 oz (121.4 kg), SpO2 100 %.  Wt Readings from Last 3 Encounters:  04/14/22 271 lb 6.4 oz (123.1 kg)  04/04/22 275 lb 12 oz (125.1 kg)  03/29/22 267 lb 9.6 oz (121.4 kg)    Body mass index is 30.92 kg/m.  Performance status (ECOG): 1 - Symptomatic but  completely ambulatory  PHYSICAL EXAM:  Physical Exam Vitals and nursing note reviewed.  Constitutional:      General: He is not in acute distress.    Appearance: Normal appearance. He is normal weight. He is not ill-appearing or toxic-appearing.  HENT:     Head: Normocephalic and atraumatic.     Mouth/Throat:     Mouth: Mucous membranes are moist.     Pharynx: Oropharynx is clear. No oropharyngeal exudate or posterior oropharyngeal erythema.  Eyes:     General: No scleral  icterus.    Extraocular Movements: Extraocular movements intact.     Conjunctiva/sclera: Conjunctivae normal.     Pupils: Pupils are equal, round, and reactive to light.  Cardiovascular:     Rate and Rhythm: Normal rate and regular rhythm.     Pulses: Normal pulses.     Heart sounds: Normal heart sounds. No murmur heard.    No friction rub. No gallop.  Pulmonary:     Effort: Pulmonary effort is normal. No respiratory distress.     Breath sounds: Normal breath sounds. No wheezing, rhonchi or rales.  Abdominal:     General: Bowel sounds are normal. There is no distension.     Palpations: Abdomen is soft. There is no hepatomegaly, splenomegaly or mass.     Tenderness: There is no abdominal tenderness.  Musculoskeletal:        General: No swelling or tenderness. Normal range of motion.     Cervical back: Normal range of motion and neck supple. No tenderness.     Right lower leg: No edema.     Left lower leg: No edema.  Lymphadenopathy:     Cervical: No cervical adenopathy.     Upper Body:     Right upper body: No supraclavicular or axillary adenopathy.     Left upper body: No supraclavicular or axillary adenopathy.     Lower Body: No right inguinal adenopathy. No left inguinal adenopathy.  Skin:    General: Skin is warm and dry.     Coloration: Skin is not jaundiced.     Findings: No rash.  Neurological:     Mental Status: He is alert and oriented to person, place, and time.     Cranial Nerves: No cranial  nerve deficit.  Psychiatric:        Mood and Affect: Mood normal.        Behavior: Behavior normal.        Thought Content: Thought content normal.     LABS:      Latest Ref Rng & Units 04/14/2022   12:00 AM 03/29/2022   12:00 AM 03/17/2022   12:00 AM  CBC  WBC  3.6     3.4     4.5      Hemoglobin 13.5 - 17.5 13.1     13.3     14.1      Hematocrit 41 - 53 39     39     43      Platelets 150 - 400 K/uL 205     215     220         This result is from an external source.      Latest Ref Rng & Units 04/14/2022    2:41 PM 03/29/2022    1:02 PM 03/17/2022   12:00 AM  CMP  Glucose 70 - 99 mg/dL 116  105    BUN 6 - 20 mg/dL _0 Creatinine 0.61 - 1.24 mg/dL 1.09  0.95  0.9      Sodium 135 - 145 mmol/L 141  133  134      Potassium 3.5 - 5.1 mmol/L 4.3  4.2  4.6      Chloride 98 - 111 mmol/L 105  99  107      CO2 22 - 32 mmol/L _1 Calcium 8.9 - 10.3 mg/dL 8.9  9.2  9.3  Total Protein 6.5 - 8.1 g/dL 6.0  7.1    Total Bilirubin 0.3 - 1.2 mg/dL 0.6  0.7    Alkaline Phos 38 - 126 U/L 63  64  80      AST 15 - 41 U/L 19  18  32      ALT 0 - 44 U/L _0 This result is from an external source.     Lab Results  Component Value Date   CEA1 0.8 01/06/2022   /  CEA  Date Value Ref Range Status  01/06/2022 0.8 0.0 - 4.7 ng/mL Final    Comment:    (NOTE)                             Nonsmokers          <3.9                             Smokers             <5.6 [employer] Electrochemiluminescence Immunoassay (ECLIA) Values obtained with different assay methods or kits cannot be used interchangeably.  Results cannot be interpreted as absolute evidence of the presence or absence of malignant disease. Performed At: [facility] [place], [place] [id number] Rush Farmer MD QQ:[id number]    No results found for: "PSA1" No results found for: "CAN199" No results found for: "CAN125"  No results found for:  "TOTALPROTELP", "ALBUMINELP", "A1GS", "A2GS", "BETS", "BETA2SER", "GAMS", "MSPIKE", "SPEI" Lab Results  Component Value Date   TIBC 225 (L) 11/22/2021   FERRITIN 292 11/22/2021   IRONPCTSAT 6 (L) 11/22/2021   No results found for: "LDH"  STUDIES:  MR Abdomen W Wo Contrast  Result Date: 04/13/2022 CLINICAL DATA:  Status post hepatic resection of metastatic colon cancer. EXAM: MRI ABDOMEN WITHOUT AND WITH CONTRAST TECHNIQUE: Multiplanar multisequence MR imaging of the abdomen was performed both before and after the administration of intravenous contrast. CONTRAST:  81m GADAVIST GADOBUTROL 1 MMOL/ML IV SOLN COMPARISON:  10/07/2021 PET and 09/29/2021 abdominal MRI. [facility]INDINGS: Lower chest: Normal heart size without pericardial or pleural effusion. Right hemidiaphragm elevation. Hepatobiliary: Near-complete right hepatectomy with extensive susceptibility artifact along the periphery of the remaining liver. No local recurrence or new hepatic metastasis. Cholecystectomy. Pancreas:  Normal, without mass or ductal dilatation. Spleen:  Normal in size, without focal abnormality. Adrenals/Urinary Tract: Normal adrenal glands. The left kidney is positioned in the pelvis, and incompletely imaged. The right kidney demonstrates an inter/lower pole 3 mm nonenhancing lesion which is likely a cyst . In the absence of clinically indicated signs/symptoms require(s) no independent follow-up. No hydronephrosis. Stomach/Bowel: Normal stomach and abdominal bowel loops. Vascular/Lymphatic: Aortic atherosclerosis. Patent portal vein. No abdominal adenopathy. The porta hepatis node on 01/07/2022 CT is no longer identified. Other:  No ascites.  No evidence of omental or peritoneal disease. Musculoskeletal: No acute osseous abnormality. IMPRESSION: 1. Near complete right hepatectomy, without residual/recurrent or new metastatic disease. 2. No evidence of extrahepatic abdominal  metastasis. 3.  Aortic Atherosclerosis (ICD10-I70.0). Electronically Signed   By: KAbigail MiyamotoM.D.   On: 04/13/2022 11:53     CT abdomen and pelvis 01/07/2022 IMPRESSION:  1. Interval right hepatectomy with surgical changes in the dome of  the left liver.  2. 12 mm short  axis lymph node identified in the region of the porta  hepatis. Attention on follow-up recommended.  3. Left pelvic kidney with large simple appearing cyst measuring  12.5 cm, increased from 7.8 cm on 03/14/2017.  4. No acute findings in the abdomen or pelvis. Specifically, no  findings to explain the patient's history of abdominal pain and  fever.    NUCLEAR MEDICINE PET SKULL BASE TO THIGH 10/07/2021 TECHNIQUE: 14.0 mCi F-18 FDG was injected intravenously. Full-ring PET imaging was performed from the skull base to thigh after the radiotracer. CT data was obtained and used for attenuation correction and anatomic localization.   Fasting blood glucose: 110 mg/dl   COMPARISON:  MRI 09/29/2021 and PET-CT 05/30/2019. Calcified granuloma identified in the right middle lobe, image 44/7.   FINDINGS: Mediastinal blood pool activity: SUV max 2.86   Liver activity: SUV max NA   NECK: No hypermetabolic lymph nodes in the neck.   Incidental CT findings: none   CHEST: No hypermetabolic mediastinal or hilar nodes. No suspicious pulmonary nodules on the CT scan.   Incidental CT findings: none   ABDOMEN/PELVIS: Tracer avid lesion within the dome of liver has an SUV max of 6.41, image 107 of the fused PET-CT images. This is not visible on the corresponding unenhanced CT images. On the PET-CT from 05/30/2019 this had an SUV max of 4.3. On the recent MRI of the abdomen this measured 2.2 x 2.5 x 2.7 cm. No additional abnormal foci of increased radiotracer uptake within the liver.   No abnormal uptake identified within the pancreas, spleen or adrenal glands. No tracer avid right abdominopelvic lymph nodes.  Anastomotic suture line is identified at the rectosigmoid junction. No signs of locally recurrent tumor.   Incidental CT findings: Left pelvic kidney with large Bosniak class 1 exophytic cyst is again noted. No follow-up recommended.   SKELETON: No focal hypermetabolic activity to suggest skeletal metastasis.   Incidental CT findings: none   IMPRESSION: 1. Increased radiotracer uptake is identified within the dome of liver corresponding to the enhancing liver lesion on recent MRI of the abdomen. 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 2. No additional sites of FDG avid disease.   MRI ABDOMEN WITHOUT AND WITH CONTRAST 09/29/2021 TECHNIQUE: Multiplanar multisequence MR imaging of the abdomen was performed both before and after the administration of intravenous contrast.   CONTRAST:  9m GADAVIST GADOBUTROL 1 MMOL/ML IV SOLN   COMPARISON:  Abdominal MRI 12/30/2020.   FINDINGS: Lower chest: Unremarkable.   Hepatobiliary: Mild diffuse loss of signal intensity throughout the hepatic parenchyma, indicative of a background of mild hepatic steatosis. Again noted are areas of susceptibility artifact in the superior aspect of the right lobe of the liver related to prior partial hepatectomy. 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 No other definite suspicious hepatic lesions are noted. No intra or extrahepatic biliary ductal dilatation. Status post cholecystectomy. Common bile duct measures 4 mm in the porta hepatis.   Pancreas: No pancreatic mass. No pancreatic ductal dilatation. No pancreatic or peripancreatic fluid collections or  inflammatory changes.   Spleen:  Unremarkable.   Adrenals/Urinary Tract: Left kidney not visualized (previous imaging demonstrates a left pelvic kidney). Right kidney and  bilateral adrenal glands are normal in appearance. No right hydroureteronephrosis in the visualized portions of the abdomen.   Stomach/Bowel: Visualized portions are unremarkable.   Vascular/Lymphatic: No aneurysm identified in the visualized abdominal vasculature. No lymphadenopathy noted in the abdomen.   Other: No significant volume of ascites noted in the visualized portions of the peritoneal cavity.   Musculoskeletal: No aggressive appearing osseous lesions are noted in the visualized portions of the skeleton.   IMPRESSION: 1. 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 No new sites of metastatic disease are otherwise noted.   These results will be called to the ordering clinician or representative by the Radiologist Assistant, and communication documented in the PACS or [employer].   HISTORY:   Past Medical History:  Diagnosis Date   GERD (gastroesophageal reflux disease)    Renal lithiasis     Past Surgical History:  Procedure Laterality Date   LIVER RESECTION Right 07/02/2019   LIVER RESECTION Right 10/12/2021   SIGMOIDECTOMY  03/2016    Family History  Problem Relation Age of Onset   Lung cancer Father 93   Leukemia Father 74       acute myelogenous   Colon cancer Sister 71    Social History:  reports that he has quit smoking. He has never used smokeless tobacco. He reports that he does not currently use alcohol. No history on file for drug use.The patient is accompanied by his wife today.  Allergies: No Known Allergies  Current Medications: Current Outpatient Medications  Medication Sig Dispense Refill   ascorbic acid (VITAMIN C) 1000 MG tablet Take by mouth.     benzonatate  (TESSALON) 200 MG capsule Take 1 capsule (200 mg total) by mouth 3 (three) times daily as needed for cough. (Patient not taking: Reported on 03/02/2022) 20 capsule 1   Calcium Polycarbophil (FIBER) 625 MG TABS Take by mouth.     cetirizine (ZYRTEC) 10 MG tablet Take by mouth.     Cholecalciferol (VITAMIN D) 50 MCG (2000 UT) tablet Take by mouth.     Docusate Sodium (DSS) 100 MG CAPS Take 1 capsule by mouth daily.     ferrous sulfate 325 (65 FE) MG tablet Take 1 capsule by mouth every morning.     hydrocortisone (PROCTO-MED HC) 2.5 % rectal cream APPLY 1 APPLICATION RECTALLY TWICE DAILY 28 g 2   loperamide (IMODIUM) 2 MG capsule Take 1 capsule (2 mg total) by mouth as needed for diarrhea or loose stools. Take 2 at diarrhea onset , then 1 every 2hr until 12hrs with no BM. May take 2 every 4hrs at night. If diarrhea recurs repeat. 100 capsule 5   LORazepam (ATIVAN) 1 MG tablet Take 1 tablet (1 mg total) by mouth every 6 (six) hours as needed for anxiety or sleep. 100 tablet 0   magic mouthwash (nystatin, lidocaine, diphenhydrAMINE, alum & mag hydroxide) suspension SWISH AND/OR SWALLOW 5 ML BY MOUTH EVERY THREE HOURS IF NEEDED     Misc Natural Products (ELDERBERRY IMMUNE COMPLEX) CHEW Chew 1 Dose by mouth daily.     Multiple Vitamin (MULTI-VITAMIN) tablet Take 1 tablet by mouth daily.     omeprazole (PRILOSEC) 20 MG capsule Take by mouth.     ondansetron (ZOFRAN) 8 MG tablet Take 1 tablet (8 mg total) by mouth 2 (two) times daily as needed for refractory nausea / vomiting. Start on day 3 after chemotherapy. 30 tablet 1  ondansetron (ZOFRAN-ODT) 4 MG disintegrating tablet Take 4 mg by mouth every 8 (eight) hours as needed. (Patient not taking: Reported on 03/02/2022)     oxyCODONE (OXY IR/ROXICODONE) 5 MG immediate release tablet Take by mouth.     Probiotic Product (PROBIOTIC BLEND PO) Take 1 tablet by mouth daily.     prochlorperazine (COMPAZINE) 10 MG tablet Take 1 tablet (10 mg total) by mouth every  6 (six) hours as needed (NAUSEA). (Patient not taking: Reported on 03/02/2022) 30 tablet 1   vitamin B-12 (CYANOCOBALAMIN) 250 MCG tablet Take by mouth.     zinc gluconate 50 MG tablet Take 50 mg by mouth daily.     No current facility-administered medications for this visit.   Facility-Administered Medications Ordered in Other Visits  Medication Dose Route Frequency Provider Last Rate Last Admin   atropine 1 MG/ML injection            palonosetron (ALOXI) 0.25 MG/5ML injection             I,Alexis Herring,acting as a scribe for Derwood Kaplan, MD.,have documented all relevant documentation on the behalf of Derwood Kaplan, MD,as directed by  Derwood Kaplan, MD while in the presence of Derwood Kaplan, MD.  I have reviewed this report as typed by the medical scribe, and it is complete and accurate.  Derwood Kaplan   04/17/22 7:17 AM

## 2022-03-30 ENCOUNTER — Other Ambulatory Visit: Payer: Self-pay

## 2022-04-03 MED FILL — Dexamethasone Sodium Phosphate Inj 100 MG/10ML: INTRAMUSCULAR | Qty: 1 | Status: AC

## 2022-04-03 MED FILL — Irinotecan HCl Inj 100 MG/5ML (20 MG/ML): INTRAVENOUS | Qty: 23 | Status: AC

## 2022-04-03 MED FILL — Leucovorin Calcium For Inj 350 MG: INTRAMUSCULAR | Qty: 51 | Status: AC

## 2022-04-03 MED FILL — Fluorouracil IV Soln 2.5 GM/50ML (50 MG/ML): INTRAVENOUS | Qty: 20 | Status: AC

## 2022-04-03 MED FILL — Bevacizumab-awwb IV Soln 400 MG/16ML (For Infusion): INTRAVENOUS | Qty: 24 | Status: AC

## 2022-04-03 MED FILL — Fluorouracil IV Soln 5 GM/100ML (50 MG/ML): INTRAVENOUS | Qty: 122 | Status: AC

## 2022-04-04 ENCOUNTER — Inpatient Hospital Stay: Payer: BC Managed Care – PPO

## 2022-04-04 VITALS — BP 167/80 | HR 88 | Temp 97.7°F | Resp 20 | Ht 78.0 in | Wt 275.8 lb

## 2022-04-04 DIAGNOSIS — Z452 Encounter for adjustment and management of vascular access device: Secondary | ICD-10-CM | POA: Diagnosis not present

## 2022-04-04 DIAGNOSIS — Z5111 Encounter for antineoplastic chemotherapy: Secondary | ICD-10-CM | POA: Diagnosis not present

## 2022-04-04 DIAGNOSIS — D500 Iron deficiency anemia secondary to blood loss (chronic): Secondary | ICD-10-CM | POA: Diagnosis not present

## 2022-04-04 DIAGNOSIS — C189 Malignant neoplasm of colon, unspecified: Secondary | ICD-10-CM | POA: Diagnosis not present

## 2022-04-04 DIAGNOSIS — C787 Secondary malignant neoplasm of liver and intrahepatic bile duct: Secondary | ICD-10-CM

## 2022-04-04 DIAGNOSIS — C187 Malignant neoplasm of sigmoid colon: Secondary | ICD-10-CM

## 2022-04-04 DIAGNOSIS — Z5112 Encounter for antineoplastic immunotherapy: Secondary | ICD-10-CM | POA: Diagnosis not present

## 2022-04-04 DIAGNOSIS — J90 Pleural effusion, not elsewhere classified: Secondary | ICD-10-CM | POA: Diagnosis not present

## 2022-04-04 MED ORDER — IRINOTECAN HCL CHEMO INJECTION 100 MG/5ML
180.0000 mg/m2 | Freq: Once | INTRAVENOUS | Status: AC
  Administered 2022-04-04: 460 mg via INTRAVENOUS
  Filled 2022-04-04: qty 15

## 2022-04-04 MED ORDER — ATROPINE SULFATE 1 MG/ML IV SOLN
0.5000 mg | Freq: Once | INTRAVENOUS | Status: AC | PRN
  Administered 2022-04-04: 0.5 mg via INTRAVENOUS
  Filled 2022-04-04: qty 1

## 2022-04-04 MED ORDER — SODIUM CHLORIDE 0.9 % IV SOLN
2375.0000 mg/m2 | INTRAVENOUS | Status: DC
  Administered 2022-04-04: 6100 mg via INTRAVENOUS
  Filled 2022-04-04: qty 122

## 2022-04-04 MED ORDER — SODIUM CHLORIDE 0.9% FLUSH: 10.0000 mL | INTRAVENOUS | Status: DC | PRN

## 2022-04-04 MED ORDER — SODIUM CHLORIDE 0.9 % IV SOLN
10.0000 mg | Freq: Once | INTRAVENOUS | Status: AC
  Administered 2022-04-04: 10 mg via INTRAVENOUS
  Filled 2022-04-04: qty 10

## 2022-04-04 MED ORDER — SODIUM CHLORIDE 0.9 % IV SOLN: Freq: Once | INTRAVENOUS | Status: AC

## 2022-04-04 MED ORDER — SODIUM CHLORIDE 0.9 % IV SOLN
5.0000 mg/kg | Freq: Once | INTRAVENOUS | Status: AC
  Administered 2022-04-04: 600 mg via INTRAVENOUS
  Filled 2022-04-04: qty 16

## 2022-04-04 MED ORDER — LEUCOVORIN CALCIUM INJECTION 350 MG
397.0000 mg/m2 | Freq: Once | INTRAVENOUS | Status: AC
  Administered 2022-04-04: 1020 mg via INTRAVENOUS
  Filled 2022-04-04: qty 51

## 2022-04-04 MED ORDER — PALONOSETRON HCL INJECTION 0.25 MG/5ML
0.2500 mg | Freq: Once | INTRAVENOUS | Status: AC
  Administered 2022-04-04: 0.25 mg via INTRAVENOUS
  Filled 2022-04-04: qty 5

## 2022-04-04 MED ORDER — HEPARIN SOD (PORK) LOCK FLUSH 100 UNIT/ML IV SOLN: 500.0000 [IU] | Freq: Once | INTRAVENOUS | Status: DC | PRN

## 2022-04-04 MED ORDER — FLUOROURACIL CHEMO INJECTION 2.5 GM/50ML
398.0000 mg/m2 | Freq: Once | INTRAVENOUS | Status: AC
  Administered 2022-04-04: 1000 mg via INTRAVENOUS
  Filled 2022-04-04: qty 20

## 2022-04-04 NOTE — Patient Instructions (Signed)
Leucovorin Injection What is this medication? LEUCOVORIN (loo koe VOR in) prevents side effects from certain medications, such as methotrexate. It works by increasing folate levels. This helps protect healthy cells in your body. It may also be used to treat anemia caused by low levels of folate. It can also be used with fluorouracil, a type of chemotherapy, to treat colorectal cancer. It works by increasing the effects of fluorouracil in the body. This medicine may be used for other purposes; ask your health care provider or pharmacist if you have questions. What should I tell my care team before I take this medication? They need to know if you have any of these conditions: Anemia from low levels of vitamin B12 in the blood An unusual or allergic reaction to leucovorin, folic acid, other medications, foods, dyes, or preservatives Pregnant or trying to get pregnant Breastfeeding How should I use this medication? This medication is injected into a vein or a muscle. It is given by your care team in a hospital or clinic setting. Talk to your care team about the use of this medication in children. Special care may be needed. Overdosage: If you think you have taken too much of this medicine contact a poison control center or emergency room at once. NOTE: This medicine is only for you. Do not share this medicine with others. What if I miss a dose? Keep appointments for follow-up doses. It is important not to miss your dose. Call your care team if you are unable to keep an appointment. What may interact with this medication? Capecitabine Fluorouracil Phenobarbital Phenytoin Primidone Trimethoprim;sulfamethoxazole This list may not describe all possible interactions. Give your health care provider a list of all the medicines, herbs, non-prescription drugs, or dietary supplements you use. Also tell them if you smoke, drink alcohol, or use illegal drugs. Some items may interact with your medicine. What  should I watch for while using this medication? Your condition will be monitored carefully while you are receiving this medication. This medication may increase the side effects of 5-fluorouracil. Tell your care team if you have diarrhea or mouth sores that do not get better or that get worse. What side effects may I notice from receiving this medication? Side effects that you should report to your care team as soon as possible: Allergic reactions--skin rash, itching, hives, swelling of the face, lips, tongue, or throat This list may not describe all possible side effects. Call your doctor for medical advice about side effects. You may report side effects to FDA at 1-800-FDA-1088. Where should I keep my medication? This medication is given in a hospital or clinic. It will not be stored at home. NOTE: This sheet is a summary. It may not cover all possible information. If you have questions about this medicine, talk to your doctor, pharmacist, or health care provider.  2023 [employer] Standard (2021-09-27 00:00:00) Fluorouracil Injection What is this medication? FLUOROURACIL (flure oh YOOR a sil) treats some types of cancer. It works by slowing down the growth of cancer cells. This medicine may be used for other purposes; ask your health care provider or pharmacist if you have questions. COMMON BRAND NAME(S): Adrucil What should I tell my care team before I take this medication? They need to know if you have any of these conditions: Blood disorders Dihydropyrimidine dehydrogenase (DPD) deficiency Infection, such as chickenpox, cold sores, herpes Kidney disease Liver disease Poor nutrition Recent or ongoing radiation therapy An unusual or allergic reaction to fluorouracil, other medications, foods, dyes,  or preservatives If you or your partner are pregnant or trying to get pregnant Breast-feeding How should I use this medication? This medication is injected into a vein. It is  administered by your care team in a hospital or clinic setting. Talk to your care team about the use of this medication in children. Special care may be needed. Overdosage: If you think you have taken too much of this medicine contact a poison control center or emergency room at once. NOTE: This medicine is only for you. Do not share this medicine with others. What if I miss a dose? Keep appointments for follow-up doses. It is important not to miss your dose. Call your care team if you are unable to keep an appointment. What may interact with this medication? Do not take this medication with any of the following: Live virus vaccines This medication may also interact with the following: Medications that treat or prevent blood clots, such as warfarin, enoxaparin, dalteparin This list may not describe all possible interactions. Give your health care provider a list of all the medicines, herbs, non-prescription drugs, or dietary supplements you use. Also tell them if you smoke, drink alcohol, or use illegal drugs. Some items may interact with your medicine. What should I watch for while using this medication? Your condition will be monitored carefully while you are receiving this medication. This medication may make you feel generally unwell. This is not uncommon as chemotherapy can affect healthy cells as well as cancer cells. Report any side effects. Continue your course of treatment even though you feel ill unless your care team tells you to stop. In some cases, you may be given additional medications to help with side effects. Follow all directions for their use. This medication may increase your risk of getting an infection. Call your care team for advice if you get a fever, chills, sore throat, or other symptoms of a cold or flu. Do not treat yourself. Try to avoid being around people who are sick. This medication may increase your risk to bruise or bleed. Call your care team if you notice any  unusual bleeding. Be careful brushing or flossing your teeth or using a toothpick because you may get an infection or bleed more easily. If you have any dental work done, tell your dentist you are receiving this medication. Avoid taking medications that contain aspirin, acetaminophen, ibuprofen, naproxen, or ketoprofen unless instructed by your care team. These medications may hide a fever. Do not treat diarrhea with over the counter products. Contact your care team if you have diarrhea that lasts more than 2 days or if it is severe and watery. This medication can make you more sensitive to the sun. Keep out of the sun. If you cannot avoid being in the sun, wear protective clothing and sunscreen. Do not use sun lamps, tanning beds, or tanning booths. Talk to your care team if you or your partner wish to become pregnant or think you might be pregnant. This medication can cause serious birth defects if taken during pregnancy and for 3 months after the last dose. A reliable form of contraception is recommended while taking this medication and for 3 months after the last dose. Talk to your care team about effective forms of contraception. Do not father a child while taking this medication and for 3 months after the last dose. Use a condom while having sex during this time period. Do not breastfeed while taking this medication. This medication may cause infertility. Talk to  your care team if you are concerned about your fertility. What side effects may I notice from receiving this medication? Side effects that you should report to your care team as soon as possible: Allergic reactions--skin rash, itching, hives, swelling of the face, lips, tongue, or throat Heart attack--pain or tightness in the chest, shoulders, arms, or jaw, nausea, shortness of breath, cold or clammy skin, feeling faint or lightheaded Heart failure--shortness of breath, swelling of the ankles, feet, or hands, sudden weight gain, unusual  weakness or fatigue Heart rhythm changes--fast or irregular heartbeat, dizziness, feeling faint or lightheaded, chest pain, trouble breathing High ammonia level--unusual weakness or fatigue, confusion, loss of appetite, nausea, vomiting, seizures Infection--fever, chills, cough, sore throat, wounds that don't heal, pain or trouble when passing urine, general feeling of discomfort or being unwell Low red blood cell level--unusual weakness or fatigue, dizziness, headache, trouble breathing Pain, tingling, or numbness in the hands or feet, muscle weakness, change in vision, confusion or trouble speaking, loss of balance or coordination, trouble walking, seizures Redness, swelling, and blistering of the skin over hands and feet Severe or prolonged diarrhea Unusual bruising or bleeding Side effects that usually do not require medical attention (report to your care team if they continue or are bothersome): Dry skin Headache Increased tears Nausea Pain, redness, or swelling with sores inside the mouth or throat Sensitivity to light Vomiting This list may not describe all possible side effects. Call your doctor for medical advice about side effects. You may report side effects to FDA at 1-800-FDA-1088. Where should I keep my medication? This medication is given in a hospital or clinic. It will not be stored at home. NOTE: This sheet is a summary. It may not cover all possible information. If you have questions about this medicine, talk to your doctor, pharmacist, or health care provider.  2023 Elsevier/Gold Standard (2021-08-23 00:00:00) Bevacizumab Injection What is this medication? BEVACIZUMAB (be va SIZ yoo mab) treats some types of cancer. It works by blocking a protein that causes cancer cells to grow and multiply. This helps to slow or stop the spread of cancer cells. It is a monoclonal antibody. This medicine may be used for other purposes; ask your health care provider or pharmacist if you  have questions. COMMON BRAND NAME(S): Alymsys, Avastin, MVASI, Noah Charon What should I tell my care team before I take this medication? They need to know if you have any of these conditions: Blood clots Coughing up blood Having or recent surgery Heart failure High blood pressure History of a connection between 2 or more body parts that do not usually connect (fistula) History of a tear in your stomach or intestines Protein in your urine An unusual or allergic reaction to bevacizumab, other medications, foods, dyes, or preservatives Pregnant or trying to get pregnant Breast-feeding How should I use this medication? This medication is injected into a vein. It is given by your care team in a hospital or clinic setting. Talk to your care team the use of this medication in children. Special care may be needed. Overdosage: If you think you have taken too much of this medicine contact a poison control center or emergency room at once. NOTE: This medicine is only for you. Do not share this medicine with others. What if I miss a dose? Keep appointments for follow-up doses. It is important not to miss your dose. Call your care team if you are unable to keep an appointment. What may interact with this medication? Interactions are not  expected. This list may not describe all possible interactions. Give your health care provider a list of all the medicines, herbs, non-prescription drugs, or dietary supplements you use. Also tell them if you smoke, drink alcohol, or use illegal drugs. Some items may interact with your medicine. What should I watch for while using this medication? Your condition will be monitored carefully while you are receiving this medication. You may need blood work while taking this medication. This medication may make you feel generally unwell. This is not uncommon as chemotherapy can affect healthy cells as well as cancer cells. Report any side effects. Continue your course of  treatment even though you feel ill unless your care team tells you to stop. This medication may increase your risk to bruise or bleed. Call your care team if you notice any unusual bleeding. Before having surgery, talk to your care team to make sure it is ok. This medication can increase the risk of poor healing of your surgical site or wound. You will need to stop this medication for 28 days before surgery. After surgery, wait at least 28 days before restarting this medication. Make sure the surgical site or wound is healed enough before restarting this medication. Talk to your care team if questions. Talk to your care team if you may be pregnant. Serious birth defects can occur if you take this medication during pregnancy and for 6 months after the last dose. Contraception is recommended while taking this medication and for 6 months after the last dose. Your care team can help you find the option that works for you. Do not breastfeed while taking this medication and for 6 months after the last dose. This medication can cause infertility. Talk to your care team if you are concerned about your fertility. What side effects may I notice from receiving this medication? Side effects that you should report to your care team as soon as possible: Allergic reactions--skin rash, itching, hives, swelling of the face, lips, tongue, or throat Bleeding--bloody or black, tar-like stools, vomiting blood or brown material that looks like coffee grounds, red or dark brown urine, small red or purple spots on skin, unusual bruising or bleeding Blood clot--pain, swelling, or warmth in the leg, shortness of breath, chest pain Heart attack--pain or tightness in the chest, shoulders, arms, or jaw, nausea, shortness of breath, cold or clammy skin, feeling faint or lightheaded Heart failure--shortness of breath, swelling of the ankles, feet, or hands, sudden weight gain, unusual weakness or fatigue Increase in blood  pressure Infection--fever, chills, cough, sore throat, wounds that don't heal, pain or trouble when passing urine, general feeling of discomfort or being unwell Infusion reactions--chest pain, shortness of breath or trouble breathing, feeling faint or lightheaded Kidney injury--decrease in the amount of urine, swelling of the ankles, hands, or feet Stomach pain that is severe, does not go away, or gets worse Stroke--sudden numbness or weakness of the face, arm, or leg, trouble speaking, confusion, trouble walking, loss of balance or coordination, dizziness, severe headache, change in vision Sudden and severe headache, confusion, change in vision, seizures, which may be signs of posterior reversible encephalopathy syndrome (PRES) Side effects that usually do not require medical attention (report to your care team if they continue or are bothersome): Back pain Change in taste Diarrhea Dry skin Increased tears Nosebleed This list may not describe all possible side effects. Call your doctor for medical advice about side effects. You may report side effects to FDA at 1-800-FDA-1088. Where should I  keep my medication? This medication is given in a hospital or clinic. It will not be stored at home. NOTE: This sheet is a summary. It may not cover all possible information. If you have questions about this medicine, talk to your doctor, pharmacist, or health care provider.  2023 [employer]/Gold Standard (2021-08-26 00:00:00) Irinotecan Injection What is this medication? IRINOTECAN (ir in oh TEE kan) treats some types of cancer. It works by slowing down the growth of cancer cells. This medicine may be used for other purposes; ask your health care provider or pharmacist if you have questions. COMMON BRAND NAME(S): Camptosar What should I tell my care team before I take this medication? They need to know if you have any of these conditions: Dehydration Diarrhea Infection, especially a viral infection,  such as chickenpox, cold sores, herpes Liver disease Low blood cell levels (white cells, red cells, and platelets) Low levels of electrolytes, such as calcium, magnesium, or potassium in your blood Recent or ongoing radiation An unusual or allergic reaction to irinotecan, other medications, foods, dyes, or preservatives If you or your partner are pregnant or trying to get pregnant Breast-feeding How should I use this medication? This medication is injected into a vein. It is given by your care team in a hospital or clinic setting. Talk to your care team about the use of this medication in children. Special care may be needed. Overdosage: If you think you have taken too much of this medicine contact a poison control center or emergency room at once. NOTE: This medicine is only for you. Do not share this medicine with others. What if I miss a dose? Keep appointments for follow-up doses. It is important not to miss your dose. Call your care team if you are unable to keep an appointment. What may interact with this medication? Do not take this medication with any of the following: Cobicistat Itraconazole This medication may also interact with the following: Certain antibiotics, such as clarithromycin, rifampin, rifabutin Certain antivirals for HIV or AIDS Certain medications for fungal infections, such as ketoconazole, posaconazole, voriconazole Certain medications for seizures, such as carbamazepine, phenobarbital, phenytoin Gemfibrozil Nefazodone St. John's wort This list may not describe all possible interactions. Give your health care provider a list of all the medicines, herbs, non-prescription drugs, or dietary supplements you use. Also tell them if you smoke, drink alcohol, or use illegal drugs. Some items may interact with your medicine. What should I watch for while using this medication? Your condition will be monitored carefully while you are receiving this medication. You may  need blood work while taking this medication. This medication may make you feel generally unwell. This is not uncommon as chemotherapy can affect healthy cells as well as cancer cells. Report any side effects. Continue your course of treatment even though you feel ill unless your care team tells you to stop. This medication can cause serious side effects. To reduce the risk, your care team may give you other medications to take before receiving this one. Be sure to follow the directions from your care team. This medication may affect your coordination, reaction time, or judgement. Do not drive or operate machinery until you know how this medication affects you. Sit up or stand slowly to reduce the risk of dizzy or fainting spells. Drinking alcohol with this medication can increase the risk of these side effects. This medication may increase your risk of getting an infection. Call your care team for advice if you get a fever, chills,  sore throat, or other symptoms of a cold or flu. Do not treat yourself. Try to avoid being around people who are sick. Avoid taking medications that contain aspirin, acetaminophen, ibuprofen, naproxen, or ketoprofen unless instructed by your care team. These medications may hide a fever. This medication may increase your risk to bruise or bleed. Call your care team if you notice any unusual bleeding. Be careful brushing or flossing your teeth or using a toothpick because you may get an infection or bleed more easily. If you have any dental work done, tell your dentist you are receiving this medication. Talk to your care team if you or your partner are pregnant or think either of you might be pregnant. This medication can cause serious birth defects if taken during pregnancy and for 6 months after the last dose. You will need a negative pregnancy test before starting this medication. Contraception is recommended while taking this medication and for 6 months after the last dose.  Your care team can help you find the option that works for you. Do not father a child while taking this medication and for 3 months after the last dose. Use a condom for contraception during this time period. Do not breastfeed while taking this medication and for 7 days after the last dose. This medication may cause infertility. Talk to your care team if you are concerned about your fertility. What side effects may I notice from receiving this medication? Side effects that you should report to your care team as soon as possible: Allergic reactions--skin rash, itching, hives, swelling of the face, lips, tongue, or throat Dry cough, shortness of breath or trouble breathing Increased saliva or tears, increased sweating, stomach cramping, diarrhea, small pupils, unusual weakness or fatigue, slow heartbeat Infection--fever, chills, cough, sore throat, wounds that don't heal, pain or trouble when passing urine, general feeling of discomfort or being unwell Kidney injury--decrease in the amount of urine, swelling of the ankles, hands, or feet Low red blood cell level--unusual weakness or fatigue, dizziness, headache, trouble breathing Severe or prolonged diarrhea Unusual bruising or bleeding Side effects that usually do not require medical attention (report to your care team if they continue or are bothersome): Constipation Diarrhea Hair loss Loss of appetite Nausea Stomach pain This list may not describe all possible side effects. Call your doctor for medical advice about side effects. You may report side effects to FDA at 1-800-FDA-1088. Where should I keep my medication? This medication is given in a hospital or clinic. It will not be stored at home. NOTE: This sheet is a summary. It may not cover all possible information. If you have questions about this medicine, talk to your doctor, pharmacist, or health care provider.  2023 [employer] Standard (2021-09-01 00:00:00)

## 2022-04-05 ENCOUNTER — Encounter: Payer: Self-pay | Admitting: Oncology

## 2022-04-06 ENCOUNTER — Inpatient Hospital Stay: Payer: BC Managed Care – PPO

## 2022-04-06 DIAGNOSIS — Z5112 Encounter for antineoplastic immunotherapy: Secondary | ICD-10-CM | POA: Diagnosis not present

## 2022-04-06 DIAGNOSIS — C787 Secondary malignant neoplasm of liver and intrahepatic bile duct: Secondary | ICD-10-CM | POA: Diagnosis not present

## 2022-04-06 DIAGNOSIS — D500 Iron deficiency anemia secondary to blood loss (chronic): Secondary | ICD-10-CM | POA: Diagnosis not present

## 2022-04-06 DIAGNOSIS — Z5111 Encounter for antineoplastic chemotherapy: Secondary | ICD-10-CM | POA: Diagnosis not present

## 2022-04-06 DIAGNOSIS — J90 Pleural effusion, not elsewhere classified: Secondary | ICD-10-CM | POA: Diagnosis not present

## 2022-04-06 DIAGNOSIS — Z452 Encounter for adjustment and management of vascular access device: Secondary | ICD-10-CM | POA: Diagnosis not present

## 2022-04-06 DIAGNOSIS — C187 Malignant neoplasm of sigmoid colon: Secondary | ICD-10-CM | POA: Diagnosis not present

## 2022-04-06 MED ORDER — HEPARIN SOD (PORK) LOCK FLUSH 100 UNIT/ML IV SOLN
500.0000 [IU] | Freq: Once | INTRAVENOUS | Status: AC | PRN
  Administered 2022-04-06: 500 [IU]

## 2022-04-06 MED ORDER — SODIUM CHLORIDE 0.9% FLUSH
10.0000 mL | INTRAVENOUS | Status: DC | PRN
  Administered 2022-04-06: 10 mL

## 2022-04-06 NOTE — Patient Instructions (Signed)
Fluorouracil Injection What is this medication? FLUOROURACIL (flure oh YOOR a sil) treats some types of cancer. It works by slowing down the growth of cancer cells. This medicine may be used for other purposes; ask your health care provider or pharmacist if you have questions. COMMON BRAND NAME(S): Adrucil What should I tell my care team before I take this medication? They need to know if you have any of these conditions: Blood disorders Dihydropyrimidine dehydrogenase (DPD) deficiency Infection, such as chickenpox, cold sores, herpes Kidney disease Liver disease Poor nutrition Recent or ongoing radiation therapy An unusual or allergic reaction to fluorouracil, other medications, foods, dyes, or preservatives If you or your partner are pregnant or trying to get pregnant Breast-feeding How should I use this medication? This medication is injected into a vein. It is administered by your care team in a hospital or clinic setting. Talk to your care team about the use of this medication in children. Special care may be needed. Overdosage: If you think you have taken too much of this medicine contact a poison control center or emergency room at once. NOTE: This medicine is only for you. Do not share this medicine with others. What if I miss a dose? Keep appointments for follow-up doses. It is important not to miss your dose. Call your care team if you are unable to keep an appointment. What may interact with this medication? Do not take this medication with any of the following: Live virus vaccines This medication may also interact with the following: Medications that treat or prevent blood clots, such as warfarin, enoxaparin, dalteparin This list may not describe all possible interactions. Give your health care provider a list of all the medicines, herbs, non-prescription drugs, or dietary supplements you use. Also tell them if you smoke, drink alcohol, or use illegal drugs. Some items may  interact with your medicine. What should I watch for while using this medication? Your condition will be monitored carefully while you are receiving this medication. This medication may make you feel generally unwell. This is not uncommon as chemotherapy can affect healthy cells as well as cancer cells. Report any side effects. Continue your course of treatment even though you feel ill unless your care team tells you to stop. In some cases, you may be given additional medications to help with side effects. Follow all directions for their use. This medication may increase your risk of getting an infection. Call your care team for advice if you get a fever, chills, sore throat, or other symptoms of a cold or flu. Do not treat yourself. Try to avoid being around people who are sick. This medication may increase your risk to bruise or bleed. Call your care team if you notice any unusual bleeding. Be careful brushing or flossing your teeth or using a toothpick because you may get an infection or bleed more easily. If you have any dental work done, tell your dentist you are receiving this medication. Avoid taking medications that contain aspirin, acetaminophen, ibuprofen, naproxen, or ketoprofen unless instructed by your care team. These medications may hide a fever. Do not treat diarrhea with over the counter products. Contact your care team if you have diarrhea that lasts more than 2 days or if it is severe and watery. This medication can make you more sensitive to the sun. Keep out of the sun. If you cannot avoid being in the sun, wear protective clothing and sunscreen. Do not use sun lamps, tanning beds, or tanning booths. Talk to   your care team if you or your partner wish to become pregnant or think you might be pregnant. This medication can cause serious birth defects if taken during pregnancy and for 3 months after the last dose. A reliable form of contraception is recommended while taking this  medication and for 3 months after the last dose. Talk to your care team about effective forms of contraception. Do not father a child while taking this medication and for 3 months after the last dose. Use a condom while having sex during this time period. Do not breastfeed while taking this medication. This medication may cause infertility. Talk to your care team if you are concerned about your fertility. What side effects may I notice from receiving this medication? Side effects that you should report to your care team as soon as possible: Allergic reactions--skin rash, itching, hives, swelling of the face, lips, tongue, or throat Heart attack--pain or tightness in the chest, shoulders, arms, or jaw, nausea, shortness of breath, cold or clammy skin, feeling faint or lightheaded Heart failure--shortness of breath, swelling of the ankles, feet, or hands, sudden weight gain, unusual weakness or fatigue Heart rhythm changes--fast or irregular heartbeat, dizziness, feeling faint or lightheaded, chest pain, trouble breathing High ammonia level--unusual weakness or fatigue, confusion, loss of appetite, nausea, vomiting, seizures Infection--fever, chills, cough, sore throat, wounds that don't heal, pain or trouble when passing urine, general feeling of discomfort or being unwell Low red blood cell level--unusual weakness or fatigue, dizziness, headache, trouble breathing Pain, tingling, or numbness in the hands or feet, muscle weakness, change in vision, confusion or trouble speaking, loss of balance or coordination, trouble walking, seizures Redness, swelling, and blistering of the skin over hands and feet Severe or prolonged diarrhea Unusual bruising or bleeding Side effects that usually do not require medical attention (report to your care team if they continue or are bothersome): Dry skin Headache Increased tears Nausea Pain, redness, or swelling with sores inside the mouth or throat Sensitivity  to light Vomiting This list may not describe all possible side effects. Call your doctor for medical advice about side effects. You may report side effects to FDA at 1-800-FDA-1088. Where should I keep my medication? This medication is given in a hospital or clinic. It will not be stored at home. NOTE: This sheet is a summary. It may not cover all possible information. If you have questions about this medicine, talk to your doctor, pharmacist, or health care provider.  2023 [employer] (2021-08-23 00:00:00)  

## 2022-04-08 ENCOUNTER — Other Ambulatory Visit: Payer: Self-pay | Admitting: Hematology and Oncology

## 2022-04-12 ENCOUNTER — Ambulatory Visit (HOSPITAL_COMMUNITY)
Admission: RE | Admit: 2022-04-12 | Discharge: 2022-04-12 | Disposition: A | Discharge status: Home / Self Care | Payer: BC Managed Care – PPO | Source: Ambulatory Visit | Attending: Oncology | Admitting: Oncology

## 2022-04-12 DIAGNOSIS — C187 Malignant neoplasm of sigmoid colon: Secondary | ICD-10-CM | POA: Diagnosis not present

## 2022-04-12 DIAGNOSIS — I700 Atherosclerosis of aorta: Secondary | ICD-10-CM | POA: Diagnosis not present

## 2022-04-12 DIAGNOSIS — Z9049 Acquired absence of other specified parts of digestive tract: Secondary | ICD-10-CM | POA: Diagnosis not present

## 2022-04-12 DIAGNOSIS — Z9889 Other specified postprocedural states: Secondary | ICD-10-CM | POA: Diagnosis not present

## 2022-04-12 DIAGNOSIS — C189 Malignant neoplasm of colon, unspecified: Secondary | ICD-10-CM | POA: Diagnosis not present

## 2022-04-12 MED ORDER — GADOBUTROL 1 MMOL/ML IV SOLN
10.0000 mL | Freq: Once | INTRAVENOUS | Status: AC | PRN
  Administered 2022-04-12: 10 mL via INTRAVENOUS

## 2022-04-13 NOTE — Progress Notes (Addendum)
[place]  [address] [place],  [place]  [address] [phone]  Clinic Day:  04/14/22  Referring physician: Angelina Sheriff, MD  ASSESSMENT & PLAN:   Assessment & Plan: Malignant neoplasm metastatic to liver [facility]) History of liver metastasis in January 2021 treated with resection alone.  He had recurrent liver metastasis treated with a partial hepatectomy in June of 2023.  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  There was 1 negative node but margins were close.  This was attached to the diaphragm but the tissue from the diaphragm is benign.  He had a complicated postoperative course developing an cyst requiring drainage.  He then developed a right pleural effusion in July treated with a VATS procedure. He did have postoperative complications including biliary leak and had ERCP with placement of an endoscopic stent into the biliary duct.  He was readmitted on June 27 with abscess and sepsis and perihepatic fluid collection that required placement of a drain. He is receiving chemotherapy with FOLFIRI and Avastin, which he started in August.  His third cycle of chemotherapy was delayed as he was admitted with sepsis.  He completed IV antibiotics as an outpatient. CT abdomen pelvis from 01/07/2022 showed 12 mm short axis lymph node identified in the region of the porta hepatis. His MRI abdomen in early December at [facility] reveals nearly complete right hepatectomy without residual or recurrent metastatic disease, and no evidence of extrahepatic abdominal metastases.   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  He was treated with surgical resection followed by 6 months of adjuvant chemotherapy with FOLFOX. He had ascites and peritoneal nodules in 2018 with negative biopsy and negative fluid cytology.  He was taken for exploratory laparotomy with removal of the  nodule which is found to be benign.  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  We discussed postoperative chemotherapy, but ultimately decided on surveillance.     Iron deficiency anemia due to chronic blood loss Microcytic anemia, which is stable. He denies any overt form of blood loss. Iron studies were equivocal in July. His HGB today is stable at 13.1. He can stop his ferrous sulfate once he finishes this bottle.    Plan: We will continue his FOLFIRI/bevacizumab cycle 8 on 04/04/22.  Repeat MRI abdomen in early December 2023 reveals nearly complete right hepatectomy without residual or recurrent metastatic disease, and no evidence of extrahepatic abdominal metastases.  He will return in 2 weeks with CBC and CMP for his next cycle. The patient understands the plans discussed today and is in agreement with them.  He knows to contact our office if he develops concerns prior to his next appointment.  We discussed the fact that we will stop his FOLFIRI/bevacizumab after 12 cycles but continue a maintenance treatment, most likely just dropping the irinotecan. We will plan a PET scan 1 month after his 12th cycle to make sure he has no recurrent metastases.    I provided 30 minutes of face-to-face time during this encounter and > 50% was spent counseling as documented under my assessment and plan.    Derwood Kaplan, MD  [facility] AT [facility] [address] [place] [address] Dept: [phone] Dept Fax: [phone]   Orders Placed This Encounter  Procedures  CBC and differential    This external order was created through the Results Console.   CBC    This external order was created through the Results Console.      CHIEF COMPLAINT:  CC: Recurrent colon cancer  Current Treatment: FOLFIRI/bevacizumab  HISTORY OF PRESENT ILLNESS:  The patient is a 51 y.o.  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  He presented with fevers and chills in September 2017 and was found to have diverticulitis, which was treated.  CT at that time revealed a 1.7 cm lesion in the liver, which was nonspecific, as well as ectopic left kidney with a cyst.  He was then brought back for a colonoscopy, which revealed a 3.5 cm mass in the sigmoid.  Biopsy revealed high-grade dysplasia.  CT abdomen and pelvis revealed an indeterminate lesion in the liver in addition to wall thickening in the sigmoid colon.  MRI abdomen revealed the lesion in the liver to represent complex cyst versus vascular lesion, however, follow-up in 6 months was recommended.  Air contrast barium enema revealed an apple-core lesion of the sigmoid colon.  Baseline CEA was 0.6.  He underwent surgical resection in November 2017.  Pathology revealed a 4 cm, grade 2, adenocarcinoma of the sigmoid colon, as well as chronic active diverticulitis with an area of perforation, however, the tumor was not perforated.  Tumor invaded through the muscularis propria into the pericolorectal tissues.  4/21 nodes were positive for metastasis.  Margins were clear.  MMR and MSI were normal.  KRAS and BRAF mutations were negative.  He had iron deficiency treated with oral iron supplement in the form of Hemocyte daily.  Due to his age of diagnosis and family history he underwent testing for hereditary non polyposis colorectal cancer with the Myriad myRisk Hereditary Cancer Panel test.  This did not reveal any clinically significant mutation or variants of uncertain significance.  Repeat MRI abdomen in January 2018 revealed a stable lesion within the right lobe of the liver, most consistent with benign lesion.  There was new small volume ascites seen.  The patient received adjuvant chemotherapy with FOLFOX  for 12 cycles, which was completed in June.  He tolerated treatment fairly well, except for mild neuropathy with paresthesias  and numbness in his feet.  He was placed on gabapentin in July, then noticed abdominal bloating, which he attributed to the gabapentin, so he discontinued that after 1 month.  He was seen for routine follow-up in September 2018 with a repeat MRI abdomen to re-evaluate the liver lesion.  That revealed a large volume ascites with findings suspicious for peritoneal disease.  The lesion in the right lobe of the liver was stable and still assistant with a benign lesion.  The patient underwent paracentesis with removal of 4.7 L of fluid, but fluid cytology was negative for malignancy.  PET scan in early October did not reveal any hypermetabolic activity, but moderate ascites was seen.  Ultrasound-guided peritoneal biopsy was negative for malignancy, revealing adipose tissue with fibrosis, patchy inflammation and minimal atypia.  His case was discussed at tumor conference and he was referred to Dr. Noberto Retort for consideration open biopsy.  Dr. Noberto Retort performed colonoscopy in October 2018, which was negative.  Follow-up colonoscopy in 3 years was recommended.  The patient then underwent exploratory laparotomy with multiple peritoneal biopsies.  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  We therefore recommended observation for the patient.  In November 2018, he was seen in the emergency room with abdominal pain, nausea and vomiting.  Gallbladder ultrasound revealed cholelithiasis with sludge and polyps, as well as mild gallbladder wall thickening.  Mild ascites was also seen.  CT abdomen and pelvis revealed mild diffuse gallbladder wall edema otherwise unchanged from PET-CT done in October.  He underwent cholecystectomy with findings of acute cholecystitis.  Pathology did not reveal any evidence of malignancy.    He was seen off schedule in April 2019, as he presented to Dr. Janace Aris office with  abdominal pain and was found to have an elevated bilirubin and liver transaminases.  Due to the laboratory abnormalities, he underwent repeat MRI abdomen at that time, which revealed a stable lesion in the right hepatic lobe, which was felt to be indeterminate, with a 2nd smaller indeterminate lesion in the right hepatic lobe measuring 12 mm, which was new from previous imaging.  No hepatic steatosis or ascites was seen.  CEA was normal.  Hepatitis panel and CMV were negative  we felt the laboratory abnormalities were most likely secondary to viral illness.  He was followed closely and underwent a repeat MRI abdomen in June, which remained stable. He had one again in December of 2019, which was stable.  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  MRI abdomen in January 2021 revealed the mass in the junction of segments 7 and 8 in the liver has increased in size compared to the prior exam, currently 2.9 x 2.1 x 2.5 cm.  The lesion has a branching component, some of which may be from portal vein thrombus or tumor thrombus, even this branching component appears to enhance on subtraction images.   Biopsy in February revealed adenocarcinoma with necrosis, consistent with metastasis from colon primary.  He underwent surgical resection in February with Dr. Rolla Etienne at [facility].  We discussed the option of further chemotherapy, including the risks and benefits, and he opted for surveillance.  MRI abdomen in May 2023 revealed another solitary metastasis in the liver.  Treated with partial hepatectomy in June with Dr. Cecil Cobbs.  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  There was 1 negative node, but margins were close.   This was attached to the diaphragm, but the tissue from the diaphragm was benign.  He had a complicated postoperative course developing an cyst requiring drainage.  He then developed a right pleural effusion in July treated with a VATS procedure.  He is receiving chemotherapy with FOLFIRI and Avastin, which he started in August   Oncology History  Colon cancer [facility])  01/23/2016 Cancer Staging   Staging form: Colon and Rectum, AJCC 7th Edition - Clinical stage from 01/23/2016: Stage IIIB (T3, N2a, M0) - Signed by Derwood Kaplan, MD on 05/03/2020 Staging comments: 3.5 cm MMR, MSI normal, No mutation of KRAS or BRAF, neg.genetics Rec'd 6 months adjuvant FOLFOX chemotherapy   02/02/2016 Initial Diagnosis   Colon cancer ([place])   12/13/2021 - 12/29/2021 Chemotherapy   Patient is on Treatment Plan : COLORECTAL FOLFIRI / BEVACIZUMAB Q14D     12/13/2021 -  Chemotherapy   Patient is on Treatment Plan : COLORECTAL FOLFIRI +  Bevacizumab q14d     Malignant neoplasm metastatic to liver ([place])  05/29/2019 Initial Diagnosis   Malignant neoplasm metastatic to liver ([place])   12/13/2021 - 12/29/2021 Chemotherapy   Patient is on Treatment Plan : COLORECTAL FOLFIRI / BEVACIZUMAB Q14D     12/13/2021 -  Chemotherapy   Patient is on Treatment Plan : COLORECTAL FOLFIRI + Bevacizumab q14d     01/07/2022 Imaging   CT abdomen and pelvis:  IMPRESSION:  1. Interval right hepatectomy with surgical changes in the dome of  the left liver.  2. 12 mm short axis lymph node identified in the region of the porta  hepatis. Attention on follow-up recommended.  3. Left pelvic kidney with large simple appearing cyst measuring  12.5 cm, increased from 7.8 cm on 03/14/2017.  4. No acute findings in the abdomen or pelvis. Specifically, no  findings to explain the patient's history of abdominal pain and  fever.        INTERVAL HISTORY:  Josue is here today for repeat clinical assessment prior to his 9th cycle of  FOLFIRI/bevacizumab for recurrent liver metastases. He reports that he is doing well overall.  He feels fatigued for a few days after treatment but has been active in his yard without much difficulty. We reviewed his CBC today which showed WBC of 3.6, ANC normal at 1.84, and his HGB at 13.1. He states that he has been compliant his medications including iron supplementation and B12. He had a repeat MRI abdomen in early December at [facility].This reveals nearly complete right hepatectomy without residual or recurrent metastatic disease, and no evidence of extrahepatic abdominal metastases. He denies signs of infection such as sore throat, sinus drainage, cough, or urinary symptoms.  He denies fevers or recurrent chills. He denies pain. He denies nausea, vomiting, chest pain, dyspnea or cough. His weight has increased 4 pounds over the last 2 weeks. He is currently on short-term disability and is transitioning to long-term on 04/18/22.  REVIEW OF SYSTEMS:  Review of Systems  Constitutional:  Positive for fatigue. Negative for appetite change, chills, fever and unexpected weight change.  HENT:  Negative.  Negative for lump/mass, mouth sores and sore throat.   Eyes: Negative.   Respiratory: Negative.  Negative for chest tightness, cough, hemoptysis, shortness of breath and wheezing.   Cardiovascular: Negative.  Negative for chest pain, leg swelling and palpitations.  Gastrointestinal: Negative.  Negative for abdominal distention, abdominal pain, blood in stool, constipation, diarrhea, nausea and vomiting.  Endocrine: Negative.  Negative for hot flashes.  Genitourinary: Negative.  Negative for difficulty urinating, dysuria, frequency and hematuria.   Musculoskeletal:  Negative for arthralgias, back pain, flank pain and gait problem.  Skin: Negative.   Neurological: Negative.  Negative for dizziness, extremity weakness, gait problem, headaches, light-headedness, numbness, seizures and speech difficulty.   Hematological: Negative.  Negative for adenopathy. Does not bruise/bleed easily.  Psychiatric/Behavioral: Negative.  Negative for depression and sleep disturbance. The patient is not nervous/anxious.      VITALS:  Blood pressure (!) 159/108, pulse 86, temperature 98.1 F (36.7 C), temperature source Oral, resp. rate 18, height _0  (1.981 m), weight 271 lb 6.4 oz (123.1 kg), SpO2 100 %.  Wt Readings from Last 3 Encounters:  05/12/22 273 lb (123.8 kg)  05/04/22 277 lb (125.6 kg)  05/02/22 272 lb 0.6 oz (123.4 kg)    Body mass index is 31.36 kg/m.  Performance status (ECOG): 1 - Symptomatic but completely ambulatory  PHYSICAL EXAM:  Physical  Exam Vitals and nursing note reviewed.  Constitutional:      General: He is not in acute distress.    Appearance: Normal appearance. He is normal weight. He is not ill-appearing or toxic-appearing.  HENT:     Head: Normocephalic and atraumatic.     Mouth/Throat:     Mouth: Mucous membranes are moist.     Pharynx: Oropharynx is clear. No oropharyngeal exudate or posterior oropharyngeal erythema.  Eyes:     General: No scleral icterus.    Extraocular Movements: Extraocular movements intact.     Conjunctiva/sclera: Conjunctivae normal.     Pupils: Pupils are equal, round, and reactive to light.  Cardiovascular:     Rate and Rhythm: Normal rate and regular rhythm.     Pulses: Normal pulses.     Heart sounds: Normal heart sounds. No murmur heard.    No friction rub. No gallop.  Pulmonary:     Effort: Pulmonary effort is normal. No respiratory distress.     Breath sounds: Normal breath sounds. No wheezing, rhonchi or rales.  Abdominal:     General: Bowel sounds are normal. There is no distension.     Palpations: Abdomen is soft. There is no hepatomegaly, splenomegaly or mass.     Tenderness: There is no abdominal tenderness.  Musculoskeletal:        General: No swelling or tenderness. Normal range of motion.     Cervical back: Normal  range of motion and neck supple. No tenderness.     Right lower leg: No edema.     Left lower leg: No edema.  Lymphadenopathy:     Cervical: No cervical adenopathy.     Upper Body:     Right upper body: No supraclavicular or axillary adenopathy.     Left upper body: No supraclavicular or axillary adenopathy.     Lower Body: No right inguinal adenopathy. No left inguinal adenopathy.  Skin:    General: Skin is warm and dry.     Coloration: Skin is not jaundiced.     Findings: No rash.  Neurological:     Mental Status: He is alert and oriented to person, place, and time.     Cranial Nerves: No cranial nerve deficit.  Psychiatric:        Mood and Affect: Mood normal.        Behavior: Behavior normal.        Thought Content: Thought content normal.     LABS:      Latest Ref Rng & Units 05/12/2022   12:00 AM 04/28/2022   12:00 AM 04/14/2022   12:00 AM  CBC  WBC  3.8     3.8     3.6      Hemoglobin 13.5 - 17.5 13.4     14.2     13.1      Hematocrit 41 - 53 39     40     39      Platelets 150 - 400 K/uL 203     234     205         This result is from an external source.      Latest Ref Rng & Units 04/28/2022   12:00 AM 04/14/2022    2:41 PM 03/29/2022    1:02 PM  CMP  Glucose 70 - 99 mg/dL  116  105   BUN 4 - _0 Creatinine 0.6 - 1.3  1.0     1.09  0.95   Sodium 137 - 147 138     141  133   Potassium 3.5 - 5.1 mEq/L 4.5     4.3  4.2   Chloride 99 - 108 101     105  99   CO2 13 - _0 Calcium 8.7 - 10.7 8.9     8.9  9.2   Total Protein 6.5 - 8.1 g/dL  6.0  7.1   Total Bilirubin 0.3 - 1.2 mg/dL  0.6  0.7   Alkaline Phos 25 - 125 70     63  64   AST 14 - 40 33     19  18   ALT 10 - 40 U/L _1 This result is from an external source.     Lab Results  Component Value Date   CEA1 0.8 01/06/2022   /  CEA  Date Value Ref Range Status  01/06/2022 0.8 0.0 - 4.7 ng/mL Final    Comment:    (NOTE)                              Nonsmokers          <3.9                             Smokers             <5.6 [employer] Electrochemiluminescence Immunoassay (ECLIA) Values obtained with different assay methods or kits cannot be used interchangeably.  Results cannot be interpreted as absolute evidence of the presence or absence of malignant disease. Performed At: [facility] [place], [place] [id number] Rush Farmer MD AY:[id number]    No results found for: "PSA1" No results found for: "CAN199" No results found for: "CAN125"  No results found for: "TOTALPROTELP", "ALBUMINELP", "A1GS", "A2GS", "BETS", "BETA2SER", "GAMS", "MSPIKE", "SPEI" Lab Results  Component Value Date   TIBC 225 (L) 11/22/2021   FERRITIN 292 11/22/2021   IRONPCTSAT 6 (L) 11/22/2021   No results found for: "LDH"  STUDIES:  No results found.   CT abdomen and pelvis 01/07/2022 IMPRESSION:  1. Interval right hepatectomy with surgical changes in the dome of  the left liver.  2. 12 mm short axis lymph node identified in the region of the porta  hepatis. Attention on follow-up recommended.  3. Left pelvic kidney with large simple appearing cyst measuring  12.5 cm, increased from 7.8 cm on 03/14/2017.  4. No acute findings in the abdomen or pelvis. Specifically, no  findings to explain the patient's history of abdominal pain and  fever.    NUCLEAR MEDICINE PET SKULL BASE TO THIGH 10/07/2021 TECHNIQUE: 14.0 mCi F-18 FDG was injected intravenously. Full-ring PET imaging was performed from the skull base to thigh after the radiotracer. CT data was obtained and used for attenuation correction and anatomic localization.   Fasting blood glucose: 110 mg/dl   COMPARISON:  MRI 09/29/2021 and PET-CT 05/30/2019. Calcified granuloma identified in the right middle lobe, image 44/7.   FINDINGS: Mediastinal blood pool activity: SUV max 2.86   Liver activity: SUV max NA   NECK: No hypermetabolic lymph nodes in the  neck.   Incidental CT findings: none   CHEST: No hypermetabolic mediastinal  or hilar nodes. No suspicious pulmonary nodules on the CT scan.   Incidental CT findings: none   ABDOMEN/PELVIS: Tracer avid lesion within the dome of liver has an SUV max of 6.41, image 107 of the fused PET-CT images. This is not visible on the corresponding unenhanced CT images. On the PET-CT from 05/30/2019 this had an SUV max of 4.3. On the recent MRI of the abdomen this measured 2.2 x 2.5 x 2.7 cm. No additional abnormal foci of increased radiotracer uptake within the liver.   No abnormal uptake identified within the pancreas, spleen or adrenal glands. No tracer avid right abdominopelvic lymph nodes. Anastomotic suture line is identified at the rectosigmoid junction. No signs of locally recurrent tumor.   Incidental CT findings: Left pelvic kidney with large Bosniak class 1 exophytic cyst is again noted. No follow-up recommended.   SKELETON: No focal hypermetabolic activity to suggest skeletal metastasis.   Incidental CT findings: none   IMPRESSION: 1. Increased radiotracer uptake is identified within the dome of liver corresponding to the enhancing liver lesion on recent MRI of the abdomen. 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 2. No additional sites of FDG avid disease.   MRI ABDOMEN WITHOUT AND WITH CONTRAST 09/29/2021 TECHNIQUE: Multiplanar multisequence MR imaging of the abdomen was performed both before and after the administration of intravenous contrast.   CONTRAST:  78m GADAVIST GADOBUTROL 1 MMOL/ML IV SOLN   COMPARISON:  Abdominal MRI 12/30/2020.   FINDINGS: Lower chest: Unremarkable.   Hepatobiliary: Mild diffuse loss of signal intensity throughout the hepatic parenchyma, indicative of a background of mild hepatic steatosis. Again noted are areas of susceptibility artifact in the superior aspect of the right lobe of the liver related to  prior partial hepatectomy. 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 No other definite suspicious hepatic lesions are noted. No intra or extrahepatic biliary ductal dilatation. Status post cholecystectomy. Common bile duct measures 4 mm in the porta hepatis.   Pancreas: No pancreatic mass. No pancreatic ductal dilatation. No pancreatic or peripancreatic fluid collections or inflammatory changes.   Spleen:  Unremarkable.   Adrenals/Urinary Tract: Left kidney not visualized (previous imaging demonstrates a left pelvic kidney). Right kidney and bilateral adrenal glands are normal in appearance. No right hydroureteronephrosis in the visualized portions of the abdomen.   Stomach/Bowel: Visualized portions are unremarkable.   Vascular/Lymphatic: No aneurysm identified in the visualized abdominal vasculature. No lymphadenopathy noted in the abdomen.   Other: No significant volume of ascites noted in the visualized portions of the peritoneal cavity.   Musculoskeletal: No aggressive appearing osseous lesions are noted in the visualized portions of the skeleton.   IMPRESSION: 1. 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 No new sites of metastatic disease are otherwise noted.   These results will be called to the ordering clinician or representative by the Radiologist Assistant, and communication documented in the PACS or [employer]   HISTORY:   Past Medical History:  Diagnosis Date   GERD (gastroesophageal reflux disease)    Renal lithiasis     Past Surgical History:   Procedure Laterality Date   LIVER  RESECTION Right 07/02/2019   LIVER RESECTION Right 10/12/2021   SIGMOIDECTOMY  03/2016    Family History  Problem Relation Age of Onset   Lung cancer Father 35   Leukemia Father 38       acute myelogenous   Colon cancer Sister 68    Social History:  reports that he has quit smoking. He has never used smokeless tobacco. He reports that he does not currently use alcohol. No history on file for drug use.The patient is accompanied by his wife today.  Allergies: No Known Allergies  Current Medications: Current Outpatient Medications  Medication Sig Dispense Refill   ascorbic acid (VITAMIN C) 1000 MG tablet Take by mouth.     benzonatate (TESSALON) 200 MG capsule Take 1 capsule (200 mg total) by mouth 3 (three) times daily as needed for cough. (Patient not taking: Reported on 03/02/2022) 20 capsule 1   Calcium Polycarbophil (FIBER) 625 MG TABS Take by mouth.     cetirizine (ZYRTEC) 10 MG tablet Take by mouth.     Cholecalciferol (VITAMIN D) 50 MCG (2000 UT) tablet Take by mouth.     Docusate Sodium (DSS) 100 MG CAPS Take 1 capsule by mouth daily.     hydrocortisone (ANUSOL-HC) 25 MG suppository Place 1 suppository (25 mg total) rectally 2 (two) times daily. 12 suppository 1   lisinopril (ZESTRIL) 20 MG tablet Take 1 tablet (20 mg total) by mouth daily. 30 tablet 5   loperamide (IMODIUM) 2 MG capsule Take 1 capsule (2 mg total) by mouth as needed for diarrhea or loose stools. Take 2 at diarrhea onset , then 1 every 2hr until 12hrs with no BM. May take 2 every 4hrs at night. If diarrhea recurs repeat. 100 capsule 5   LORazepam (ATIVAN) 1 MG tablet Take 1 tablet (1 mg total) by mouth every 6 (six) hours as needed for anxiety or sleep. 100 tablet 0   magic mouthwash (nystatin, lidocaine, diphenhydrAMINE, alum & mag hydroxide) suspension SWISH AND/OR SWALLOW 5 ML BY MOUTH EVERY THREE HOURS IF NEEDED     Misc Natural Products (ELDERBERRY IMMUNE COMPLEX) CHEW  Chew 1 Dose by mouth daily.     Multiple Vitamin (MULTI-VITAMIN) tablet Take 1 tablet by mouth daily.     omeprazole (PRILOSEC) 20 MG capsule Take by mouth.     ondansetron (ZOFRAN) 8 MG tablet Take 1 tablet (8 mg total) by mouth 2 (two) times daily as needed for refractory nausea / vomiting. Start on day 3 after chemotherapy. 30 tablet 1   ondansetron (ZOFRAN-ODT) 4 MG disintegrating tablet Take 4 mg by mouth every 8 (eight) hours as needed. (Patient not taking: Reported on 03/02/2022)     oxyCODONE (OXY IR/ROXICODONE) 5 MG immediate release tablet Take by mouth.     Probiotic Product (PROBIOTIC BLEND PO) Take 1 tablet by mouth daily.     prochlorperazine (COMPAZINE) 10 MG tablet Take 1 tablet (10 mg total) by mouth every 6 (six) hours as needed (NAUSEA). (Patient not taking: Reported on 03/02/2022) 30 tablet 1   vitamin B-12 (CYANOCOBALAMIN) 250 MCG tablet Take by mouth.     zinc gluconate 50 MG tablet Take 50 mg by mouth daily.     No current facility-administered medications for this visit.   Facility-Administered Medications Ordered in Other Visits  Medication Dose Route Frequency Provider Last Rate Last Admin   atropine 1 MG/ML injection            palonosetron (ALOXI) 0.25 MG/5ML injection

## 2022-04-14 ENCOUNTER — Encounter: Payer: Self-pay | Admitting: Oncology

## 2022-04-14 ENCOUNTER — Inpatient Hospital Stay: Payer: BC Managed Care – PPO | Attending: Oncology | Admitting: Oncology

## 2022-04-14 ENCOUNTER — Inpatient Hospital Stay: Payer: BC Managed Care – PPO

## 2022-04-14 VITALS — BP 159/108 | HR 86 | Temp 98.1°F | Resp 18 | Ht 78.0 in | Wt 271.4 lb

## 2022-04-14 DIAGNOSIS — Z452 Encounter for adjustment and management of vascular access device: Secondary | ICD-10-CM | POA: Diagnosis not present

## 2022-04-14 DIAGNOSIS — Z5112 Encounter for antineoplastic immunotherapy: Secondary | ICD-10-CM | POA: Insufficient documentation

## 2022-04-14 DIAGNOSIS — C787 Secondary malignant neoplasm of liver and intrahepatic bile duct: Secondary | ICD-10-CM | POA: Diagnosis not present

## 2022-04-14 DIAGNOSIS — C187 Malignant neoplasm of sigmoid colon: Secondary | ICD-10-CM

## 2022-04-14 DIAGNOSIS — D500 Iron deficiency anemia secondary to blood loss (chronic): Secondary | ICD-10-CM | POA: Insufficient documentation

## 2022-04-14 DIAGNOSIS — K648 Other hemorrhoids: Secondary | ICD-10-CM | POA: Insufficient documentation

## 2022-04-14 DIAGNOSIS — Z5111 Encounter for antineoplastic chemotherapy: Secondary | ICD-10-CM | POA: Insufficient documentation

## 2022-04-14 DIAGNOSIS — C189 Malignant neoplasm of colon, unspecified: Secondary | ICD-10-CM | POA: Diagnosis not present

## 2022-04-14 LAB — CMP (CANCER CENTER ONLY)
ALT: 17 U/L (ref 0–44)
AST: 19 U/L (ref 15–41)
Albumin: 3.9 g/dL (ref 3.5–5.0)
Alkaline Phosphatase: 63 U/L (ref 38–126)
Anion gap: 10 (ref 5–15)
BUN: 8 mg/dL (ref 6–20)
CO2: 26 mmol/L (ref 22–32)
Calcium: 8.9 mg/dL (ref 8.9–10.3)
Chloride: 105 mmol/L (ref 98–111)
Creatinine: 1.09 mg/dL (ref 0.61–1.24)
GFR, Estimated: >60 mL/min (ref >60)
Glucose, Bld: 116 mg/dL — ABNORMAL HIGH (ref 70–99)
Potassium: 4.3 mmol/L (ref 3.5–5.1)
Sodium: 141 mmol/L (ref 135–145)
Total Bilirubin: 0.6 mg/dL (ref 0.3–1.2)
Total Protein: 6 g/dL — ABNORMAL LOW (ref 6.5–8.1)

## 2022-04-14 LAB — CBC AND DIFFERENTIAL
HCT: 39 — AB (ref 41–53)
Hemoglobin: 13.1 — AB (ref 13.5–17.5)
Neutrophils Absolute: 1.84
Platelets: 205 10*3/uL (ref 150–400)
WBC: 3.6

## 2022-04-14 LAB — CBC: RBC: 4.62 (ref 3.87–5.11)

## 2022-04-14 LAB — TOTAL PROTEIN, URINE DIPSTICK: Protein, ur: NEGATIVE mg/dL

## 2022-04-17 ENCOUNTER — Telehealth: Payer: Self-pay

## 2022-04-17 ENCOUNTER — Other Ambulatory Visit: Payer: Self-pay

## 2022-04-17 ENCOUNTER — Encounter: Payer: Self-pay | Admitting: Oncology

## 2022-04-17 MED FILL — Fluorouracil IV Soln 2.5 GM/50ML (50 MG/ML): INTRAVENOUS | Qty: 20 | Status: AC

## 2022-04-17 MED FILL — Irinotecan HCl Inj 100 MG/5ML (20 MG/ML): INTRAVENOUS | Qty: 23 | Status: AC

## 2022-04-17 MED FILL — Bevacizumab-awwb IV Soln 400 MG/16ML (For Infusion): INTRAVENOUS | Qty: 24 | Status: AC

## 2022-04-17 MED FILL — Leucovorin Calcium For Inj 350 MG: INTRAMUSCULAR | Qty: 51 | Status: AC

## 2022-04-17 MED FILL — Fluorouracil IV Soln 5 GM/100ML (50 MG/ML): INTRAVENOUS | Qty: 122 | Status: AC

## 2022-04-17 MED FILL — Dexamethasone Sodium Phosphate Inj 100 MG/10ML: INTRAMUSCULAR | Qty: 1 | Status: AC

## 2022-04-17 NOTE — Telephone Encounter (Signed)
-----   Message from Derwood Kaplan, MD sent at 04/14/2022  4:40 PM EST ----- Regarding: med I told him he could stop the iron

## 2022-04-17 NOTE — Telephone Encounter (Signed)
Iron taken off medication list

## 2022-04-18 ENCOUNTER — Inpatient Hospital Stay: Payer: BC Managed Care – PPO

## 2022-04-18 VITALS — BP 154/101 | HR 79 | Temp 98.1°F | Resp 20 | Ht 78.0 in | Wt 275.8 lb

## 2022-04-18 DIAGNOSIS — Z5112 Encounter for antineoplastic immunotherapy: Secondary | ICD-10-CM | POA: Diagnosis not present

## 2022-04-18 DIAGNOSIS — C787 Secondary malignant neoplasm of liver and intrahepatic bile duct: Secondary | ICD-10-CM

## 2022-04-18 DIAGNOSIS — C187 Malignant neoplasm of sigmoid colon: Secondary | ICD-10-CM

## 2022-04-18 DIAGNOSIS — C189 Malignant neoplasm of colon, unspecified: Secondary | ICD-10-CM | POA: Diagnosis not present

## 2022-04-18 MED ORDER — SODIUM CHLORIDE 0.9 % IV SOLN
10.0000 mg | Freq: Once | INTRAVENOUS | Status: AC
  Administered 2022-04-18: 10 mg via INTRAVENOUS
  Filled 2022-04-18: qty 10

## 2022-04-18 MED ORDER — FLUOROURACIL CHEMO INJECTION 2.5 GM/50ML
398.0000 mg/m2 | Freq: Once | INTRAVENOUS | Status: AC
  Administered 2022-04-18: 1000 mg via INTRAVENOUS
  Filled 2022-04-18: qty 20

## 2022-04-18 MED ORDER — SODIUM CHLORIDE 0.9 % IV SOLN
5.0000 mg/kg | Freq: Once | INTRAVENOUS | Status: AC
  Administered 2022-04-18: 600 mg via INTRAVENOUS
  Filled 2022-04-18: qty 16

## 2022-04-18 MED ORDER — SODIUM CHLORIDE 0.9 % IV SOLN: Freq: Once | INTRAVENOUS | Status: AC

## 2022-04-18 MED ORDER — ATROPINE SULFATE 1 MG/ML IV SOLN
0.5000 mg | Freq: Once | INTRAVENOUS | Status: AC | PRN
  Administered 2022-04-18: 0.5 mg via INTRAVENOUS
  Filled 2022-04-18: qty 1

## 2022-04-18 MED ORDER — LEUCOVORIN CALCIUM INJECTION 350 MG
397.0000 mg/m2 | Freq: Once | INTRAVENOUS | Status: AC
  Administered 2022-04-18: 1020 mg via INTRAVENOUS
  Filled 2022-04-18: qty 51

## 2022-04-18 MED ORDER — PALONOSETRON HCL INJECTION 0.25 MG/5ML
0.2500 mg | Freq: Once | INTRAVENOUS | Status: AC
  Administered 2022-04-18: 0.25 mg via INTRAVENOUS
  Filled 2022-04-18: qty 5

## 2022-04-18 MED ORDER — SODIUM CHLORIDE 0.9 % IV SOLN
2375.0000 mg/m2 | INTRAVENOUS | Status: DC
  Administered 2022-04-18: 6100 mg via INTRAVENOUS
  Filled 2022-04-18: qty 122

## 2022-04-18 MED ORDER — IRINOTECAN HCL CHEMO INJECTION 100 MG/5ML
180.0000 mg/m2 | Freq: Once | INTRAVENOUS | Status: AC
  Administered 2022-04-18: 460 mg via INTRAVENOUS
  Filled 2022-04-18: qty 15

## 2022-04-18 NOTE — Patient Instructions (Signed)
Bevacizumab Injection What is this medication? BEVACIZUMAB (be va SIZ yoo mab) treats some types of cancer. It works by blocking a protein that causes cancer cells to grow and multiply. This helps to slow or stop the spread of cancer cells. It is a monoclonal antibody. This medicine may be used for other purposes; ask your health care provider or pharmacist if you have questions. COMMON BRAND NAME(S): Alymsys, Avastin, MVASI, Zirabev What should I tell my care team before I take this medication? They need to know if you have any of these conditions: Blood clots Coughing up blood Having or recent surgery Heart failure High blood pressure History of a connection between 2 or more body parts that do not usually connect (fistula) History of a tear in your stomach or intestines Protein in your urine An unusual or allergic reaction to bevacizumab, other medications, foods, dyes, or preservatives Pregnant or trying to get pregnant Breast-feeding How should I use this medication? This medication is injected into a vein. It is given by your care team in a hospital or clinic setting. Talk to your care team the use of this medication in children. Special care may be needed. Overdosage: If you think you have taken too much of this medicine contact a poison control center or emergency room at once. NOTE: This medicine is only for you. Do not share this medicine with others. What if I miss a dose? Keep appointments for follow-up doses. It is important not to miss your dose. Call your care team if you are unable to keep an appointment. What may interact with this medication? Interactions are not expected. This list may not describe all possible interactions. Give your health care provider a list of all the medicines, herbs, non-prescription drugs, or dietary supplements you use. Also tell them if you smoke, drink alcohol, or use illegal drugs. Some items may interact with your medicine. What should I  watch for while using this medication? Your condition will be monitored carefully while you are receiving this medication. You may need blood work while taking this medication. This medication may make you feel generally unwell. This is not uncommon as chemotherapy can affect healthy cells as well as cancer cells. Report any side effects. Continue your course of treatment even though you feel ill unless your care team tells you to stop. This medication may increase your risk to bruise or bleed. Call your care team if you notice any unusual bleeding. Before having surgery, talk to your care team to make sure it is ok. This medication can increase the risk of poor healing of your surgical site or wound. You will need to stop this medication for 28 days before surgery. After surgery, wait at least 28 days before restarting this medication. Make sure the surgical site or wound is healed enough before restarting this medication. Talk to your care team if questions. Talk to your care team if you may be pregnant. Serious birth defects can occur if you take this medication during pregnancy and for 6 months after the last dose. Contraception is recommended while taking this medication and for 6 months after the last dose. Your care team can help you find the option that works for you. Do not breastfeed while taking this medication and for 6 months after the last dose. This medication can cause infertility. Talk to your care team if you are concerned about your fertility. What side effects may I notice from receiving this medication? Side effects that you should report   to your care team as soon as possible: Allergic reactions--skin rash, itching, hives, swelling of the face, lips, tongue, or throat Bleeding--bloody or black, tar-like stools, vomiting blood or brown material that looks like coffee grounds, red or dark brown urine, small red or purple spots on skin, unusual bruising or bleeding Blood clot--pain,  swelling, or warmth in the leg, shortness of breath, chest pain Heart attack--pain or tightness in the chest, shoulders, arms, or jaw, nausea, shortness of breath, cold or clammy skin, feeling faint or lightheaded Heart failure--shortness of breath, swelling of the ankles, feet, or hands, sudden weight gain, unusual weakness or fatigue Increase in blood pressure Infection--fever, chills, cough, sore throat, wounds that don't heal, pain or trouble when passing urine, general feeling of discomfort or being unwell Infusion reactions--chest pain, shortness of breath or trouble breathing, feeling faint or lightheaded Kidney injury--decrease in the amount of urine, swelling of the ankles, hands, or feet Stomach pain that is severe, does not go away, or gets worse Stroke--sudden numbness or weakness of the face, arm, or leg, trouble speaking, confusion, trouble walking, loss of balance or coordination, dizziness, severe headache, change in vision Sudden and severe headache, confusion, change in vision, seizures, which may be signs of posterior reversible encephalopathy syndrome (PRES) Side effects that usually do not require medical attention (report to your care team if they continue or are bothersome): Back pain Change in taste Diarrhea Dry skin Increased tears Nosebleed This list may not describe all possible side effects. Call your doctor for medical advice about side effects. You may report side effects to FDA at 1-800-FDA-1088. Where should I keep my medication? This medication is given in a hospital or clinic. It will not be stored at home. NOTE: This sheet is a summary. It may not cover all possible information. If you have questions about this medicine, talk to your doctor, pharmacist, or health care provider.  2023 Elsevier/Gold Standard (2021-08-26 00:00:00) Fluorouracil Injection What is this medication? FLUOROURACIL (flure oh YOOR a sil) treats some types of cancer. It works by slowing  down the growth of cancer cells. This medicine may be used for other purposes; ask your health care provider or pharmacist if you have questions. COMMON BRAND NAME(S): Adrucil What should I tell my care team before I take this medication? They need to know if you have any of these conditions: Blood disorders Dihydropyrimidine dehydrogenase (DPD) deficiency Infection, such as chickenpox, cold sores, herpes Kidney disease Liver disease Poor nutrition Recent or ongoing radiation therapy An unusual or allergic reaction to fluorouracil, other medications, foods, dyes, or preservatives If you or your partner are pregnant or trying to get pregnant Breast-feeding How should I use this medication? This medication is injected into a vein. It is administered by your care team in a hospital or clinic setting. Talk to your care team about the use of this medication in children. Special care may be needed. Overdosage: If you think you have taken too much of this medicine contact a poison control center or emergency room at once. NOTE: This medicine is only for you. Do not share this medicine with others. What if I miss a dose? Keep appointments for follow-up doses. It is important not to miss your dose. Call your care team if you are unable to keep an appointment. What may interact with this medication? Do not take this medication with any of the following: Live virus vaccines This medication may also interact with the following: Medications that treat or prevent blood  clots, such as warfarin, enoxaparin, dalteparin This list may not describe all possible interactions. Give your health care provider a list of all the medicines, herbs, non-prescription drugs, or dietary supplements you use. Also tell them if you smoke, drink alcohol, or use illegal drugs. Some items may interact with your medicine. What should I watch for while using this medication? Your condition will be monitored carefully while you  are receiving this medication. This medication may make you feel generally unwell. This is not uncommon as chemotherapy can affect healthy cells as well as cancer cells. Report any side effects. Continue your course of treatment even though you feel ill unless your care team tells you to stop. In some cases, you may be given additional medications to help with side effects. Follow all directions for their use. This medication may increase your risk of getting an infection. Call your care team for advice if you get a fever, chills, sore throat, or other symptoms of a cold or flu. Do not treat yourself. Try to avoid being around people who are sick. This medication may increase your risk to bruise or bleed. Call your care team if you notice any unusual bleeding. Be careful brushing or flossing your teeth or using a toothpick because you may get an infection or bleed more easily. If you have any dental work done, tell your dentist you are receiving this medication. Avoid taking medications that contain aspirin, acetaminophen, ibuprofen, naproxen, or ketoprofen unless instructed by your care team. These medications may hide a fever. Do not treat diarrhea with over the counter products. Contact your care team if you have diarrhea that lasts more than 2 days or if it is severe and watery. This medication can make you more sensitive to the sun. Keep out of the sun. If you cannot avoid being in the sun, wear protective clothing and sunscreen. Do not use sun lamps, tanning beds, or tanning booths. Talk to your care team if you or your partner wish to become pregnant or think you might be pregnant. This medication can cause serious birth defects if taken during pregnancy and for 3 months after the last dose. A reliable form of contraception is recommended while taking this medication and for 3 months after the last dose. Talk to your care team about effective forms of contraception. Do not father a child while taking  this medication and for 3 months after the last dose. Use a condom while having sex during this time period. Do not breastfeed while taking this medication. This medication may cause infertility. Talk to your care team if you are concerned about your fertility. What side effects may I notice from receiving this medication? Side effects that you should report to your care team as soon as possible: Allergic reactions--skin rash, itching, hives, swelling of the face, lips, tongue, or throat Heart attack--pain or tightness in the chest, shoulders, arms, or jaw, nausea, shortness of breath, cold or clammy skin, feeling faint or lightheaded Heart failure--shortness of breath, swelling of the ankles, feet, or hands, sudden weight gain, unusual weakness or fatigue Heart rhythm changes--fast or irregular heartbeat, dizziness, feeling faint or lightheaded, chest pain, trouble breathing High ammonia level--unusual weakness or fatigue, confusion, loss of appetite, nausea, vomiting, seizures Infection--fever, chills, cough, sore throat, wounds that don't heal, pain or trouble when passing urine, general feeling of discomfort or being unwell Low red blood cell level--unusual weakness or fatigue, dizziness, headache, trouble breathing Pain, tingling, or numbness in the hands or feet,  muscle weakness, change in vision, confusion or trouble speaking, loss of balance or coordination, trouble walking, seizures Redness, swelling, and blistering of the skin over hands and feet Severe or prolonged diarrhea Unusual bruising or bleeding Side effects that usually do not require medical attention (report to your care team if they continue or are bothersome): Dry skin Headache Increased tears Nausea Pain, redness, or swelling with sores inside the mouth or throat Sensitivity to light Vomiting This list may not describe all possible side effects. Call your doctor for medical advice about side effects. You may report  side effects to FDA at 1-800-FDA-1088. Where should I keep my medication? This medication is given in a hospital or clinic. It will not be stored at home. NOTE: This sheet is a summary. It may not cover all possible information. If you have questions about this medicine, talk to your doctor, pharmacist, or health care provider.  2023 Elsevier/Gold Standard (2021-08-23 00:00:00) Leucovorin Injection What is this medication? LEUCOVORIN (loo koe VOR in) prevents side effects from certain medications, such as methotrexate. It works by increasing folate levels. This helps protect healthy cells in your body. It may also be used to treat anemia caused by low levels of folate. It can also be used with fluorouracil, a type of chemotherapy, to treat colorectal cancer. It works by increasing the effects of fluorouracil in the body. This medicine may be used for other purposes; ask your health care provider or pharmacist if you have questions. What should I tell my care team before I take this medication? They need to know if you have any of these conditions: Anemia from low levels of vitamin B12 in the blood An unusual or allergic reaction to leucovorin, folic acid, other medications, foods, dyes, or preservatives Pregnant or trying to get pregnant Breastfeeding How should I use this medication? This medication is injected into a vein or a muscle. It is given by your care team in a hospital or clinic setting. Talk to your care team about the use of this medication in children. Special care may be needed. Overdosage: If you think you have taken too much of this medicine contact a poison control center or emergency room at once. NOTE: This medicine is only for you. Do not share this medicine with others. What if I miss a dose? Keep appointments for follow-up doses. It is important not to miss your dose. Call your care team if you are unable to keep an appointment. What may interact with this  medication? Capecitabine Fluorouracil Phenobarbital Phenytoin Primidone Trimethoprim;sulfamethoxazole This list may not describe all possible interactions. Give your health care provider a list of all the medicines, herbs, non-prescription drugs, or dietary supplements you use. Also tell them if you smoke, drink alcohol, or use illegal drugs. Some items may interact with your medicine. What should I watch for while using this medication? Your condition will be monitored carefully while you are receiving this medication. This medication may increase the side effects of 5-fluorouracil. Tell your care team if you have diarrhea or mouth sores that do not get better or that get worse. What side effects may I notice from receiving this medication? Side effects that you should report to your care team as soon as possible: Allergic reactions--skin rash, itching, hives, swelling of the face, lips, tongue, or throat This list may not describe all possible side effects. Call your doctor for medical advice about side effects. You may report side effects to FDA at 1-800-FDA-1088. Where should I  keep my medication? This medication is given in a hospital or clinic. It will not be stored at home. NOTE: This sheet is a summary. It may not cover all possible information. If you have questions about this medicine, talk to your doctor, pharmacist, or health care provider.  2023 Elsevier/Gold Standard (2021-09-27 00:00:00) Irinotecan Injection What is this medication? IRINOTECAN (ir in oh TEE kan) treats some types of cancer. It works by slowing down the growth of cancer cells. This medicine may be used for other purposes; ask your health care provider or pharmacist if you have questions. COMMON BRAND NAME(S): Camptosar What should I tell my care team before I take this medication? They need to know if you have any of these conditions: Dehydration Diarrhea Infection, especially a viral infection, such as  chickenpox, cold sores, herpes Liver disease Low blood cell levels (white cells, red cells, and platelets) Low levels of electrolytes, such as calcium, magnesium, or potassium in your blood Recent or ongoing radiation An unusual or allergic reaction to irinotecan, other medications, foods, dyes, or preservatives If you or your partner are pregnant or trying to get pregnant Breast-feeding How should I use this medication? This medication is injected into a vein. It is given by your care team in a hospital or clinic setting. Talk to your care team about the use of this medication in children. Special care may be needed. Overdosage: If you think you have taken too much of this medicine contact a poison control center or emergency room at once. NOTE: This medicine is only for you. Do not share this medicine with others. What if I miss a dose? Keep appointments for follow-up doses. It is important not to miss your dose. Call your care team if you are unable to keep an appointment. What may interact with this medication? Do not take this medication with any of the following: Cobicistat Itraconazole This medication may also interact with the following: Certain antibiotics, such as clarithromycin, rifampin, rifabutin Certain antivirals for HIV or AIDS Certain medications for fungal infections, such as ketoconazole, posaconazole, voriconazole Certain medications for seizures, such as carbamazepine, phenobarbital, phenytoin Gemfibrozil Nefazodone St. John's wort This list may not describe all possible interactions. Give your health care provider a list of all the medicines, herbs, non-prescription drugs, or dietary supplements you use. Also tell them if you smoke, drink alcohol, or use illegal drugs. Some items may interact with your medicine. What should I watch for while using this medication? Your condition will be monitored carefully while you are receiving this medication. You may need blood  work while taking this medication. This medication may make you feel generally unwell. This is not uncommon as chemotherapy can affect healthy cells as well as cancer cells. Report any side effects. Continue your course of treatment even though you feel ill unless your care team tells you to stop. This medication can cause serious side effects. To reduce the risk, your care team may give you other medications to take before receiving this one. Be sure to follow the directions from your care team. This medication may affect your coordination, reaction time, or judgement. Do not drive or operate machinery until you know how this medication affects you. Sit up or stand slowly to reduce the risk of dizzy or fainting spells. Drinking alcohol with this medication can increase the risk of these side effects. This medication may increase your risk of getting an infection. Call your care team for advice if you get a fever, chills,  sore throat, or other symptoms of a cold or flu. Do not treat yourself. Try to avoid being around people who are sick. Avoid taking medications that contain aspirin, acetaminophen, ibuprofen, naproxen, or ketoprofen unless instructed by your care team. These medications may hide a fever. This medication may increase your risk to bruise or bleed. Call your care team if you notice any unusual bleeding. Be careful brushing or flossing your teeth or using a toothpick because you may get an infection or bleed more easily. If you have any dental work done, tell your dentist you are receiving this medication. Talk to your care team if you or your partner are pregnant or think either of you might be pregnant. This medication can cause serious birth defects if taken during pregnancy and for 6 months after the last dose. You will need a negative pregnancy test before starting this medication. Contraception is recommended while taking this medication and for 6 months after the last dose. Your care  team can help you find the option that works for you. Do not father a child while taking this medication and for 3 months after the last dose. Use a condom for contraception during this time period. Do not breastfeed while taking this medication and for 7 days after the last dose. This medication may cause infertility. Talk to your care team if you are concerned about your fertility. What side effects may I notice from receiving this medication? Side effects that you should report to your care team as soon as possible: Allergic reactions--skin rash, itching, hives, swelling of the face, lips, tongue, or throat Dry cough, shortness of breath or trouble breathing Increased saliva or tears, increased sweating, stomach cramping, diarrhea, small pupils, unusual weakness or fatigue, slow heartbeat Infection--fever, chills, cough, sore throat, wounds that don't heal, pain or trouble when passing urine, general feeling of discomfort or being unwell Kidney injury--decrease in the amount of urine, swelling of the ankles, hands, or feet Low red blood cell level--unusual weakness or fatigue, dizziness, headache, trouble breathing Severe or prolonged diarrhea Unusual bruising or bleeding Side effects that usually do not require medical attention (report to your care team if they continue or are bothersome): Constipation Diarrhea Hair loss Loss of appetite Nausea Stomach pain This list may not describe all possible side effects. Call your doctor for medical advice about side effects. You may report side effects to FDA at 1-800-FDA-1088. Where should I keep my medication? This medication is given in a hospital or clinic. It will not be stored at home. NOTE: This sheet is a summary. It may not cover all possible information. If you have questions about this medicine, talk to your doctor, pharmacist, or health care provider.  2023 Elsevier/Gold Standard (2021-09-01 00:00:00)

## 2022-04-20 ENCOUNTER — Inpatient Hospital Stay: Payer: BC Managed Care – PPO

## 2022-04-20 VITALS — BP 140/107 | HR 95 | Temp 98.1°F | Resp 18 | Ht 78.0 in | Wt 274.1 lb

## 2022-04-20 DIAGNOSIS — Z5112 Encounter for antineoplastic immunotherapy: Secondary | ICD-10-CM | POA: Diagnosis not present

## 2022-04-20 DIAGNOSIS — C787 Secondary malignant neoplasm of liver and intrahepatic bile duct: Secondary | ICD-10-CM

## 2022-04-20 DIAGNOSIS — C187 Malignant neoplasm of sigmoid colon: Secondary | ICD-10-CM

## 2022-04-20 MED ORDER — SODIUM CHLORIDE 0.9% FLUSH
10.0000 mL | INTRAVENOUS | Status: DC | PRN
  Administered 2022-04-20: 10 mL

## 2022-04-20 MED ORDER — HEPARIN SOD (PORK) LOCK FLUSH 100 UNIT/ML IV SOLN
500.0000 [IU] | Freq: Once | INTRAVENOUS | Status: AC | PRN
  Administered 2022-04-20: 500 [IU]

## 2022-04-20 NOTE — Patient Instructions (Signed)
Fluorouracil Injection What is this medication? FLUOROURACIL (flure oh YOOR a sil) treats some types of cancer. It works by slowing down the growth of cancer cells. This medicine may be used for other purposes; ask your health care provider or pharmacist if you have questions. COMMON BRAND NAME(S): Adrucil What should I tell my care team before I take this medication? They need to know if you have any of these conditions: Blood disorders Dihydropyrimidine dehydrogenase (DPD) deficiency Infection, such as chickenpox, cold sores, herpes Kidney disease Liver disease Poor nutrition Recent or ongoing radiation therapy An unusual or allergic reaction to fluorouracil, other medications, foods, dyes, or preservatives If you or your partner are pregnant or trying to get pregnant Breast-feeding How should I use this medication? This medication is injected into a vein. It is administered by your care team in a hospital or clinic setting. Talk to your care team about the use of this medication in children. Special care may be needed. Overdosage: If you think you have taken too much of this medicine contact a poison control center or emergency room at once. NOTE: This medicine is only for you. Do not share this medicine with others. What if I miss a dose? Keep appointments for follow-up doses. It is important not to miss your dose. Call your care team if you are unable to keep an appointment. What may interact with this medication? Do not take this medication with any of the following: Live virus vaccines This medication may also interact with the following: Medications that treat or prevent blood clots, such as warfarin, enoxaparin, dalteparin This list may not describe all possible interactions. Give your health care provider a list of all the medicines, herbs, non-prescription drugs, or dietary supplements you use. Also tell them if you smoke, drink alcohol, or use illegal drugs. Some items may  interact with your medicine. What should I watch for while using this medication? Your condition will be monitored carefully while you are receiving this medication. This medication may make you feel generally unwell. This is not uncommon as chemotherapy can affect healthy cells as well as cancer cells. Report any side effects. Continue your course of treatment even though you feel ill unless your care team tells you to stop. In some cases, you may be given additional medications to help with side effects. Follow all directions for their use. This medication may increase your risk of getting an infection. Call your care team for advice if you get a fever, chills, sore throat, or other symptoms of a cold or flu. Do not treat yourself. Try to avoid being around people who are sick. This medication may increase your risk to bruise or bleed. Call your care team if you notice any unusual bleeding. Be careful brushing or flossing your teeth or using a toothpick because you may get an infection or bleed more easily. If you have any dental work done, tell your dentist you are receiving this medication. Avoid taking medications that contain aspirin, acetaminophen, ibuprofen, naproxen, or ketoprofen unless instructed by your care team. These medications may hide a fever. Do not treat diarrhea with over the counter products. Contact your care team if you have diarrhea that lasts more than 2 days or if it is severe and watery. This medication can make you more sensitive to the sun. Keep out of the sun. If you cannot avoid being in the sun, wear protective clothing and sunscreen. Do not use sun lamps, tanning beds, or tanning booths. Talk to   your care team if you or your partner wish to become pregnant or think you might be pregnant. This medication can cause serious birth defects if taken during pregnancy and for 3 months after the last dose. A reliable form of contraception is recommended while taking this  medication and for 3 months after the last dose. Talk to your care team about effective forms of contraception. Do not father a child while taking this medication and for 3 months after the last dose. Use a condom while having sex during this time period. Do not breastfeed while taking this medication. This medication may cause infertility. Talk to your care team if you are concerned about your fertility. What side effects may I notice from receiving this medication? Side effects that you should report to your care team as soon as possible: Allergic reactions--skin rash, itching, hives, swelling of the face, lips, tongue, or throat Heart attack--pain or tightness in the chest, shoulders, arms, or jaw, nausea, shortness of breath, cold or clammy skin, feeling faint or lightheaded Heart failure--shortness of breath, swelling of the ankles, feet, or hands, sudden weight gain, unusual weakness or fatigue Heart rhythm changes--fast or irregular heartbeat, dizziness, feeling faint or lightheaded, chest pain, trouble breathing High ammonia level--unusual weakness or fatigue, confusion, loss of appetite, nausea, vomiting, seizures Infection--fever, chills, cough, sore throat, wounds that don't heal, pain or trouble when passing urine, general feeling of discomfort or being unwell Low red blood cell level--unusual weakness or fatigue, dizziness, headache, trouble breathing Pain, tingling, or numbness in the hands or feet, muscle weakness, change in vision, confusion or trouble speaking, loss of balance or coordination, trouble walking, seizures Redness, swelling, and blistering of the skin over hands and feet Severe or prolonged diarrhea Unusual bruising or bleeding Side effects that usually do not require medical attention (report to your care team if they continue or are bothersome): Dry skin Headache Increased tears Nausea Pain, redness, or swelling with sores inside the mouth or throat Sensitivity  to light Vomiting This list may not describe all possible side effects. Call your doctor for medical advice about side effects. You may report side effects to FDA at 1-800-FDA-1088. Where should I keep my medication? This medication is given in a hospital or clinic. It will not be stored at home. NOTE: This sheet is a summary. It may not cover all possible information. If you have questions about this medicine, talk to your doctor, pharmacist, or health care provider.  2023 [employer] (2021-08-23 00:00:00)  

## 2022-04-28 ENCOUNTER — Telehealth: Payer: Self-pay | Admitting: Hematology and Oncology

## 2022-04-28 ENCOUNTER — Inpatient Hospital Stay: Payer: BC Managed Care – PPO

## 2022-04-28 ENCOUNTER — Inpatient Hospital Stay (HOSPITAL_BASED_OUTPATIENT_CLINIC_OR_DEPARTMENT_OTHER): Payer: BC Managed Care – PPO | Admitting: Hematology and Oncology

## 2022-04-28 ENCOUNTER — Encounter: Payer: Self-pay | Admitting: Hematology and Oncology

## 2022-04-28 VITALS — BP 146/110 | HR 96 | Temp 97.8°F | Resp 17 | Ht 78.0 in | Wt 269.8 lb

## 2022-04-28 DIAGNOSIS — K649 Unspecified hemorrhoids: Secondary | ICD-10-CM | POA: Diagnosis not present

## 2022-04-28 DIAGNOSIS — C187 Malignant neoplasm of sigmoid colon: Secondary | ICD-10-CM

## 2022-04-28 DIAGNOSIS — D500 Iron deficiency anemia secondary to blood loss (chronic): Secondary | ICD-10-CM

## 2022-04-28 DIAGNOSIS — C787 Secondary malignant neoplasm of liver and intrahepatic bile duct: Secondary | ICD-10-CM

## 2022-04-28 DIAGNOSIS — Z5112 Encounter for antineoplastic immunotherapy: Secondary | ICD-10-CM | POA: Diagnosis not present

## 2022-04-28 LAB — CBC: RBC: 4.66 (ref 3.87–5.11)

## 2022-04-28 LAB — BASIC METABOLIC PANEL
BUN: 14 (ref 4–21)
CO2: 25 — AB (ref 13–22)
Chloride: 101 (ref 99–108)
Creatinine: 1 (ref 0.6–1.3)
Glucose: 117
Potassium: 4.5 mEq/L (ref 3.5–5.1)
Sodium: 138 (ref 137–147)

## 2022-04-28 LAB — HEPATIC FUNCTION PANEL
ALT: 18 U/L (ref 10–40)
AST: 33 (ref 14–40)
Alkaline Phosphatase: 70 (ref 25–125)
Bilirubin, Total: 0.6

## 2022-04-28 LAB — CBC AND DIFFERENTIAL
HCT: 40 — AB (ref 41–53)
Hemoglobin: 14.2 (ref 13.5–17.5)
Neutrophils Absolute: 2.01
Platelets: 234 10*3/uL (ref 150–400)
WBC: 3.8

## 2022-04-28 LAB — COMPREHENSIVE METABOLIC PANEL
Albumin: 3.7 (ref 3.5–5.0)
Calcium: 8.9 (ref 8.7–10.7)

## 2022-04-28 LAB — TOTAL PROTEIN, URINE DIPSTICK: Protein, ur: NEGATIVE mg/dL

## 2022-04-28 MED ORDER — HYDROCORTISONE ACETATE 25 MG RE SUPP: 25.0000 mg | Freq: Two times a day (BID) | RECTAL | 1 refills | Status: AC

## 2022-04-28 MED FILL — Leucovorin Calcium For Inj 350 MG: INTRAMUSCULAR | Qty: 51 | Status: AC

## 2022-04-28 MED FILL — Fluorouracil IV Soln 5 GM/100ML (50 MG/ML): INTRAVENOUS | Qty: 122 | Status: AC

## 2022-04-28 MED FILL — Fluorouracil IV Soln 2.5 GM/50ML (50 MG/ML): INTRAVENOUS | Qty: 20 | Status: AC

## 2022-04-28 MED FILL — Irinotecan HCl Inj 100 MG/5ML (20 MG/ML): INTRAVENOUS | Qty: 23 | Status: AC

## 2022-04-28 MED FILL — Bevacizumab-awwb IV Soln 400 MG/16ML (For Infusion): INTRAVENOUS | Qty: 24 | Status: AC

## 2022-04-28 MED FILL — Dexamethasone Sodium Phosphate Inj 100 MG/10ML: INTRAMUSCULAR | Qty: 1 | Status: AC

## 2022-04-28 NOTE — Assessment & Plan Note (Signed)
Microcytic anemia, which is stable.  He denies any overt form of blood loss.  Iron studies were equivocal in July.  He was placed on ferrous sulfate daily with improvement in his hemoglobin.  Oral iron was discontinued earlier this month.  His hemoglobin and MCV remain normal.

## 2022-04-28 NOTE — Assessment & Plan Note (Addendum)
History of liver metastasis in January 2021 treated with resection alone.  He had recurrent liver metastasis treated with a partial hepatectomy in June.  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  There was 1 negative node but margins were close.  This was attached to the diaphragm but the tissue from the diaphragm is benign.  He had a complicated postoperative course developing an cyst requiring drainage.  He then developed a right pleural effusion in July treated with a VATS procedure.  He is receiving chemotherapy with FOLFIRI/bevacizumab, which he started in August.  His third cycle of chemotherapy was delayed as he was admitted with sepsis.  He completed IV antibiotics as an outpatient.  CT abdomen and pelvis in the ER in September did not reveal any obvious evidence of recurrence.  There was a 12 mm porta hepatis lymph node, attention on follow-up was recommended.   MRI abdomen on December 6th revealed near complete right hepatectomy, without residual/recurrent or new metastatic disease.  There was no evidence of extrahepatic abdominal metastasis.  He will proceed with 10th cycle of FOLFIRI/bevacizumab next week.  We will plan to see him back in 2 weeks for repeat clinical assessment prior to an 11th cycle.

## 2022-04-28 NOTE — Assessment & Plan Note (Addendum)
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  He was treated with surgical resection followed by 6 months of adjuvant chemotherapy with FOLFOX. He had ascites and peritoneal nodules in 2018 with negative biopsy and negative fluid cytology.  He was taken for exploratory laparotomy with removal of the nodule which is found to be benign.  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  We discussed postoperative chemotherapy, but ultimately decided on surveillance.    He was found to have a another solitary metastasis to the liver on MRI scan in May 2023.  He was returned referred to Dr. Rolla Etienne and underwent surgical resection on June 13th.  He did have postoperative complications including biliary leak and had ERCP with placement of an endoscopic stent into the biliary duct.  He was then was readmitted on June 27 with abscess and perihepatic fluid collection that required placement of a drain.  He had fevers and has felt very poorly since that time.  He was then found to have pleural effusion on July 1 treated with a right thoracoscopic total pulmonary decortication.    He is receiving postoperative chemotherapy with FOLFIRI/bevacizumab.  MRI abdomen on December 6th revealed near complete right hepatectomy, without residual/recurrent or new metastatic disease.  There was no evidence of extrahepatic abdominal metastasis.  He will proceed with 10th cycle of FOLFIRI/bevacizumab next week.  We will plan to see him back in 2 weeks with a CBC, comprehensive metabolic panel, and MRI abdomen to reassess his disease baseline prior to 11th cycle.

## 2022-04-28 NOTE — Progress Notes (Signed)
[facility]  [address] 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  He was treated with surgical resection followed by 6 months of adjuvant chemotherapy with FOLFOX. He had ascites and peritoneal nodules in 2018 with negative biopsy and negative fluid cytology.  He was taken for exploratory laparotomy with removal of the nodule which is found to be benign.  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  We discussed postoperative chemotherapy, but ultimately decided on surveillance.    He was found to have a another solitary metastasis to the liver on MRI scan in May 2023.  He was returned referred to Dr. Rolla Etienne and underwent surgical resection on June 13th.  He did have postoperative complications including biliary leak and had ERCP with placement of an endoscopic stent into the biliary duct.  He was then was readmitted on June 27 with abscess and perihepatic fluid collection that required placement of a drain.  He had fevers and has felt very poorly since that time.  He was then found to have pleural effusion on July 1 treated with a right thoracoscopic total pulmonary decortication.    He is receiving postoperative chemotherapy with FOLFIRI/bevacizumab.  MRI abdomen on December 6th revealed near complete right hepatectomy, without residual/recurrent or new metastatic disease.  There was no evidence of extrahepatic abdominal metastasis.  He will proceed with 10th cycle of FOLFIRI/bevacizumab next week.  We will plan to see him back in 2 weeks with a CBC, comprehensive metabolic panel, and MRI abdomen to reassess his disease baseline prior to 11th cycle.  Malignant neoplasm  metastatic to liver [facility]) History of liver metastasis in January 2021 treated with resection alone.  He had recurrent liver metastasis treated with a partial hepatectomy in June.  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  There was 1 negative node but margins were close.  This was attached to the diaphragm but the tissue from the diaphragm is benign.  He had a complicated postoperative course developing an cyst requiring drainage.  He then developed a right pleural effusion in July treated with a VATS procedure.  He is receiving chemotherapy with FOLFIRI/bevacizumab, which he started in August.  His third cycle of chemotherapy was delayed as he was admitted with sepsis.  He completed IV antibiotics as an outpatient.  CT abdomen and pelvis in the ER in September did not reveal any obvious evidence of recurrence.  There was a 12 mm porta hepatis lymph node, attention on follow-up was recommended.   MRI abdomen on December 6th revealed near complete right hepatectomy, without residual/recurrent or new metastatic disease.  There was no evidence of extrahepatic abdominal metastasis.  He will proceed with 10th cycle of FOLFIRI/bevacizumab next week.  We will plan to see him back in 2 weeks for repeat clinical assessment prior to an 11th cycle.  Iron deficiency anemia due to chronic blood loss Microcytic anemia, which is stable.  He denies any overt form of blood loss.  Iron studies were equivocal in July.  He was placed on ferrous sulfate daily with improvement in his hemoglobin.  Oral iron was discontinued earlier this month.  His hemoglobin and MCV  remain normal.  Hemorrhoids He has been struggling with hemorrhoids since September.  He continues to report intermittent rectal pain and bleeding.  He has been using ProctoCream, but due to irritation of his skin in the perianal area, he has stopped this.  He states he is using antifungal cream on the area with improvement.   I will give him Anusol HC the suppositories to try.   The patient understands the plans discussed today and is in agreement with them.  He knows to contact our office if he develops concerns prior to his next appointment.   I provided 20 minutes of face-to-face time during this encounter and > 50% was spent counseling as documented under my assessment and plan.    Marvia Pickles, PA-C  [facility] AT [facility] [address] [place] [address] Dept: [phone] Dept Fax: [phone]   Orders Placed This Encounter  Procedures   Basic metabolic panel    This external order was created through the Results Console.   Comprehensive metabolic panel    This external order was created through the Results Console.   Hepatic function panel    This external order was created through the Results Console.      CHIEF COMPLAINT:  CC: Recurrent cancer with liver metastasis  Current Treatment: FOLFIRI/bevacizumab every 2 weeks  HISTORY OF PRESENT ILLNESS:   Oncology History  Colon cancer ([place])  01/23/2016 Cancer Staging   Staging form: Colon and Rectum, AJCC 7th Edition - Clinical stage from 01/23/2016: Stage IIIB (T3, N2a, M0) - Signed by Derwood Kaplan, MD on 05/03/2020 Staging comments: 3.5 cm MMR, MSI normal, No mutation of KRAS or BRAF, neg.genetics Rec'd 6 months adjuvant FOLFOX chemotherapy   02/02/2016 Initial Diagnosis   Colon cancer ([place])   12/13/2021 - 12/29/2021 Chemotherapy   Patient is on Treatment Plan : COLORECTAL FOLFIRI / BEVACIZUMAB Q14D     12/13/2021 -  Chemotherapy   Patient is on Treatment Plan : COLORECTAL FOLFIRI + Bevacizumab q14d     Malignant neoplasm metastatic to liver ([place])  05/29/2019 Initial Diagnosis   Malignant neoplasm metastatic to liver ([place])   12/13/2021 - 12/29/2021 Chemotherapy   Patient is on Treatment Plan : COLORECTAL FOLFIRI / BEVACIZUMAB Q14D     12/13/2021 -  Chemotherapy   Patient  is on Treatment Plan : COLORECTAL FOLFIRI + Bevacizumab q14d     01/07/2022 Imaging   CT abdomen and pelvis:  IMPRESSION:  1. Interval right hepatectomy with surgical changes in the dome of  the left liver.  2. 12 mm short axis lymph node identified in the region of the porta  hepatis. Attention on follow-up recommended.  3. Left pelvic kidney with large simple appearing cyst measuring  12.5 cm, increased from 7.8 cm on 03/14/2017.  4. No acute findings in the abdomen or pelvis. Specifically, no  findings to explain the patient's history of abdominal pain and  fever.        INTERVAL HISTORY:  Arman is here today for repeat clinical assessment prior to 10th cycle of FOLFIRI/bevacizumab.  He states he was constipated after his last treatment, which resolved with doubling his Colace.  He plans to take MiraLAX with this cycle of chemotherapy..  I told him we could try holding the atropine, but he may have diarrhea.  He states he continues to have mild hemorrhoidal bleeding.  He stopped using the ProctoCream, as he developed has irritation between the buttocks.  He has been applying antifungal cream to the area for some time, and this with improvement.  He denies fevers or chills. He denies pain. His appetite is good. His weight has decreased 5 pounds over last 2 weeks .  REVIEW OF SYSTEMS:  Review of Systems  Constitutional:  Positive for unexpected weight change. Negative for appetite change, chills, fatigue and fever.  HENT:   Negative for lump/mass, mouth sores and sore throat.   Respiratory:  Negative for cough and shortness of breath.   Cardiovascular:  Negative for chest pain and leg swelling.  Gastrointestinal:  Positive for blood in stool (Hemorrhoids), constipation and rectal pain (Hemorrhoids). Negative for abdominal pain, diarrhea, nausea and vomiting.  Genitourinary:  Negative for difficulty urinating, dysuria, frequency and hematuria.   Musculoskeletal:  Negative for  arthralgias, back pain and myalgias.  Skin:  Positive for rash (between buttocks). Negative for itching and wound.  Neurological:  Negative for dizziness, extremity weakness, headaches, light-headedness and numbness.  Hematological:  Negative for adenopathy.  Psychiatric/Behavioral:  Negative for depression and sleep disturbance. The patient is not nervous/anxious.      VITALS:  Blood pressure (!) 146/110, pulse 96, temperature 97.8 F (36.6 C), temperature source Oral, resp. rate 17, height 6' 6"  (1.981 m), weight 269 lb 12.8 oz (122.4 kg), SpO2 98 %.  Wt Readings from Last 3 Encounters:  04/28/22 269 lb 12.8 oz (122.4 kg)  04/20/22 274 lb 1.9 oz (124.3 kg)  04/18/22 275 lb 12 oz (125.1 kg)    Body mass index is 31.18 kg/m.  Performance status (ECOG): 1 - Symptomatic but completely ambulatory  PHYSICAL EXAM:  Physical Exam Vitals and nursing note reviewed.  Constitutional:      General: He is not in acute distress.    Appearance: Normal appearance. He is normal weight.  HENT:     Head: Normocephalic and atraumatic.     Mouth/Throat:     Mouth: Mucous membranes are moist.     Pharynx: Oropharynx is clear. No oropharyngeal exudate or posterior oropharyngeal erythema.  Eyes:     General: No scleral icterus.    Extraocular Movements: Extraocular movements intact.     Conjunctiva/sclera: Conjunctivae normal.     Pupils: Pupils are equal, round, and reactive to light.  Cardiovascular:     Rate and Rhythm: Normal rate and regular rhythm.     Heart sounds: Normal heart sounds. No murmur heard.    No friction rub. No gallop.  Pulmonary:     Effort: Pulmonary effort is normal.     Breath sounds: Normal breath sounds. No wheezing, rhonchi or rales.  Abdominal:     General: Bowel sounds are normal. There is no distension.     Palpations: Abdomen is soft. There is no hepatomegaly, splenomegaly or mass.     Tenderness: There is no abdominal tenderness.  Musculoskeletal:         General: Normal range of motion.     Cervical back: Normal range of motion and neck supple. No tenderness.     Right lower leg: No edema.     Left lower leg: No edema.  Lymphadenopathy:     Cervical: No cervical adenopathy.     Upper Body:     Right upper body: No supraclavicular or axillary adenopathy.     Left upper body: No supraclavicular or axillary adenopathy.     Lower Body: No right inguinal adenopathy. No left inguinal adenopathy.  Skin:    General: Skin is warm and  dry.     Coloration: Skin is not jaundiced.  Neurological:     Mental Status: He is alert and oriented to person, place, and time.     Cranial Nerves: No cranial nerve deficit.  Psychiatric:        Mood and Affect: Mood normal.        Behavior: Behavior normal.        Thought Content: Thought content normal.     LABS:      Latest Ref Rng & Units 04/28/2022   12:00 AM 04/14/2022   12:00 AM 03/29/2022   12:00 AM  CBC  WBC  3.8     3.6     3.4      Hemoglobin 13.5 - 17.5 14.2     13.1     13.3      Hematocrit 41 - 53 40     39     39      Platelets 150 - 400 K/uL 234     205     215         This result is from an external source.      Latest Ref Rng & Units 04/28/2022   12:00 AM 04/14/2022    2:41 PM 03/29/2022    1:02 PM  CMP  Glucose 70 - 99 mg/dL  116  105   BUN 4 - 21 14     8  14    Creatinine 0.6 - 1.3 1.0     1.09  0.95   Sodium 137 - 147 138     141  133   Potassium 3.5 - 5.1 mEq/L 4.5     4.3  4.2   Chloride 99 - 108 101     105  99   CO2 13 - 22 25     26  27    Calcium 8.7 - 10.7 8.9     8.9  9.2   Total Protein 6.5 - 8.1 g/dL  6.0  7.1   Total Bilirubin 0.3 - 1.2 mg/dL  0.6  0.7   Alkaline Phos 25 - 125 70     63  64   AST 14 - 40 33     19  18   ALT 10 - 40 U/L 18     17  15       This result is from an external source.     Lab Results  Component Value Date   CEA1 0.8 01/06/2022   /  CEA  Date Value Ref Range Status  01/06/2022 0.8 0.0 - 4.7 ng/mL Final    Comment:     (NOTE)                             Nonsmokers          <3.9                             Smokers             <5.6 [employer] Electrochemiluminescence Immunoassay (ECLIA) Values obtained with different assay methods or kits cannot be used interchangeably.  Results cannot be interpreted as absolute evidence of the presence or absence of malignant disease. Performed At: [facility] [place], [place] [id number] Rush Farmer MD OV:[id number]    No results found for: "PSA1" No results found for: "OKH997" No  results found for: "CAN125"  No results found for: "TOTALPROTELP", "ALBUMINELP", "A1GS", "A2GS", "BETS", "BETA2SER", "GAMS", "MSPIKE", "SPEI" Lab Results  Component Value Date   TIBC 225 (L) 11/22/2021   FERRITIN 292 11/22/2021   IRONPCTSAT 6 (L) 11/22/2021   No results found for: "LDH"  STUDIES:  MR Abdomen W Wo Contrast  Result Date: 04/13/2022 CLINICAL DATA:  Status post hepatic resection of metastatic colon cancer. EXAM: MRI ABDOMEN WITHOUT AND WITH CONTRAST TECHNIQUE: Multiplanar multisequence MR imaging of the abdomen was performed both before and after the administration of intravenous contrast. CONTRAST:  67m GADAVIST GADOBUTROL 1 MMOL/ML IV SOLN COMPARISON:  10/07/2021 PET and 09/29/2021 abdominal MRI. [facility] FINDINGS: Lower chest: Normal heart size without pericardial or pleural effusion. Right hemidiaphragm elevation. Hepatobiliary: Near-complete right hepatectomy with extensive susceptibility artifact along the periphery of the remaining liver. No local recurrence or new hepatic metastasis. Cholecystectomy. Pancreas:  Normal, without mass or ductal dilatation. Spleen:  Normal in size, without focal abnormality. Adrenals/Urinary Tract: Normal adrenal glands. The left kidney is positioned in the pelvis, and incompletely imaged. The right kidney demonstrates an inter/lower pole 3 mm nonenhancing lesion which  is likely a cyst . In the absence of clinically indicated signs/symptoms require(s) no independent follow-up. No hydronephrosis. Stomach/Bowel: Normal stomach and abdominal bowel loops. Vascular/Lymphatic: Aortic atherosclerosis. Patent portal vein. No abdominal adenopathy. The porta hepatis node on 01/07/2022 CT is no longer identified. Other:  No ascites.  No evidence of omental or peritoneal disease. Musculoskeletal: No acute osseous abnormality. IMPRESSION: 1. Near complete right hepatectomy, without residual/recurrent or new metastatic disease. 2. No evidence of extrahepatic abdominal metastasis. 3.  Aortic Atherosclerosis (ICD10-I70.0). Electronically Signed   By: KAbigail MiyamotoM.D.   On: 04/13/2022 11:53      HISTORY:   Past Medical History:  Diagnosis Date   GERD (gastroesophageal reflux disease)    Renal lithiasis     Past Surgical History:  Procedure Laterality Date   LIVER RESECTION Right 07/02/2019   LIVER RESECTION Right 10/12/2021   SIGMOIDECTOMY  03/2016    Family History  Problem Relation Age of Onset   Lung cancer Father 786  Leukemia Father 870      acute myelogenous   Colon cancer Sister 567   Social History:  reports that he has quit smoking. He has never used smokeless tobacco. He reports that he does not currently use alcohol. No history on file for drug use.The patient is accompanied by his wife today.  Allergies: No Known Allergies  Current Medications: Current Outpatient Medications  Medication Sig Dispense Refill   hydrocortisone (ANUSOL-HC) 25 MG suppository Place 1 suppository (25 mg total) rectally 2 (two) times daily. 12 suppository 1   ascorbic acid (VITAMIN C) 1000 MG tablet Take by mouth.     benzonatate (TESSALON) 200 MG capsule Take 1 capsule (200 mg total) by mouth 3 (three) times daily as needed for cough. (Patient not taking: Reported on 03/02/2022) 20 capsule 1   Calcium Polycarbophil (FIBER) 625 MG TABS Take by mouth.     cetirizine (ZYRTEC)  10 MG tablet Take by mouth.     Cholecalciferol (VITAMIN D) 50 MCG (2000 UT) tablet Take by mouth.     Docusate Sodium (DSS) 100 MG CAPS Take 1 capsule by mouth daily.     loperamide (IMODIUM) 2 MG capsule Take 1 capsule (2 mg total) by mouth as needed for diarrhea or loose stools. Take 2 at diarrhea onset ,  then 1 every 2hr until 12hrs with no BM. May take 2 every 4hrs at night. If diarrhea recurs repeat. 100 capsule 5   LORazepam (ATIVAN) 1 MG tablet Take 1 tablet (1 mg total) by mouth every 6 (six) hours as needed for anxiety or sleep. 100 tablet 0   magic mouthwash (nystatin, lidocaine, diphenhydrAMINE, alum & mag hydroxide) suspension SWISH AND/OR SWALLOW 5 ML BY MOUTH EVERY THREE HOURS IF NEEDED     Misc Natural Products (ELDERBERRY IMMUNE COMPLEX) CHEW Chew 1 Dose by mouth daily.     Multiple Vitamin (MULTI-VITAMIN) tablet Take 1 tablet by mouth daily.     omeprazole (PRILOSEC) 20 MG capsule Take by mouth.     ondansetron (ZOFRAN) 8 MG tablet Take 1 tablet (8 mg total) by mouth 2 (two) times daily as needed for refractory nausea / vomiting. Start on day 3 after chemotherapy. 30 tablet 1   ondansetron (ZOFRAN-ODT) 4 MG disintegrating tablet Take 4 mg by mouth every 8 (eight) hours as needed. (Patient not taking: Reported on 03/02/2022)     oxyCODONE (OXY IR/ROXICODONE) 5 MG immediate release tablet Take by mouth.     Probiotic Product (PROBIOTIC BLEND PO) Take 1 tablet by mouth daily.     prochlorperazine (COMPAZINE) 10 MG tablet Take 1 tablet (10 mg total) by mouth every 6 (six) hours as needed (NAUSEA). (Patient not taking: Reported on 03/02/2022) 30 tablet 1   vitamin B-12 (CYANOCOBALAMIN) 250 MCG tablet Take by mouth.     zinc gluconate 50 MG tablet Take 50 mg by mouth daily.     No current facility-administered medications for this visit.   Facility-Administered Medications Ordered in Other Visits  Medication Dose Route Frequency Provider Last Rate Last Admin   atropine 1 MG/ML  injection            palonosetron (ALOXI) 0.25 MG/5ML injection

## 2022-04-28 NOTE — Telephone Encounter (Signed)
Patient has been scheduled for follow-up visit per 04/28/22 los. Pt given an appt calendar with date and time.

## 2022-04-28 NOTE — Assessment & Plan Note (Signed)
He has been struggling with hemorrhoids since September.  He continues to report intermittent rectal pain and bleeding.  He has been using ProctoCream, but due to irritation of his skin in the perianal area, he has stopped this.  He states he is using antifungal cream on the area with improvement.  I will give him Anusol HC the suppositories to try.

## 2022-04-30 ENCOUNTER — Other Ambulatory Visit: Payer: Self-pay

## 2022-05-02 ENCOUNTER — Encounter: Payer: Self-pay | Admitting: Oncology

## 2022-05-02 ENCOUNTER — Inpatient Hospital Stay: Payer: BC Managed Care – PPO

## 2022-05-02 VITALS — BP 153/100 | HR 87 | Temp 98.1°F | Resp 18 | Ht 78.0 in | Wt 272.0 lb

## 2022-05-02 DIAGNOSIS — Z5112 Encounter for antineoplastic immunotherapy: Secondary | ICD-10-CM | POA: Diagnosis not present

## 2022-05-02 DIAGNOSIS — C787 Secondary malignant neoplasm of liver and intrahepatic bile duct: Secondary | ICD-10-CM

## 2022-05-02 DIAGNOSIS — C187 Malignant neoplasm of sigmoid colon: Secondary | ICD-10-CM

## 2022-05-02 DIAGNOSIS — C189 Malignant neoplasm of colon, unspecified: Secondary | ICD-10-CM | POA: Diagnosis not present

## 2022-05-02 MED ORDER — SODIUM CHLORIDE 0.9 % IV SOLN
5.0000 mg/kg | Freq: Once | INTRAVENOUS | Status: AC
  Administered 2022-05-02: 600 mg via INTRAVENOUS
  Filled 2022-05-02: qty 16

## 2022-05-02 MED ORDER — ATROPINE SULFATE 1 MG/ML IV SOLN
0.5000 mg | Freq: Once | INTRAVENOUS | Status: AC | PRN
  Administered 2022-05-02: 0.5 mg via INTRAVENOUS
  Filled 2022-05-02: qty 1

## 2022-05-02 MED ORDER — FLUOROURACIL CHEMO INJECTION 2.5 GM/50ML
398.0000 mg/m2 | Freq: Once | INTRAVENOUS | Status: AC
  Administered 2022-05-02: 1000 mg via INTRAVENOUS
  Filled 2022-05-02: qty 20

## 2022-05-02 MED ORDER — SODIUM CHLORIDE 0.9 % IV SOLN: Freq: Once | INTRAVENOUS | Status: DC

## 2022-05-02 MED ORDER — SODIUM CHLORIDE 0.9 % IV SOLN
2375.0000 mg/m2 | INTRAVENOUS | Status: DC
  Administered 2022-05-02: 6100 mg via INTRAVENOUS
  Filled 2022-05-02: qty 122

## 2022-05-02 MED ORDER — PALONOSETRON HCL INJECTION 0.25 MG/5ML
0.2500 mg | Freq: Once | INTRAVENOUS | Status: AC
  Administered 2022-05-02: 0.25 mg via INTRAVENOUS
  Filled 2022-05-02: qty 5

## 2022-05-02 MED ORDER — SODIUM CHLORIDE 0.9 % IV SOLN: Freq: Once | INTRAVENOUS | Status: AC

## 2022-05-02 MED ORDER — IRINOTECAN HCL CHEMO INJECTION 100 MG/5ML
180.0000 mg/m2 | Freq: Once | INTRAVENOUS | Status: AC
  Administered 2022-05-02: 460 mg via INTRAVENOUS
  Filled 2022-05-02: qty 15

## 2022-05-02 MED ORDER — LEUCOVORIN CALCIUM INJECTION 350 MG
397.0000 mg/m2 | Freq: Once | INTRAVENOUS | Status: AC
  Administered 2022-05-02: 1020 mg via INTRAVENOUS
  Filled 2022-05-02: qty 51

## 2022-05-02 MED ORDER — SODIUM CHLORIDE 0.9 % IV SOLN
10.0000 mg | Freq: Once | INTRAVENOUS | Status: AC
  Administered 2022-05-02: 10 mg via INTRAVENOUS
  Filled 2022-05-02: qty 10

## 2022-05-02 NOTE — Patient Instructions (Signed)
Oxaliplatin Injection What is this medication? OXALIPLATIN (ox AL i PLA tin) treats some types of cancer. It works by slowing down the growth of cancer cells. This medicine may be used for other purposes; ask your health care provider or pharmacist if you have questions. COMMON BRAND NAME(S): Eloxatin What should I tell my care team before I take this medication? They need to know if you have any of these conditions: Heart disease History of irregular heartbeat or rhythm Liver disease Low blood cell levels (white cells, red cells, and platelets) Lung or breathing disease, such as asthma Take medications that treat or prevent blood clots Tingling of the fingers, toes, or other nerve disorder An unusual or allergic reaction to oxaliplatin, other medications, foods, dyes, or preservatives If you or your partner are pregnant or trying to get pregnant Breast-feeding How should I use this medication? This medication is injected into a vein. It is given by your care team in a hospital or clinic setting. Talk to your care team about the use of this medication in children. Special care may be needed. Overdosage: If you think you have taken too much of this medicine contact a poison control center or emergency room at once. NOTE: This medicine is only for you. Do not share this medicine with others. What if I miss a dose? Keep appointments for follow-up doses. It is important not to miss a dose. Call your care team if you are unable to keep an appointment. What may interact with this medication? Do not take this medication with any of the following: Cisapride Dronedarone Pimozide Thioridazine This medication may also interact with the following: Aspirin and aspirin-like medications Certain medications that treat or prevent blood clots, such as warfarin, apixaban, dabigatran, and rivaroxaban Cisplatin Cyclosporine Diuretics Medications for infection, such as acyclovir, adefovir, amphotericin  B, bacitracin, cidofovir, foscarnet, ganciclovir, gentamicin, pentamidine, vancomycin NSAIDs, medications for pain and inflammation, such as ibuprofen or naproxen Other medications that cause heart rhythm changes Pamidronate Zoledronic acid This list may not describe all possible interactions. Give your health care provider a list of all the medicines, herbs, non-prescription drugs, or dietary supplements you use. Also tell them if you smoke, drink alcohol, or use illegal drugs. Some items may interact with your medicine. What should I watch for while using this medication? Your condition will be monitored carefully while you are receiving this medication. You may need blood work while taking this medication. This medication may make you feel generally unwell. This is not uncommon as chemotherapy can affect healthy cells as well as cancer cells. Report any side effects. Continue your course of treatment even though you feel ill unless your care team tells you to stop. This medication may increase your risk of getting an infection. Call your care team for advice if you get a fever, chills, sore throat, or other symptoms of a cold or flu. Do not treat yourself. Try to avoid being around people who are sick. Avoid taking medications that contain aspirin, acetaminophen, ibuprofen, naproxen, or ketoprofen unless instructed by your care team. These medications may hide a fever. Be careful brushing or flossing your teeth or using a toothpick because you may get an infection or bleed more easily. If you have any dental work done, tell your dentist you are receiving this medication. This medication can make you more sensitive to cold. Do not drink cold drinks or use ice. Cover exposed skin before coming in contact with cold temperatures or cold objects. When out in  cold weather wear warm clothing and cover your mouth and nose to warm the air that goes into your lungs. Tell your care team if you get sensitive to  the cold. Talk to your care team if you or your partner are pregnant or think either of you might be pregnant. This medication can cause serious birth defects if taken during pregnancy and for 9 months after the last dose. A negative pregnancy test is required before starting this medication. A reliable form of contraception is recommended while taking this medication and for 9 months after the last dose. Talk to your care team about effective forms of contraception. Do not father a child while taking this medication and for 6 months after the last dose. Use a condom while having sex during this time period. Do not breastfeed while taking this medication and for 3 months after the last dose. This medication may cause infertility. Talk to your care team if you are concerned about your fertility. What side effects may I notice from receiving this medication? Side effects that you should report to your care team as soon as possible: Allergic reactions--skin rash, itching, hives, swelling of the face, lips, tongue, or throat Bleeding--bloody or black, tar-like stools, vomiting blood or brown material that looks like coffee grounds, red or dark brown urine, small red or purple spots on skin, unusual bruising or bleeding Dry cough, shortness of breath or trouble breathing Heart rhythm changes--fast or irregular heartbeat, dizziness, feeling faint or lightheaded, chest pain, trouble breathing Infection--fever, chills, cough, sore throat, wounds that don't heal, pain or trouble when passing urine, general feeling of discomfort or being unwell Liver injury--right upper belly pain, loss of appetite, nausea, light-colored stool, dark yellow or brown urine, yellowing skin or eyes, unusual weakness or fatigue Low red blood cell level--unusual weakness or fatigue, dizziness, headache, trouble breathing Muscle injury--unusual weakness or fatigue, muscle pain, dark yellow or brown urine, decrease in amount of  urine Pain, tingling, or numbness in the hands or feet Sudden and severe headache, confusion, change in vision, seizures, which may be signs of posterior reversible encephalopathy syndrome (PRES) Unusual bruising or bleeding Side effects that usually do not require medical attention (report to your care team if they continue or are bothersome): Diarrhea Nausea Pain, redness, or swelling with sores inside the mouth or throat Unusual weakness or fatigue Vomiting This list may not describe all possible side effects. Call your doctor for medical advice about side effects. You may report side effects to FDA at 1-800-FDA-1088. Where should I keep my medication? This medication is given in a hospital or clinic. It will not be stored at home. NOTE: This sheet is a summary. It may not cover all possible information. If you have questions about this medicine, talk to your doctor, pharmacist, or health care provider.  2023 [employer] Standard (2007-06-15 00:00:00) Leucovorin Injection What is this medication? LEUCOVORIN (loo koe VOR in) prevents side effects from certain medications, such as methotrexate. It works by increasing folate levels. This helps protect healthy cells in your body. It may also be used to treat anemia caused by low levels of folate. It can also be used with fluorouracil, a type of chemotherapy, to treat colorectal cancer. It works by increasing the effects of fluorouracil in the body. This medicine may be used for other purposes; ask your health care provider or pharmacist if you have questions. What should I tell my care team before I take this medication? They need to know if  you have any of these conditions: Anemia from low levels of vitamin B12 in the blood An unusual or allergic reaction to leucovorin, folic acid, other medications, foods, dyes, or preservatives Pregnant or trying to get pregnant Breastfeeding How should I use this medication? This medication is  injected into a vein or a muscle. It is given by your care team in a hospital or clinic setting. Talk to your care team about the use of this medication in children. Special care may be needed. Overdosage: If you think you have taken too much of this medicine contact a poison control center or emergency room at once. NOTE: This medicine is only for you. Do not share this medicine with others. What if I miss a dose? Keep appointments for follow-up doses. It is important not to miss your dose. Call your care team if you are unable to keep an appointment. What may interact with this medication? Capecitabine Fluorouracil Phenobarbital Phenytoin Primidone Trimethoprim;sulfamethoxazole This list may not describe all possible interactions. Give your health care provider a list of all the medicines, herbs, non-prescription drugs, or dietary supplements you use. Also tell them if you smoke, drink alcohol, or use illegal drugs. Some items may interact with your medicine. What should I watch for while using this medication? Your condition will be monitored carefully while you are receiving this medication. This medication may increase the side effects of 5-fluorouracil. Tell your care team if you have diarrhea or mouth sores that do not get better or that get worse. What side effects may I notice from receiving this medication? Side effects that you should report to your care team as soon as possible: Allergic reactions--skin rash, itching, hives, swelling of the face, lips, tongue, or throat This list may not describe all possible side effects. Call your doctor for medical advice about side effects. You may report side effects to FDA at 1-800-FDA-1088. Where should I keep my medication? This medication is given in a hospital or clinic. It will not be stored at home. NOTE: This sheet is a summary. It may not cover all possible information. If you have questions about this medicine, talk to your doctor,  pharmacist, or health care provider.  2023 [employer]/Gold Standard (2021-09-27 00:00:00) Bevacizumab Injection What is this medication? BEVACIZUMAB (be va SIZ yoo mab) treats some types of cancer. It works by blocking a protein that causes cancer cells to grow and multiply. This helps to slow or stop the spread of cancer cells. It is a monoclonal antibody. This medicine may be used for other purposes; ask your health care provider or pharmacist if you have questions. COMMON BRAND NAME(S): Alymsys, Avastin, MVASI, Noah Charon What should I tell my care team before I take this medication? They need to know if you have any of these conditions: Blood clots Coughing up blood Having or recent surgery Heart failure High blood pressure History of a connection between 2 or more body parts that do not usually connect (fistula) History of a tear in your stomach or intestines Protein in your urine An unusual or allergic reaction to bevacizumab, other medications, foods, dyes, or preservatives Pregnant or trying to get pregnant Breast-feeding How should I use this medication? This medication is injected into a vein. It is given by your care team in a hospital or clinic setting. Talk to your care team the use of this medication in children. Special care may be needed. Overdosage: If you think you have taken too much of this medicine contact a poison  control center or emergency room at once. NOTE: This medicine is only for you. Do not share this medicine with others. What if I miss a dose? Keep appointments for follow-up doses. It is important not to miss your dose. Call your care team if you are unable to keep an appointment. What may interact with this medication? Interactions are not expected. This list may not describe all possible interactions. Give your health care provider a list of all the medicines, herbs, non-prescription drugs, or dietary supplements you use. Also tell them if you smoke, drink  alcohol, or use illegal drugs. Some items may interact with your medicine. What should I watch for while using this medication? Your condition will be monitored carefully while you are receiving this medication. You may need blood work while taking this medication. This medication may make you feel generally unwell. This is not uncommon as chemotherapy can affect healthy cells as well as cancer cells. Report any side effects. Continue your course of treatment even though you feel ill unless your care team tells you to stop. This medication may increase your risk to bruise or bleed. Call your care team if you notice any unusual bleeding. Before having surgery, talk to your care team to make sure it is ok. This medication can increase the risk of poor healing of your surgical site or wound. You will need to stop this medication for 28 days before surgery. After surgery, wait at least 28 days before restarting this medication. Make sure the surgical site or wound is healed enough before restarting this medication. Talk to your care team if questions. Talk to your care team if you may be pregnant. Serious birth defects can occur if you take this medication during pregnancy and for 6 months after the last dose. Contraception is recommended while taking this medication and for 6 months after the last dose. Your care team can help you find the option that works for you. Do not breastfeed while taking this medication and for 6 months after the last dose. This medication can cause infertility. Talk to your care team if you are concerned about your fertility. What side effects may I notice from receiving this medication? Side effects that you should report to your care team as soon as possible: Allergic reactions--skin rash, itching, hives, swelling of the face, lips, tongue, or throat Bleeding--bloody or black, tar-like stools, vomiting blood or brown material that looks like coffee grounds, red or dark brown  urine, small red or purple spots on skin, unusual bruising or bleeding Blood clot--pain, swelling, or warmth in the leg, shortness of breath, chest pain Heart attack--pain or tightness in the chest, shoulders, arms, or jaw, nausea, shortness of breath, cold or clammy skin, feeling faint or lightheaded Heart failure--shortness of breath, swelling of the ankles, feet, or hands, sudden weight gain, unusual weakness or fatigue Increase in blood pressure Infection--fever, chills, cough, sore throat, wounds that don't heal, pain or trouble when passing urine, general feeling of discomfort or being unwell Infusion reactions--chest pain, shortness of breath or trouble breathing, feeling faint or lightheaded Kidney injury--decrease in the amount of urine, swelling of the ankles, hands, or feet Stomach pain that is severe, does not go away, or gets worse Stroke--sudden numbness or weakness of the face, arm, or leg, trouble speaking, confusion, trouble walking, loss of balance or coordination, dizziness, severe headache, change in vision Sudden and severe headache, confusion, change in vision, seizures, which may be signs of posterior reversible encephalopathy syndrome (PRES)  Side effects that usually do not require medical attention (report to your care team if they continue or are bothersome): Back pain Change in taste Diarrhea Dry skin Increased tears Nosebleed This list may not describe all possible side effects. Call your doctor for medical advice about side effects. You may report side effects to FDA at 1-800-FDA-1088. Where should I keep my medication? This medication is given in a hospital or clinic. It will not be stored at home. NOTE: This sheet is a summary. It may not cover all possible information. If you have questions about this medicine, talk to your doctor, pharmacist, or health care provider.  2023 [employer]/Gold Standard (2021-08-26 00:00:00)

## 2022-05-04 ENCOUNTER — Inpatient Hospital Stay: Payer: BC Managed Care – PPO

## 2022-05-04 VITALS — BP 151/101 | HR 81 | Temp 98.1°F | Resp 18 | Ht 78.0 in | Wt 277.0 lb

## 2022-05-04 DIAGNOSIS — C187 Malignant neoplasm of sigmoid colon: Secondary | ICD-10-CM

## 2022-05-04 DIAGNOSIS — C787 Secondary malignant neoplasm of liver and intrahepatic bile duct: Secondary | ICD-10-CM

## 2022-05-04 DIAGNOSIS — Z5112 Encounter for antineoplastic immunotherapy: Secondary | ICD-10-CM | POA: Diagnosis not present

## 2022-05-04 MED ORDER — HEPARIN SOD (PORK) LOCK FLUSH 100 UNIT/ML IV SOLN
500.0000 [IU] | Freq: Once | INTRAVENOUS | Status: AC | PRN
  Administered 2022-05-04: 500 [IU]

## 2022-05-04 MED ORDER — SODIUM CHLORIDE 0.9% FLUSH
10.0000 mL | INTRAVENOUS | Status: DC | PRN
  Administered 2022-05-04: 10 mL

## 2022-05-04 NOTE — Patient Instructions (Signed)
The chemotherapy medication bag should finish at 46 hours, 96 hours, or 7 days. For example, if your pump is scheduled for 46 hours and it was put on at 4:00 p.m., it should finish at 2:00 p.m. the day it is scheduled to come off regardless of your appointment time.     Estimated time to finish at 1325.   If the display on your pump reads "Low Volume" and it is beeping, take the batteries out of the pump and come to the cancer center for it to be taken off.   If the pump alarms go off prior to the pump reading "Low Volume" then call [phone] and someone can assist you.  If the plunger comes out and the chemotherapy medication is leaking out, please use your home chemo spill kit to clean up the spill. Do NOT use paper towels or other household products.  If you have problems or questions regarding your pump, please call either [phone] (24 hours a day) or the cancer center Monday-Friday 8:00 a.m.- 4:30 p.m. at the clinic number and we will assist you. If you are unable to get assistance, then go to the nearest Emergency Department and ask the staff to contact the IV team for assistance.   Fluorouracil Injection What is this medication? FLUOROURACIL (flure oh YOOR a sil) treats some types of cancer. It works by slowing down the growth of cancer cells. This medicine may be used for other purposes; ask your health care provider or pharmacist if you have questions. COMMON BRAND NAME(S): Adrucil What should I tell my care team before I take this medication? They need to know if you have any of these conditions: Blood disorders Dihydropyrimidine dehydrogenase (DPD) deficiency Infection, such as chickenpox, cold sores, herpes Kidney disease Liver disease Poor nutrition Recent or ongoing radiation therapy An unusual or allergic reaction to fluorouracil, other medications, foods, dyes, or preservatives If you or your partner are pregnant or trying to get pregnant Breast-feeding How  should I use this medication? This medication is injected into a vein. It is administered by your care team in a hospital or clinic setting. Talk to your care team about the use of this medication in children. Special care may be needed. Overdosage: If you think you have taken too much of this medicine contact a poison control center or emergency room at once. NOTE: This medicine is only for you. Do not share this medicine with others. What if I miss a dose? Keep appointments for follow-up doses. It is important not to miss your dose. Call your care team if you are unable to keep an appointment. What may interact with this medication? Do not take this medication with any of the following: Live virus vaccines This medication may also interact with the following: Medications that treat or prevent blood clots, such as warfarin, enoxaparin, dalteparin This list may not describe all possible interactions. Give your health care provider a list of all the medicines, herbs, non-prescription drugs, or dietary supplements you use. Also tell them if you smoke, drink alcohol, or use illegal drugs. Some items may interact with your medicine. What should I watch for while using this medication? Your condition will be monitored carefully while you are receiving this medication. This medication may make you feel generally unwell. This is not uncommon as chemotherapy can affect healthy cells as well as cancer cells. Report any side effects. Continue your course of treatment even though you feel ill unless your care team tells you to  stop. In some cases, you may be given additional medications to help with side effects. Follow all directions for their use. This medication may increase your risk of getting an infection. Call your care team for advice if you get a fever, chills, sore throat, or other symptoms of a cold or flu. Do not treat yourself. Try to avoid being around people who are sick. This medication may  increase your risk to bruise or bleed. Call your care team if you notice any unusual bleeding. Be careful brushing or flossing your teeth or using a toothpick because you may get an infection or bleed more easily. If you have any dental work done, tell your dentist you are receiving this medication. Avoid taking medications that contain aspirin, acetaminophen, ibuprofen, naproxen, or ketoprofen unless instructed by your care team. These medications may hide a fever. Do not treat diarrhea with over the counter products. Contact your care team if you have diarrhea that lasts more than 2 days or if it is severe and watery. This medication can make you more sensitive to the sun. Keep out of the sun. If you cannot avoid being in the sun, wear protective clothing and sunscreen. Do not use sun lamps, tanning beds, or tanning booths. Talk to your care team if you or your partner wish to become pregnant or think you might be pregnant. This medication can cause serious birth defects if taken during pregnancy and for 3 months after the last dose. A reliable form of contraception is recommended while taking this medication and for 3 months after the last dose. Talk to your care team about effective forms of contraception. Do not father a child while taking this medication and for 3 months after the last dose. Use a condom while having sex during this time period. Do not breastfeed while taking this medication. This medication may cause infertility. Talk to your care team if you are concerned about your fertility. What side effects may I notice from receiving this medication? Side effects that you should report to your care team as soon as possible: Allergic reactions--skin rash, itching, hives, swelling of the face, lips, tongue, or throat Heart attack--pain or tightness in the chest, shoulders, arms, or jaw, nausea, shortness of breath, cold or clammy skin, feeling faint or lightheaded Heart failure--shortness of  breath, swelling of the ankles, feet, or hands, sudden weight gain, unusual weakness or fatigue Heart rhythm changes--fast or irregular heartbeat, dizziness, feeling faint or lightheaded, chest pain, trouble breathing High ammonia level--unusual weakness or fatigue, confusion, loss of appetite, nausea, vomiting, seizures Infection--fever, chills, cough, sore throat, wounds that don't heal, pain or trouble when passing urine, general feeling of discomfort or being unwell Low red blood cell level--unusual weakness or fatigue, dizziness, headache, trouble breathing Pain, tingling, or numbness in the hands or feet, muscle weakness, change in vision, confusion or trouble speaking, loss of balance or coordination, trouble walking, seizures Redness, swelling, and blistering of the skin over hands and feet Severe or prolonged diarrhea Unusual bruising or bleeding Side effects that usually do not require medical attention (report to your care team if they continue or are bothersome): Dry skin Headache Increased tears Nausea Pain, redness, or swelling with sores inside the mouth or throat Sensitivity to light Vomiting This list may not describe all possible side effects. Call your doctor for medical advice about side effects. You may report side effects to FDA at 1-800-FDA-1088. Where should I keep my medication? This medication is given in a hospital  or clinic. It will not be stored at home. NOTE: This sheet is a summary. It may not cover all possible information. If you have questions about this medicine, talk to your doctor, pharmacist, or health care provider.  2023 Elsevier/Gold Standard (2021-08-23 00:00:00)  Eduardo Hester  Discharge Instructions: Thank you for choosing [facility] to provide your oncology and hematology care.  If you have a lab appointment with the [facility], please go directly to the [facility] and check in at the registration  area.   Wear comfortable clothing and clothing appropriate for easy access to any Portacath or PICC line.   We strive to give you quality time with your provider. You may need to reschedule your appointment if you arrive late (15 or more minutes).  Arriving late affects you and other patients whose appointments are after yours.  Also, if you miss three or more appointments without notifying the office, you may be dismissed from the clinic at the provider's discretion.      For prescription refill requests, have your pharmacy contact our office and allow 72 hours for refills to be completed.    Today you received the following chemotherapy and/or immunotherapy agents FLOURUORACIL      To help prevent nausea and vomiting after your treatment, we encourage you to take your nausea medication as directed.  BELOW ARE SYMPTOMS THAT SHOULD BE REPORTED IMMEDIATELY: *FEVER GREATER THAN 100.4 F (38 C) OR HIGHER *CHILLS OR SWEATING *NAUSEA AND VOMITING THAT IS NOT CONTROLLED WITH YOUR NAUSEA MEDICATION *UNUSUAL SHORTNESS OF BREATH *UNUSUAL BRUISING OR BLEEDING *URINARY PROBLEMS (pain or burning when urinating, or frequent urination) *BOWEL PROBLEMS (unusual diarrhea, constipation, pain near the anus) TENDERNESS IN MOUTH AND THROAT WITH OR WITHOUT PRESENCE OF ULCERS (sore throat, sores in mouth, or a toothache) UNUSUAL RASH, SWELLING OR PAIN  UNUSUAL VAGINAL DISCHARGE OR ITCHING   Items with * indicate a potential emergency and should be followed up as soon as possible or go to the Emergency Department if any problems should occur.  Please show the CHEMOTHERAPY ALERT CARD or IMMUNOTHERAPY ALERT CARD at check-in to the Emergency Department and triage nurse.  Should you have questions after your visit or need to cancel or reschedule your appointment, please contact [facility]  Dept: [phone]  and follow the prompts.  Office hours are 8:00 a.m. to 4:30 p.m. Monday -  Friday. Please note that voicemails left after 4:00 p.m. may not be returned until the following business day.  We are closed weekends and major holidays. You have access to a nurse at all times for urgent questions. Please call the main number to the clinic Dept: [phone] and follow the prompts.  For any non-urgent questions, you may also contact your provider using MyChart. We now offer e-Visits for anyone 33 and older to request care online for non-urgent symptoms. For details visit [email or web address].   Also download the MyChart app! Go to the app store, search "MyChart", open the app, select [facility], and log in with your MyChart username and password.

## 2022-05-10 ENCOUNTER — Other Ambulatory Visit: Payer: Self-pay | Admitting: Oncology

## 2022-05-10 DIAGNOSIS — C787 Secondary malignant neoplasm of liver and intrahepatic bile duct: Secondary | ICD-10-CM

## 2022-05-10 DIAGNOSIS — C187 Malignant neoplasm of sigmoid colon: Secondary | ICD-10-CM

## 2022-05-12 ENCOUNTER — Inpatient Hospital Stay: Payer: BC Managed Care – PPO | Attending: Oncology

## 2022-05-12 ENCOUNTER — Other Ambulatory Visit: Payer: Self-pay | Admitting: Oncology

## 2022-05-12 ENCOUNTER — Inpatient Hospital Stay: Payer: BC Managed Care – PPO | Admitting: Oncology

## 2022-05-12 ENCOUNTER — Encounter: Payer: Self-pay | Admitting: Oncology

## 2022-05-12 VITALS — BP 153/111 | HR 79 | Temp 98.0°F | Resp 18 | Ht 78.0 in | Wt 273.0 lb

## 2022-05-12 DIAGNOSIS — Z5112 Encounter for antineoplastic immunotherapy: Secondary | ICD-10-CM | POA: Insufficient documentation

## 2022-05-12 DIAGNOSIS — D500 Iron deficiency anemia secondary to blood loss (chronic): Secondary | ICD-10-CM | POA: Diagnosis not present

## 2022-05-12 DIAGNOSIS — Z5111 Encounter for antineoplastic chemotherapy: Secondary | ICD-10-CM | POA: Insufficient documentation

## 2022-05-12 DIAGNOSIS — C187 Malignant neoplasm of sigmoid colon: Secondary | ICD-10-CM

## 2022-05-12 DIAGNOSIS — C787 Secondary malignant neoplasm of liver and intrahepatic bile duct: Secondary | ICD-10-CM | POA: Insufficient documentation

## 2022-05-12 DIAGNOSIS — I159 Secondary hypertension, unspecified: Secondary | ICD-10-CM

## 2022-05-12 DIAGNOSIS — Z79899 Other long term (current) drug therapy: Secondary | ICD-10-CM | POA: Diagnosis not present

## 2022-05-12 DIAGNOSIS — Z452 Encounter for adjustment and management of vascular access device: Secondary | ICD-10-CM | POA: Diagnosis not present

## 2022-05-12 LAB — CBC: RBC: 4.51 (ref 3.87–5.11)

## 2022-05-12 LAB — CBC AND DIFFERENTIAL
HCT: 39 — AB (ref 41–53)
Hemoglobin: 13.4 — AB (ref 13.5–17.5)
Neutrophils Absolute: 1.82
Platelets: 203 10*3/uL (ref 150–400)
WBC: 3.8

## 2022-05-12 LAB — TOTAL PROTEIN, URINE DIPSTICK: Protein, ur: NEGATIVE mg/dL

## 2022-05-12 MED ORDER — LISINOPRIL 20 MG PO TABS: 20.0000 mg | ORAL_TABLET | Freq: Every day | ORAL | 5 refills | Status: DC

## 2022-05-12 NOTE — Progress Notes (Signed)
[place]  [address] [place],  [place]  [address] [phone]  Clinic Day:05/12/22    Referring physician: Angelina Sheriff, MD  ASSESSMENT & PLAN:   Assessment & Plan: Malignant neoplasm metastatic to liver [facility]) History of liver metastasis in January 2021 treated with resection alone.  He had recurrent liver metastasis treated with a partial hepatectomy in June.  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  There was 1 negative node but margins were close.  This was attached to the diaphragm but the tissue from the diaphragm is benign.  He had a complicated postoperative course developing an cyst requiring drainage.  He then developed a right pleural effusion in July treated with a VATS procedure. He did have postoperative complications including biliary leak and had ERCP with placement of an endoscopic stent into the biliary duct.  He was readmitted on June 27 with abscess and sepsis and perihepatic fluid collection that required placement of a drain. He is receiving chemotherapy with FOLFIRI and Avastin, which he started in August.  His third cycle of chemotherapy was delayed as he was admitted with sepsis.  He completed IV antibiotics as an outpatient. CT abdomen pelvis from 01/07/2022 showed 12 mm short axis lymph node identified in the region of the porta hepatis. We repeated his MRI abdomen 04/12/22 with no evidence of intrahepatic or extrahepatic abdominal metastasis.   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  He was treated with surgical resection followed by 6 months of adjuvant chemotherapy with FOLFOX. He had ascites and peritoneal nodules in 2018 with negative biopsy and negative fluid cytology.  He was taken for exploratory laparotomy with removal of the nodule which is found to be benign.  He was found to have a solitary metastasis to the liver  in January 2021 treated with surgical resection alone.  We discussed postoperative chemotherapy at that time, but ultimately decided on surveillance.     Iron deficiency anemia due to chronic blood loss Microcytic anemia, which is stable. He denies any overt form of blood loss. Iron studies were equivocal in July. His HGB today is 13.3. We will stop ferrous sulfate when he completes this bottle.    Plan:  He will return in 2 weeks with CBC and CMP for his 12th and final cycle. I will see him back in 6 weeks with CBC, CMP,  and CEA on the 06/23/22 and plan a PET scan for him on 06/21/22. His MRI of the abdomen in December, 2023 revealed no metastatic disease within or outside the liver. If his PET scan looks good we can plan maintenance therapy with 5FU and Avastin and drop the Irinotecan. He has asked whether he will be able to get this every 3 weeks as he hopes to return to work. We will treat his hypertension with Lisinopril '20mg'$  daily and I've asked him to monitor his blood pressure. The patient understands the plans discussed today and is in agreement with them.  He knows to contact our office if he develops concerns prior to his next appointment.    I provided 25 minutes of face-to-face time during this encounter and > 50% was spent counseling as documented under my assessment and plan.    Derwood Kaplan, MD  [facility] AT [facility] [address] [place] [address] Dept: [phone] Dept Fax: [phone]  Orders Placed This Encounter  Procedures   CBC and differential    This external order was created through the Results Console.   CBC    This external order was created through the Results Console.      CHIEF COMPLAINT:  CC: Recurrent colon cancer  Current Treatment: FOLFIRI/bevacizumab  HISTORY OF PRESENT ILLNESS:  The patient is a 52 y.o. 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  He presented  with fevers and chills in September 2017 and was found to have diverticulitis, which was treated.  CT at that time revealed a 1.7 cm lesion in the liver, which was nonspecific, as well as ectopic left kidney with a cyst.  He was then brought back for a colonoscopy, which revealed a 3.5 cm mass in the sigmoid.  Biopsy revealed high-grade dysplasia.  CT abdomen and pelvis revealed an indeterminate lesion in the liver in addition to wall thickening in the sigmoid colon.  MRI abdomen revealed the lesion in the liver to represent complex cyst versus vascular lesion, however, follow-up in 6 months was recommended.  Air contrast barium enema revealed an apple-core lesion of the sigmoid colon.  Baseline CEA was 0.6.  He underwent surgical resection in November 2017.  Pathology revealed a 4 cm, grade 2, adenocarcinoma of the sigmoid colon, as well as chronic active diverticulitis with an area of perforation, however, the tumor was not perforated.  Tumor invaded through the muscularis propria into the pericolorectal tissues.  4/21 nodes were positive for metastasis.  Margins were clear.  MMR and MSI were normal.  KRAS and BRAF mutations were negative.  He had iron deficiency treated with oral iron supplement in the form of Hemocyte daily.  Due to his age of diagnosis and family history he underwent testing for hereditary non polyposis colorectal cancer with the Myriad myRisk Hereditary Cancer Panel test.  This did not reveal any clinically significant mutation or variants of uncertain significance.  Repeat MRI abdomen in January 2018 revealed a stable lesion within the right lobe of the liver, most consistent with benign lesion.  There was new small volume ascites seen.  The patient received adjuvant chemotherapy with FOLFOX  for 12 cycles, which was completed in June.  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  He was placed on gabapentin in July, then  noticed abdominal bloating, which he attributed to the gabapentin, so he discontinued that after 1 month.  He was seen for routine follow-up in September 2018 with a repeat MRI abdomen to re-evaluate the liver lesion.  That revealed a large volume ascites with findings suspicious for peritoneal disease.  The lesion in the right lobe of the liver was stable and still assistant with a benign lesion.  The patient underwent paracentesis with removal of 4.7 L of fluid, but fluid cytology was negative for malignancy.  PET scan in early October did not reveal any hypermetabolic activity, but moderate ascites was seen.  Ultrasound-guided peritoneal biopsy was negative for malignancy, revealing adipose tissue with fibrosis, patchy inflammation and minimal atypia.  His case was discussed at tumor conference and he was referred to Dr. Noberto Retort for consideration open biopsy.  Dr. Noberto Retort performed colonoscopy in October 2018, which was negative.  Follow-up colonoscopy in 3 years was recommended.  The patient then underwent exploratory laparotomy with multiple peritoneal biopsies.  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  We therefore recommended observation for the patient.  In November 2018, he was seen in the emergency room with abdominal pain, nausea and vomiting.  Gallbladder ultrasound revealed cholelithiasis with sludge and polyps, as well as mild gallbladder wall thickening.  Mild ascites was also seen.  CT abdomen and pelvis revealed mild diffuse gallbladder wall edema otherwise unchanged from PET-CT done in October.  He underwent cholecystectomy with findings of acute cholecystitis.  Pathology did not reveal any evidence of malignancy.    He was seen off schedule in April 2019, as he presented to Dr. Janace Aris office with abdominal pain and was found to have an elevated bilirubin and liver  transaminases.  Due to the laboratory abnormalities, he underwent repeat MRI abdomen at that time, which revealed a stable lesion in the right hepatic lobe, which was felt to be indeterminate, with a 2nd smaller indeterminate lesion in the right hepatic lobe measuring 12 mm, which was new from previous imaging.  No hepatic steatosis or ascites was seen.  CEA was normal.  Hepatitis panel and CMV were negative  we felt the laboratory abnormalities were most likely secondary to viral illness.  He was followed closely and underwent a repeat MRI abdomen in June, which remained stable. He had one again in December of 2019, which was stable.  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  MRI abdomen in January 2021 revealed the mass in the junction of segments 7 and 8 in the liver has increased in size compared to the prior exam, currently 2.9 x 2.1 x 2.5 cm.  The lesion has a branching component, some of which may be from portal vein thrombus or tumor thrombus, even this branching component appears to enhance on subtraction images.   Biopsy in February revealed adenocarcinoma with necrosis, consistent with metastasis from colon primary.  He underwent surgical resection in February with Dr. Rolla Etienne at [facility].  We discussed the option of further chemotherapy, including the risks and benefits, and he opted for surveillance.  MRI abdomen in May 2023 revealed another solitary metastasis in the liver.  Treated with partial hepatectomy in June with Dr. Cecil Cobbs.  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  There was 1 negative node, but margins were close.  This was attached to the diaphragm, but the tissue from the diaphragm  was benign.  He had a complicated postoperative course developing an cyst requiring drainage.  He then developed a right pleural effusion in July treated with a VATS procedure.  He is receiving chemotherapy with FOLFIRI and Avastin, which he started in August   Oncology History  Colon cancer [facility])  01/23/2016 Cancer Staging   Staging form: Colon and Rectum, AJCC 7th Edition - Clinical stage from 01/23/2016: Stage IIIB (T3, N2a, M0) - Signed by Derwood Kaplan, MD on 05/03/2020 Staging comments: 3.5 cm MMR, MSI normal, No mutation of KRAS or BRAF, neg.genetics Rec'd 6 months adjuvant FOLFOX chemotherapy   02/02/2016 Initial Diagnosis   Colon cancer ([place])   12/13/2021 - 12/29/2021 Chemotherapy   Patient is on Treatment Plan : COLORECTAL FOLFIRI / BEVACIZUMAB Q14D     12/13/2021 -  Chemotherapy  Patient is on Treatment Plan : COLORECTAL FOLFIRI + Bevacizumab q14d     Malignant neoplasm metastatic to liver ([place])  05/29/2019 Initial Diagnosis   Malignant neoplasm metastatic to liver ([place])   12/13/2021 - 12/29/2021 Chemotherapy   Patient is on Treatment Plan : COLORECTAL FOLFIRI / BEVACIZUMAB Q14D     12/13/2021 -  Chemotherapy   Patient is on Treatment Plan : COLORECTAL FOLFIRI + Bevacizumab q14d     01/07/2022 Imaging   CT abdomen and pelvis:  IMPRESSION:  1. Interval right hepatectomy with surgical changes in the dome of  the left liver.  2. 12 mm short axis lymph node identified in the region of the porta  hepatis. Attention on follow-up recommended.  3. Left pelvic kidney with large simple appearing cyst measuring  12.5 cm, increased from 7.8 cm on 03/14/2017.  4. No acute findings in the abdomen or pelvis. Specifically, no  findings to explain the patient's history of abdominal pain and  fever.        INTERVAL HISTORY:  Ramesh is here today for repeat clinical assessment prior to his 11th cycle of FOLFIRI/bevacizumab for recurrent liver metastases. He reports that he is doing  well however, his blood pressure has been consistently high. I will prescribe Lisinopril '20mg'$  daily and advised him to keep his BP monitored.  He states his bowels have improved and he uses MiraLAX as needed. He will be returning for Day 1 Cycle 11 on 05/16/22. His CBC results are unremarkable. I discussed continuing maintenance chemotherapy after his PET scan, and he is requesting every 3 weeks. He will return in 2 weeks with CBC and CMP for his 12th and final cycle. I will see him back in 6 weeks with CBC, CMP,  and CEA on 06/23/22 and plan a PET scan for him on 06/21/22. I have recommended a maintenance treatment with 5FU and bevacizumab and we will drop the Irinotecan. They are asking if he can do this every 3 weeks and I think this is reasonable if his PET scan looks good. I advised he can stop his iron after he finishes this bottle.  He denies signs of infection such as sore throat, sinus drainage, cough, or urinary symptoms.  He denies fevers or recurrent chills. He denies pain. He denies nausea, vomiting, chest pain, dyspnea or cough. His weight has been stable.  REVIEW OF SYSTEMS:  Review of Systems  Constitutional: Negative.  Negative for appetite change, chills, diaphoresis, fatigue, fever and unexpected weight change.  HENT:  Negative.  Negative for hearing loss, lump/mass, mouth sores, nosebleeds, sore throat, tinnitus, trouble swallowing and voice change.   Eyes: Negative.  Negative for eye problems and icterus.  Respiratory: Negative.  Negative for chest tightness, cough, hemoptysis, shortness of breath and wheezing.   Cardiovascular: Negative.  Negative for chest pain, leg swelling and palpitations.  Gastrointestinal: Negative.  Negative for abdominal distention, abdominal pain, blood in stool, constipation, diarrhea, nausea, rectal pain and vomiting.  Endocrine: Negative.  Negative for hot flashes.  Genitourinary: Negative.  Negative for bladder incontinence, difficulty urinating,  dyspareunia, dysuria, frequency, hematuria, nocturia, pelvic pain and penile discharge.   Musculoskeletal:  Negative for arthralgias, back pain, flank pain, gait problem, myalgias, neck pain and neck stiffness.  Skin: Negative.  Negative for itching, rash and wound.  Neurological: Negative.  Negative for dizziness, extremity weakness, gait problem, headaches, light-headedness, numbness, seizures and speech difficulty.  Hematological: Negative.  Negative for adenopathy. Does not bruise/bleed easily.  Psychiatric/Behavioral: Negative.  Negative for confusion, decreased concentration, depression, sleep disturbance and suicidal ideas. The patient is not nervous/anxious.   All other systems reviewed and are negative.    VITALS:  Blood pressure (!) 153/111, pulse 79, temperature 98 F (36.7 C), temperature source Oral, resp. rate 18, height '6\' 6"'$  (1.981 m), weight 273 lb (123.8 kg), SpO2 100 %.  Wt Readings from Last 3 Encounters:  05/30/22 276 lb (125.2 kg)  05/26/22 267 lb 12.8 oz (121.5 kg)  05/18/22 274 lb 12 oz (124.6 kg)    Body mass index is 31.55 kg/m.  Performance status (ECOG): 1 - Symptomatic but completely ambulatory  PHYSICAL EXAM:  Physical Exam Vitals and nursing note reviewed. Exam conducted with a chaperone present.  Constitutional:      General: He is not in acute distress.    Appearance: Normal appearance. He is normal weight. He is not ill-appearing, toxic-appearing or diaphoretic.  HENT:     Head: Normocephalic and atraumatic.     Right Ear: Tympanic membrane, ear canal and external ear normal. There is no impacted cerumen.     Left Ear: Tympanic membrane, ear canal and external ear normal. There is no impacted cerumen.     Nose: Nose normal. No congestion or rhinorrhea.     Mouth/Throat:     Mouth: Mucous membranes are moist.     Pharynx: Oropharynx is clear. No oropharyngeal exudate or posterior oropharyngeal erythema.  Eyes:     General: No scleral icterus.        Right eye: No discharge.        Left eye: No discharge.     Extraocular Movements: Extraocular movements intact.     Conjunctiva/sclera: Conjunctivae normal.     Pupils: Pupils are equal, round, and reactive to light.  Neck:     Vascular: No carotid bruit.  Cardiovascular:     Rate and Rhythm: Normal rate and regular rhythm.     Pulses: Normal pulses.     Heart sounds: Normal heart sounds. No murmur heard.    No friction rub. No gallop.  Pulmonary:     Effort: Pulmonary effort is normal. No respiratory distress.     Breath sounds: Normal breath sounds. No stridor. No wheezing, rhonchi or rales.  Chest:     Chest wall: No tenderness.  Abdominal:     General: Bowel sounds are normal. There is no distension.     Palpations: Abdomen is soft. There is no hepatomegaly, splenomegaly or mass.     Tenderness: There is no abdominal tenderness. There is no right CVA tenderness, left CVA tenderness, guarding or rebound.     Hernia: No hernia is present.  Musculoskeletal:        General: No swelling, tenderness, deformity or signs of injury. Normal range of motion.     Cervical back: Normal range of motion and neck supple. No rigidity or tenderness.     Right lower leg: No edema.     Left lower leg: No edema.  Lymphadenopathy:     Cervical: No cervical adenopathy.     Upper Body:     Right upper body: No supraclavicular or axillary adenopathy.     Left upper body: No supraclavicular or axillary adenopathy.     Lower Body: No right inguinal adenopathy. No left inguinal adenopathy.  Skin:    General: Skin is warm and dry.     Coloration: Skin is not jaundiced or pale.     Findings: No bruising, erythema, lesion or  rash.  Neurological:     General: No focal deficit present.     Mental Status: He is alert and oriented to person, place, and time. Mental status is at baseline.     Cranial Nerves: No cranial nerve deficit.     Sensory: No sensory deficit.     Motor: No weakness.      Coordination: Coordination normal.     Gait: Gait normal.     Deep Tendon Reflexes: Reflexes normal.  Psychiatric:        Mood and Affect: Mood normal.        Behavior: Behavior normal.        Thought Content: Thought content normal.        Judgment: Judgment normal.    LABS:      Latest Ref Rng & Units 05/26/2022   12:00 AM 05/12/2022   12:00 AM 04/28/2022   12:00 AM  CBC  WBC  4.0     3.8     3.8      Hemoglobin 13.5 - 17.5 13.5     13.4     14.2      Hematocrit 41 - 53 41     39     40      Platelets 150 - 400 K/uL 210     203     234         This result is from an external source.      Latest Ref Rng & Units 04/28/2022   12:00 AM 04/14/2022    2:41 PM 03/29/2022    1:02 PM  CMP  Glucose 70 - 99 mg/dL  116  105   BUN 4 - '21 14     8  14   '$ Creatinine 0.6 - 1.3 1.0     1.09  0.95   Sodium 137 - 147 138     141  133   Potassium 3.5 - 5.1 mEq/L 4.5     4.3  4.2   Chloride 99 - 108 101     105  99   CO2 13 - '22 25     26  27   '$ Calcium 8.7 - 10.7 8.9     8.9  9.2   Total Protein 6.5 - 8.1 g/dL  6.0  7.1   Total Bilirubin 0.3 - 1.2 mg/dL  0.6  0.7   Alkaline Phos 25 - 125 70     63  64   AST 14 - 40 33     19  18   ALT 10 - 40 U/L '18     17  15      '$ This result is from an external source.   Lab Results  Component Value Date   CEA1 0.8 01/06/2022   /  CEA  Date Value Ref Range Status  01/06/2022 0.8 0.0 - 4.7 ng/mL Final    Comment:    (NOTE)                             Nonsmokers          <3.9                             Smokers             <5.6 [employer] Electrochemiluminescence Immunoassay (ECLIA) Values obtained with different assay methods or  kits cannot be used interchangeably.  Results cannot be interpreted as absolute evidence of the presence or absence of malignant disease. Performed At: [facility] [place], [place] [id number] Rush Farmer MD [id number]    No results found for: "PSA1" No results found for:  "CAN199" No results found for: "CAN125"  No results found for: "TOTALPROTELP", "ALBUMINELP", "A1GS", "A2GS", "BETS", "BETA2SER", "GAMS", "MSPIKE", "SPEI" Lab Results  Component Value Date   TIBC 225 (L) 11/22/2021   FERRITIN 292 11/22/2021   IRONPCTSAT 6 (L) 11/22/2021   No results found for: "LDH"  STUDIES:  No results found.   CT abdomen and pelvis 01/07/2022 IMPRESSION:  1. Interval right hepatectomy with surgical changes in the dome of  the left liver.  2. 12 mm short axis lymph node identified in the region of the porta  hepatis. Attention on follow-up recommended.  3. Left pelvic kidney with large simple appearing cyst measuring  12.5 cm, increased from 7.8 cm on 03/14/2017.  4. No acute findings in the abdomen or pelvis. Specifically, no  findings to explain the patient's history of abdominal pain and  fever.   NUCLEAR MEDICINE PET SKULL BASE TO THIGH 10/07/2021 TECHNIQUE: 14.0 mCi F-18 FDG was injected intravenously. Full-ring PET imaging was performed from the skull base to thigh after the radiotracer. CT data was obtained and used for attenuation correction and anatomic localization.   Fasting blood glucose: 110 mg/dl   COMPARISON:  MRI 09/29/2021 and PET-CT 05/30/2019. Calcified granuloma identified in the right middle lobe, image 44/7.   FINDINGS: Mediastinal blood pool activity: SUV max 2.86   Liver activity: SUV max NA   NECK: No hypermetabolic lymph nodes in the neck.   Incidental CT findings: none   CHEST: No hypermetabolic mediastinal or hilar nodes. No suspicious pulmonary nodules on the CT scan.   Incidental CT findings: none   ABDOMEN/PELVIS: Tracer avid lesion within the dome of liver has an SUV max of 6.41, image 107 of the fused PET-CT images. This is not visible on the corresponding unenhanced CT images. On the PET-CT from 05/30/2019 this had an SUV max of 4.3. On the recent MRI of the abdomen this measured 2.2 x 2.5 x 2.7 cm. No additional  abnormal foci of increased radiotracer uptake within the liver.   No abnormal uptake identified within the pancreas, spleen or adrenal glands. No tracer avid right abdominopelvic lymph nodes. Anastomotic suture line is identified at the rectosigmoid junction. No signs of locally recurrent tumor.   Incidental CT findings: Left pelvic kidney with large Bosniak class 1 exophytic cyst is again noted. No follow-up recommended.   SKELETON: No focal hypermetabolic activity to suggest skeletal metastasis.   Incidental CT findings: none   IMPRESSION: 1. Increased radiotracer uptake is identified within the dome of liver corresponding to the enhancing liver lesion on recent MRI of the abdomen. 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 2. No additional sites of FDG avid disease.   MRI ABDOMEN WITHOUT AND WITH CONTRAST 09/29/2021 TECHNIQUE: Multiplanar multisequence MR imaging of the abdomen was performed both before and after the administration of intravenous contrast.   CONTRAST:  46m GADAVIST GADOBUTROL 1 MMOL/ML IV SOLN   COMPARISON:  Abdominal MRI 12/30/2020.   FINDINGS: Lower chest: Unremarkable.   Hepatobiliary: Mild diffuse loss of signal intensity throughout the hepatic parenchyma, indicative of a background of mild hepatic steatosis. Again noted are areas of susceptibility artifact in the superior aspect of the right lobe of the liver  related to prior partial hepatectomy. 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 No other definite suspicious hepatic lesions are noted. No intra or extrahepatic biliary ductal dilatation. Status post  cholecystectomy. Common bile duct measures 4 mm in the porta hepatis.   Pancreas: No pancreatic mass. No pancreatic ductal dilatation. No pancreatic or peripancreatic fluid collections or inflammatory changes.   Spleen:  Unremarkable.   Adrenals/Urinary Tract: Left kidney not visualized (previous imaging demonstrates a left pelvic kidney). Right kidney and bilateral adrenal glands are normal in appearance. No right hydroureteronephrosis in the visualized portions of the abdomen.   Stomach/Bowel: Visualized portions are unremarkable.   Vascular/Lymphatic: No aneurysm identified in the visualized abdominal vasculature. No lymphadenopathy noted in the abdomen.   Other: No significant volume of ascites noted in the visualized portions of the peritoneal cavity.   Musculoskeletal: No aggressive appearing osseous lesions are noted in the visualized portions of the skeleton.   IMPRESSION: 1. 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 No new sites of metastatic disease are otherwise noted.   These results will be called to the ordering clinician or representative by the Radiologist Assistant, and communication documented in the PACS or [employer].   HISTORY:   Past Medical History:  Diagnosis Date   GERD (gastroesophageal reflux disease)    Renal lithiasis     Past Surgical History:  Procedure Laterality Date   LIVER RESECTION Right 07/02/2019   LIVER RESECTION Right 10/12/2021   SIGMOIDECTOMY  03/2016    Family History  Problem Relation Age of Onset   Lung cancer Father 63   Leukemia Father 31       acute myelogenous   Colon cancer Sister 70    Social History:  reports that he has quit smoking. He has never used smokeless tobacco. He reports that he does not currently use alcohol. No history on file for drug use.The patient is accompanied by his wife  today.  Allergies: No Known Allergies  Current Medications: Current Outpatient Medications  Medication Sig Dispense Refill   ascorbic acid (VITAMIN C) 1000 MG tablet Take by mouth.     benzonatate (TESSALON) 200 MG capsule Take 1 capsule (200 mg total) by mouth 3 (three) times daily as needed for cough. (Patient not taking: Reported on 03/02/2022) 20 capsule 1   Calcium Polycarbophil (FIBER) 625 MG TABS Take by mouth.     cetirizine (ZYRTEC) 10 MG tablet Take by mouth.     Cholecalciferol (VITAMIN D) 50 MCG (2000 UT) tablet Take by mouth.     Docusate Sodium (DSS) 100 MG CAPS Take 1 capsule by mouth daily.     hydrocortisone (ANUSOL-HC) 25 MG suppository Place 1 suppository (25 mg total) rectally 2 (two) times daily. 12 suppository 1   lisinopril (ZESTRIL) 20 MG tablet Take 1 tablet (20 mg total) by mouth daily. 30 tablet 5   loperamide (IMODIUM) 2 MG capsule Take 1 capsule (2 mg total) by mouth as needed for diarrhea or loose stools. Take 2 at diarrhea onset , then 1 every 2hr until 12hrs with no BM. May take 2 every 4hrs at night. If diarrhea  recurs repeat. 100 capsule 5   LORazepam (ATIVAN) 1 MG tablet Take 1 tablet (1 mg total) by mouth every 6 (six) hours as needed for anxiety or sleep. 100 tablet 0   magic mouthwash (nystatin, lidocaine, diphenhydrAMINE, alum & mag hydroxide) suspension SWISH AND/OR SWALLOW 5 ML BY MOUTH EVERY THREE HOURS IF NEEDED     Misc Natural Products (ELDERBERRY IMMUNE COMPLEX) CHEW Chew 1 Dose by mouth daily.     Multiple Vitamin (MULTI-VITAMIN) tablet Take 1 tablet by mouth daily.     omeprazole (PRILOSEC) 20 MG capsule Take by mouth.     ondansetron (ZOFRAN) 8 MG tablet Take 1 tablet (8 mg total) by mouth 2 (two) times daily as needed for refractory nausea / vomiting. Start on day 3 after chemotherapy. 30 tablet 1   ondansetron (ZOFRAN-ODT) 4 MG disintegrating tablet Take 4 mg by mouth every 8 (eight) hours as needed. (Patient not taking: Reported on 03/02/2022)      oxyCODONE (OXY IR/ROXICODONE) 5 MG immediate release tablet Take by mouth.     Probiotic Product (PROBIOTIC BLEND PO) Take 1 tablet by mouth daily.     prochlorperazine (COMPAZINE) 10 MG tablet Take 1 tablet (10 mg total) by mouth every 6 (six) hours as needed (NAUSEA). (Patient not taking: Reported on 03/02/2022) 30 tablet 1   vitamin B-12 (CYANOCOBALAMIN) 250 MCG tablet Take by mouth.     zinc gluconate 50 MG tablet Take 50 mg by mouth daily.     No current facility-administered medications for this visit.   Facility-Administered Medications Ordered in Other Visits  Medication Dose Route Frequency Provider Last Rate Last Admin   atropine 1 MG/ML injection            palonosetron (ALOXI) 0.25 MG/5ML injection              I,Jasmine M Lassiter,acting as a scribe for Derwood Kaplan, MD.,have documented all relevant documentation on the behalf of Derwood Kaplan, MD,as directed by  Derwood Kaplan, MD while in the presence of Derwood Kaplan, MD.

## 2022-05-14 ENCOUNTER — Other Ambulatory Visit: Payer: Self-pay

## 2022-05-15 ENCOUNTER — Encounter: Payer: Self-pay | Admitting: Oncology

## 2022-05-15 MED FILL — Dexamethasone Sodium Phosphate Inj 100 MG/10ML: INTRAMUSCULAR | Qty: 1 | Status: AC

## 2022-05-15 MED FILL — Fluorouracil IV Soln 2.5 GM/50ML (50 MG/ML): INTRAVENOUS | Qty: 20 | Status: AC

## 2022-05-15 MED FILL — Irinotecan HCl Inj 100 MG/5ML (20 MG/ML): INTRAVENOUS | Qty: 23 | Status: AC

## 2022-05-15 MED FILL — Fluorouracil IV Soln 5 GM/100ML (50 MG/ML): INTRAVENOUS | Qty: 122 | Status: AC

## 2022-05-15 MED FILL — Leucovorin Calcium For Inj 350 MG: INTRAMUSCULAR | Qty: 51 | Status: AC

## 2022-05-15 MED FILL — Bevacizumab-awwb IV Soln 400 MG/16ML (For Infusion): INTRAVENOUS | Qty: 24 | Status: AC

## 2022-05-16 ENCOUNTER — Inpatient Hospital Stay: Payer: BC Managed Care – PPO

## 2022-05-16 VITALS — BP 115/88 | HR 98 | Temp 98.0°F | Resp 18 | Ht 78.0 in | Wt 277.0 lb

## 2022-05-16 DIAGNOSIS — C787 Secondary malignant neoplasm of liver and intrahepatic bile duct: Secondary | ICD-10-CM

## 2022-05-16 DIAGNOSIS — C187 Malignant neoplasm of sigmoid colon: Secondary | ICD-10-CM

## 2022-05-16 DIAGNOSIS — Z5112 Encounter for antineoplastic immunotherapy: Secondary | ICD-10-CM | POA: Diagnosis not present

## 2022-05-16 MED ORDER — LEUCOVORIN CALCIUM INJECTION 350 MG
397.0000 mg/m2 | Freq: Once | INTRAVENOUS | Status: AC
  Administered 2022-05-16: 1020 mg via INTRAVENOUS
  Filled 2022-05-16: qty 51

## 2022-05-16 MED ORDER — SODIUM CHLORIDE 0.9 % IV SOLN
10.0000 mg | Freq: Once | INTRAVENOUS | Status: AC
  Administered 2022-05-16: 10 mg via INTRAVENOUS
  Filled 2022-05-16: qty 10

## 2022-05-16 MED ORDER — SODIUM CHLORIDE 0.9 % IV SOLN: Freq: Once | INTRAVENOUS | Status: AC

## 2022-05-16 MED ORDER — PALONOSETRON HCL INJECTION 0.25 MG/5ML
0.2500 mg | Freq: Once | INTRAVENOUS | Status: AC
  Administered 2022-05-16: 0.25 mg via INTRAVENOUS
  Filled 2022-05-16: qty 5

## 2022-05-16 MED ORDER — SODIUM CHLORIDE 0.9 % IV SOLN
2375.0000 mg/m2 | INTRAVENOUS | Status: DC
  Administered 2022-05-16: 6100 mg via INTRAVENOUS
  Filled 2022-05-16: qty 122

## 2022-05-16 MED ORDER — SODIUM CHLORIDE 0.9 % IV SOLN
5.0000 mg/kg | Freq: Once | INTRAVENOUS | Status: AC
  Administered 2022-05-16: 600 mg via INTRAVENOUS
  Filled 2022-05-16: qty 16

## 2022-05-16 MED ORDER — ATROPINE SULFATE 1 MG/ML IV SOLN
0.5000 mg | Freq: Once | INTRAVENOUS | Status: AC | PRN
  Administered 2022-05-16: 0.5 mg via INTRAVENOUS
  Filled 2022-05-16: qty 1

## 2022-05-16 MED ORDER — IRINOTECAN HCL CHEMO INJECTION 100 MG/5ML
180.0000 mg/m2 | Freq: Once | INTRAVENOUS | Status: AC
  Administered 2022-05-16: 460 mg via INTRAVENOUS
  Filled 2022-05-16: qty 15

## 2022-05-16 MED ORDER — FLUOROURACIL CHEMO INJECTION 2.5 GM/50ML
398.0000 mg/m2 | Freq: Once | INTRAVENOUS | Status: AC
  Administered 2022-05-16: 1000 mg via INTRAVENOUS
  Filled 2022-05-16: qty 20

## 2022-05-16 NOTE — Patient Instructions (Signed)
[facility]  Discharge Instructions: Thank you for choosing [facility] to provide your oncology and hematology care.  If you have a lab appointment with the [facility], please go directly to the [facility] and check in at the registration area.   Wear comfortable clothing and clothing appropriate for easy access to any Portacath or PICC line.   We strive to give you quality time with your provider. You may need to reschedule your appointment if you arrive late (15 or more minutes).  Arriving late affects you and other patients whose appointments are after yours.  Also, if you miss three or more appointments without notifying the office, you may be dismissed from the clinic at the provider's discretion.      For prescription refill requests, have your pharmacy contact our office and allow 72 hours for refills to be completed.    Today you received the following chemotherapy and/or immunotherapy agents flouruoracil, bevacizumab, irinotecan      To help prevent nausea and vomiting after your treatment, we encourage you to take your nausea medication as directed.  BELOW ARE SYMPTOMS THAT SHOULD BE REPORTED IMMEDIATELY: *FEVER GREATER THAN 100.4 F (38 C) OR HIGHER *CHILLS OR SWEATING *NAUSEA AND VOMITING THAT IS NOT CONTROLLED WITH YOUR NAUSEA MEDICATION *UNUSUAL SHORTNESS OF BREATH *UNUSUAL BRUISING OR BLEEDING *URINARY PROBLEMS (pain or burning when urinating, or frequent urination) *BOWEL PROBLEMS (unusual diarrhea, constipation, pain near the anus) TENDERNESS IN MOUTH AND THROAT WITH OR WITHOUT PRESENCE OF ULCERS (sore throat, sores in mouth, or a toothache) UNUSUAL RASH, SWELLING OR PAIN  UNUSUAL VAGINAL DISCHARGE OR ITCHING   Items with * indicate a potential emergency and should be followed up as soon as possible or go to the Emergency Department if any problems should occur.  Please show the CHEMOTHERAPY ALERT CARD or IMMUNOTHERAPY ALERT  CARD at check-in to the Emergency Department and triage nurse.  Should you have questions after your visit or need to cancel or reschedule your appointment, please contact [facility]  Dept: [phone]  and follow the prompts.  Office hours are 8:00 a.m. to 4:30 p.m. Monday - Friday. Please note that voicemails left after 4:00 p.m. may not be returned until the following business day.  We are closed weekends and major holidays. You have access to a nurse at all times for urgent questions. Please call the main number to the clinic Dept: [phone] and follow the prompts.  For any non-urgent questions, you may also contact your provider using MyChart. We now offer e-Visits for anyone 19 and older to request care online for non-urgent symptoms. For details visit [email or web address].   Also download the MyChart app! Go to the app store, search "MyChart", open the app, select [facility], and log in with your MyChart username and password.

## 2022-05-18 ENCOUNTER — Inpatient Hospital Stay: Payer: BC Managed Care – PPO

## 2022-05-18 VITALS — BP 134/98 | HR 91 | Temp 97.8°F | Resp 18 | Ht 78.0 in | Wt 274.8 lb

## 2022-05-18 DIAGNOSIS — Z5112 Encounter for antineoplastic immunotherapy: Secondary | ICD-10-CM | POA: Diagnosis not present

## 2022-05-18 DIAGNOSIS — C787 Secondary malignant neoplasm of liver and intrahepatic bile duct: Secondary | ICD-10-CM

## 2022-05-18 DIAGNOSIS — C187 Malignant neoplasm of sigmoid colon: Secondary | ICD-10-CM

## 2022-05-18 MED ORDER — HEPARIN SOD (PORK) LOCK FLUSH 100 UNIT/ML IV SOLN
500.0000 [IU] | Freq: Once | INTRAVENOUS | Status: AC | PRN
  Administered 2022-05-18: 500 [IU]

## 2022-05-18 MED ORDER — SODIUM CHLORIDE 0.9% FLUSH
10.0000 mL | INTRAVENOUS | Status: DC | PRN
  Administered 2022-05-18: 10 mL

## 2022-05-18 NOTE — Patient Instructions (Signed)
Fluorouracil Injection What is this medication? FLUOROURACIL (flure oh YOOR a sil) treats some types of cancer. It works by slowing down the growth of cancer cells. This medicine may be used for other purposes; ask your health care provider or pharmacist if you have questions. COMMON BRAND NAME(S): Adrucil What should I tell my care team before I take this medication? They need to know if you have any of these conditions: Blood disorders Dihydropyrimidine dehydrogenase (DPD) deficiency Infection, such as chickenpox, cold sores, herpes Kidney disease Liver disease Poor nutrition Recent or ongoing radiation therapy An unusual or allergic reaction to fluorouracil, other medications, foods, dyes, or preservatives If you or your partner are pregnant or trying to get pregnant Breast-feeding How should I use this medication? This medication is injected into a vein. It is administered by your care team in a hospital or clinic setting. Talk to your care team about the use of this medication in children. Special care may be needed. Overdosage: If you think you have taken too much of this medicine contact a poison control center or emergency room at once. NOTE: This medicine is only for you. Do not share this medicine with others. What if I miss a dose? Keep appointments for follow-up doses. It is important not to miss your dose. Call your care team if you are unable to keep an appointment. What may interact with this medication? Do not take this medication with any of the following: Live virus vaccines This medication may also interact with the following: Medications that treat or prevent blood clots, such as warfarin, enoxaparin, dalteparin This list may not describe all possible interactions. Give your health care provider a list of all the medicines, herbs, non-prescription drugs, or dietary supplements you use. Also tell them if you smoke, drink alcohol, or use illegal drugs. Some items may  interact with your medicine. What should I watch for while using this medication? Your condition will be monitored carefully while you are receiving this medication. This medication may make you feel generally unwell. This is not uncommon as chemotherapy can affect healthy cells as well as cancer cells. Report any side effects. Continue your course of treatment even though you feel ill unless your care team tells you to stop. In some cases, you may be given additional medications to help with side effects. Follow all directions for their use. This medication may increase your risk of getting an infection. Call your care team for advice if you get a fever, chills, sore throat, or other symptoms of a cold or flu. Do not treat yourself. Try to avoid being around people who are sick. This medication may increase your risk to bruise or bleed. Call your care team if you notice any unusual bleeding. Be careful brushing or flossing your teeth or using a toothpick because you may get an infection or bleed more easily. If you have any dental work done, tell your dentist you are receiving this medication. Avoid taking medications that contain aspirin, acetaminophen, ibuprofen, naproxen, or ketoprofen unless instructed by your care team. These medications may hide a fever. Do not treat diarrhea with over the counter products. Contact your care team if you have diarrhea that lasts more than 2 days or if it is severe and watery. This medication can make you more sensitive to the sun. Keep out of the sun. If you cannot avoid being in the sun, wear protective clothing and sunscreen. Do not use sun lamps, tanning beds, or tanning booths. Talk to   your care team if you or your partner wish to become pregnant or think you might be pregnant. This medication can cause serious birth defects if taken during pregnancy and for 3 months after the last dose. A reliable form of contraception is recommended while taking this  medication and for 3 months after the last dose. Talk to your care team about effective forms of contraception. Do not father a child while taking this medication and for 3 months after the last dose. Use a condom while having sex during this time period. Do not breastfeed while taking this medication. This medication may cause infertility. Talk to your care team if you are concerned about your fertility. What side effects may I notice from receiving this medication? Side effects that you should report to your care team as soon as possible: Allergic reactions--skin rash, itching, hives, swelling of the face, lips, tongue, or throat Heart attack--pain or tightness in the chest, shoulders, arms, or jaw, nausea, shortness of breath, cold or clammy skin, feeling faint or lightheaded Heart failure--shortness of breath, swelling of the ankles, feet, or hands, sudden weight gain, unusual weakness or fatigue Heart rhythm changes--fast or irregular heartbeat, dizziness, feeling faint or lightheaded, chest pain, trouble breathing High ammonia level--unusual weakness or fatigue, confusion, loss of appetite, nausea, vomiting, seizures Infection--fever, chills, cough, sore throat, wounds that don't heal, pain or trouble when passing urine, general feeling of discomfort or being unwell Low red blood cell level--unusual weakness or fatigue, dizziness, headache, trouble breathing Pain, tingling, or numbness in the hands or feet, muscle weakness, change in vision, confusion or trouble speaking, loss of balance or coordination, trouble walking, seizures Redness, swelling, and blistering of the skin over hands and feet Severe or prolonged diarrhea Unusual bruising or bleeding Side effects that usually do not require medical attention (report to your care team if they continue or are bothersome): Dry skin Headache Increased tears Nausea Pain, redness, or swelling with sores inside the mouth or throat Sensitivity  to light Vomiting This list may not describe all possible side effects. Call your doctor for medical advice about side effects. You may report side effects to FDA at 1-800-FDA-1088. Where should I keep my medication? This medication is given in a hospital or clinic. It will not be stored at home. NOTE: This sheet is a summary. It may not cover all possible information. If you have questions about this medicine, talk to your doctor, pharmacist, or health care provider.  2023 [employer]/Gold Standard (2021-08-23 00:00:00)  The chemotherapy medication bag should finish at 46 hours, 96 hours, or 7 days. For example, if your pump is scheduled for 46 hours and it was put on at 4:00 p.m., it should finish at 2:00 p.m. the day it is scheduled to come off regardless of your appointment time.     Estimated time to finish at 1258.   If the display on your pump reads "Low Volume" and it is beeping, take the batteries out of the pump and come to the cancer center for it to be taken off.   If the pump alarms go off prior to the pump reading "Low Volume" then call [phone] and someone can assist you.  If the plunger comes out and the chemotherapy medication is leaking out, please use your home chemo spill kit to clean up the spill. Do NOT use paper towels or other household products.  If you have problems or questions regarding your pump, please call either [phone] (24 hours a day)  or the cancer center Monday-Friday 8:00 a.m.- 4:30 p.m. at the clinic number and we will assist you. If you are unable to get assistance, then go to the nearest Emergency Department and ask the staff to contact the IV team for assistance.

## 2022-05-24 ENCOUNTER — Other Ambulatory Visit: Payer: Self-pay

## 2022-05-24 ENCOUNTER — Encounter: Payer: Self-pay | Admitting: Oncology

## 2022-05-26 ENCOUNTER — Encounter: Payer: Self-pay | Admitting: Oncology

## 2022-05-26 ENCOUNTER — Inpatient Hospital Stay: Payer: BC Managed Care – PPO

## 2022-05-26 ENCOUNTER — Inpatient Hospital Stay: Payer: BC Managed Care – PPO | Admitting: Oncology

## 2022-05-26 VITALS — BP 134/95 | HR 89 | Temp 97.5°F | Resp 18 | Ht 78.0 in | Wt 267.8 lb

## 2022-05-26 DIAGNOSIS — C187 Malignant neoplasm of sigmoid colon: Secondary | ICD-10-CM

## 2022-05-26 DIAGNOSIS — Z5112 Encounter for antineoplastic immunotherapy: Secondary | ICD-10-CM | POA: Diagnosis not present

## 2022-05-26 DIAGNOSIS — C787 Secondary malignant neoplasm of liver and intrahepatic bile duct: Secondary | ICD-10-CM

## 2022-05-26 LAB — CBC AND DIFFERENTIAL
HCT: 41 (ref 41–53)
Hemoglobin: 13.5 (ref 13.5–17.5)
Neutrophils Absolute: 1.84
Platelets: 210 10*3/uL (ref 150–400)
WBC: 4

## 2022-05-26 LAB — HEPATIC FUNCTION PANEL
ALT: 17 U/L (ref 10–40)
AST: 30 (ref 14–40)
Alkaline Phosphatase: 58 (ref 25–125)
Bilirubin, Total: 0.7

## 2022-05-26 LAB — BASIC METABOLIC PANEL
BUN: 16 (ref 4–21)
CO2: 26 — AB (ref 13–22)
Chloride: 105 (ref 99–108)
Creatinine: 1.1 (ref 0.6–1.3)
Glucose: 112
Potassium: 4.3 mEq/L (ref 3.5–5.1)
Sodium: 138 (ref 137–147)

## 2022-05-26 LAB — COMPREHENSIVE METABOLIC PANEL
Albumin: 4.1 (ref 3.5–5.0)
Calcium: 9.3 (ref 8.7–10.7)

## 2022-05-26 LAB — CBC: RBC: 4.51 (ref 3.87–5.11)

## 2022-05-26 LAB — TOTAL PROTEIN, URINE DIPSTICK: Protein, ur: NEGATIVE mg/dL

## 2022-05-26 NOTE — Progress Notes (Signed)
[facility]  [address] [place],  [place]  [address] [phone]  Clinic Day: 05/26/22   Referring physician: Angelina Sheriff, MD  ASSESSMENT & PLAN:   Assessment & Plan: Malignant neoplasm metastatic to liver [facility]) History of liver metastasis in January 2021 treated with resection alone.  He had recurrent liver metastasis treated with a partial hepatectomy in June of 2023.  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  There was 1 negative node but margins were close.  This was attached to the diaphragm but the tissue from the diaphragm is benign.  He had a complicated postoperative course developing an cyst requiring drainage.  He then developed a right pleural effusion in July treated with a VATS procedure. He did have postoperative complications including biliary leak and had ERCP with placement of an endoscopic stent into the biliary duct.  He was readmitted on June 27 with abscess and sepsis and perihepatic fluid collection that required placement of a drain. He is receiving chemotherapy with FOLFIRI and Avastin, which he started in August.  His third cycle of chemotherapy was delayed as he was admitted with sepsis.  He completed IV antibiotics as an outpatient. CT abdomen pelvis from 01/07/2022 showed 12 mm short axis lymph node identified in the region of the porta hepatis. His MRI abdomen in early December at [facility] reveals nearly complete right hepatectomy without residual or recurrent metastatic disease, and no evidence of extrahepatic abdominal metastases.   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  He was treated with surgical resection followed by 6 months of adjuvant chemotherapy with FOLFOX. He had ascites and peritoneal nodules in 2018 with negative biopsy and negative fluid cytology.  He was taken for exploratory laparotomy with removal of  the nodule which is found to be benign.  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  We discussed postoperative chemotherapy, but ultimately decided on surveillance. He developed another liver metastasis in June of 2023, also resected.   Iron deficiency anemia due to chronic blood loss Microcytic anemia, which is stable. He denies any overt form of blood loss. Iron studies were equivocal in July. His HGB today is better at 13.5. He can stop his ferrous sulfate once he finishes this bottle.    Plan:  We will continue his FOLFIRI/bevacizumab cycle 12 on 05/30/22. Then we will stop this current therapy and plan to go to a maintenance program. He will receive his PET scan on 06/21/2022 and I will see him back on 06/23/2022 with CBC, CMP, and CEA. We will then make final decision on his treatment plan. The patient understands the plans discussed today and is in agreement with them.  He knows to contact our office if he develops concerns prior to his next appointment.   I provided 20 minutes of face-to-face time during this encounter and > 50% was spent counseling as documented under my assessment and plan.    Derwood Kaplan, MD  [facility] AT [facility] [address] [place] [address] Dept: [phone] Dept Fax: [phone]   Orders Placed This Encounter  Procedures   CBC and differential    This external order was created through the Results Console.   CBC    This external order was created through the Results Console.  CHIEF COMPLAINT:  CC: Recurrent colon cancer  Current Treatment: FOLFIRI/bevacizumab  HISTORY OF PRESENT ILLNESS:  The patient is a 52 y.o. 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  He presented with fevers and chills in September 2017 and was found to have diverticulitis, which was treated.  CT at that time revealed a 1.7  cm lesion in the liver, which was nonspecific, as well as ectopic left kidney with a cyst.  He was then brought back for a colonoscopy, which revealed a 3.5 cm mass in the sigmoid.  Biopsy revealed high-grade dysplasia.  CT abdomen and pelvis revealed an indeterminate lesion in the liver in addition to wall thickening in the sigmoid colon.  MRI abdomen revealed the lesion in the liver to represent complex cyst versus vascular lesion, however, follow-up in 6 months was recommended.  Air contrast barium enema revealed an apple-core lesion of the sigmoid colon.  Baseline CEA was 0.6.  He underwent surgical resection in November 2017.  Pathology revealed a 4 cm, grade 2, adenocarcinoma of the sigmoid colon, as well as chronic active diverticulitis with an area of perforation, however, the tumor was not perforated.  Tumor invaded through the muscularis propria into the pericolorectal tissues.  4/21 nodes were positive for metastasis.  Margins were clear.  MMR and MSI were normal.  KRAS and BRAF mutations were negative.  He had iron deficiency treated with oral iron supplement in the form of Hemocyte daily.  Due to his age of diagnosis and family history he underwent testing for hereditary non polyposis colorectal cancer with the Myriad myRisk Hereditary Cancer Panel test.  This did not reveal any clinically significant mutation or variants of uncertain significance.  Repeat MRI abdomen in January 2018 revealed a stable lesion within the right lobe of the liver, most consistent with benign lesion.  There was new small volume ascites seen.  The patient received adjuvant chemotherapy with FOLFOX  for 12 cycles, which was completed in June.  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  He was placed on gabapentin in July, then noticed abdominal bloating, which he attributed to the gabapentin, so he discontinued that after 1 month.  He was seen for routine  follow-up in September 2018 with a repeat MRI abdomen to re-evaluate the liver lesion.  That revealed a large volume ascites with findings suspicious for peritoneal disease.  The lesion in the right lobe of the liver was stable and still assistant with a benign lesion.  The patient underwent paracentesis with removal of 4.7 L of fluid, but fluid cytology was negative for malignancy.  PET scan in early October did not reveal any hypermetabolic activity, but moderate ascites was seen.  Ultrasound-guided peritoneal biopsy was negative for malignancy, revealing adipose tissue with fibrosis, patchy inflammation and minimal atypia.  His case was discussed at tumor conference and he was referred to Dr. Noberto Retort for consideration open biopsy.  Dr. Noberto Retort performed colonoscopy in October 2018, which was negative.  Follow-up colonoscopy in 3 years was recommended.  The patient then underwent exploratory laparotomy with multiple peritoneal biopsies.  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  We therefore recommended observation for the patient.  In November 2018, he was seen in the emergency room with abdominal pain, nausea and vomiting.  Gallbladder ultrasound revealed cholelithiasis with sludge and polyps, as well  as mild gallbladder wall thickening.  Mild ascites was also seen.  CT abdomen and pelvis revealed mild diffuse gallbladder wall edema otherwise unchanged from PET-CT done in October.  He underwent cholecystectomy with findings of acute cholecystitis.  Pathology did not reveal any evidence of malignancy.    He was seen off schedule in April 2019, as he presented to Dr. Janace Aris office with abdominal pain and was found to have an elevated bilirubin and liver transaminases.  Due to the laboratory abnormalities, he underwent repeat MRI abdomen at that time, which revealed a stable lesion in  the right hepatic lobe, which was felt to be indeterminate, with a 2nd smaller indeterminate lesion in the right hepatic lobe measuring 12 mm, which was new from previous imaging.  No hepatic steatosis or ascites was seen.  CEA was normal.  Hepatitis panel and CMV were negative  we felt the laboratory abnormalities were most likely secondary to viral illness.  He was followed closely and underwent a repeat MRI abdomen in June, which remained stable. He had one again in December of 2019, which was stable.  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  MRI abdomen in January 2021 revealed the mass in the junction of segments 7 and 8 in the liver has increased in size compared to the prior exam, currently 2.9 x 2.1 x 2.5 cm.  The lesion has a branching component, some of which may be from portal vein thrombus or tumor thrombus, even this branching component appears to enhance on subtraction images.   Biopsy in February revealed adenocarcinoma with necrosis, consistent with metastasis from colon primary.  He underwent surgical resection in February with Dr. Rolla Etienne at [facility].  We discussed the option of further chemotherapy, including the risks and benefits, and he opted for surveillance.  MRI abdomen in May 2023 revealed another solitary metastasis in the liver.  Treated with partial hepatectomy in June with Dr. Cecil Cobbs.  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  There was 1 negative node, but margins were close.  This was attached to the diaphragm, but the tissue from the diaphragm was benign.  He had a complicated postoperative course developing an cyst requiring drainage.  He then developed a right pleural  effusion in July treated with a VATS procedure.  He is receiving chemotherapy with FOLFIRI and Avastin, which he started in August   Oncology History  Colon cancer [facility])  01/23/2016 Cancer Staging   Staging form: Colon and Rectum, AJCC 7th Edition - Clinical stage from 01/23/2016: Stage IIIB (T3, N2a, M0) - Signed by Derwood Kaplan, MD on 05/03/2020 Staging comments: 3.5 cm MMR, MSI normal, No mutation of KRAS or BRAF, neg.genetics Rec'd 6 months adjuvant FOLFOX chemotherapy   02/02/2016 Initial Diagnosis   Colon cancer ([place])   12/13/2021 - 12/29/2021 Chemotherapy   Patient is on Treatment Plan : COLORECTAL FOLFIRI / BEVACIZUMAB Q14D     12/13/2021 -  Chemotherapy   Patient is on Treatment Plan : COLORECTAL FOLFIRI + Bevacizumab q14d     Malignant neoplasm metastatic to liver ([place])  05/29/2019 Initial Diagnosis   Malignant neoplasm metastatic to liver ([place])   12/13/2021 - 12/29/2021 Chemotherapy   Patient is  on Treatment Plan : COLORECTAL FOLFIRI / BEVACIZUMAB Q14D     12/13/2021 -  Chemotherapy   Patient is on Treatment Plan : COLORECTAL FOLFIRI + Bevacizumab q14d     01/07/2022 Imaging   CT abdomen and pelvis:  IMPRESSION:  1. Interval right hepatectomy with surgical changes in the dome of  the left liver.  2. 12 mm short axis lymph node identified in the region of the porta  hepatis. Attention on follow-up recommended.  3. Left pelvic kidney with large simple appearing cyst measuring  12.5 cm, increased from 7.8 cm on 03/14/2017.  4. No acute findings in the abdomen or pelvis. Specifically, no  findings to explain the patient's history of abdominal pain and  fever.        INTERVAL HISTORY:  Trev is here today for repeat clinical assessment prior to his 12th cycle of FOLFIRI/bevacizumab for recurrent liver metastases. Patient states that he feels well and that his mouth is occasionally watery and salivating. He and his wife are still monitoring his BP. He will have his Day 1  Cycle 12 on 05/30/2022. I recommend he take 5FU and Avastin as maintenance therapy and we can start that after his PET scan. He will still need his chemo pump. I will recommend treating every 2 weeks initially before we switch to 3 weeks. (Maintenance chemo) I will cancel the extra appointments in February. He will have his PET scan on 06/21/2022 and I will see him back on 06/23/2022 with CBC, CMP, and CEA. We will then make final decision on his treatment plan. He denies signs of infection such as sore throat, sinus drainage, cough, or urinary symptoms.  He denies fevers or recurrent chills. He denies pain. He denies nausea, vomiting, chest pain, dyspnea or cough. His weight has decreased 10 pounds over last week . He is accompanied today with his wife.   REVIEW OF SYSTEMS:  Review of Systems  Constitutional:  Positive for fatigue. Negative for appetite change, chills, diaphoresis, fever and unexpected weight change.  HENT:   Positive for mouth sores. Negative for hearing loss, lump/mass, nosebleeds, sore throat, tinnitus, trouble swallowing and voice change.   Eyes: Negative.  Negative for eye problems and icterus.  Respiratory: Negative.  Negative for chest tightness, cough, hemoptysis, shortness of breath and wheezing.   Cardiovascular: Negative.  Negative for chest pain, leg swelling and palpitations.  Gastrointestinal: Negative.  Negative for abdominal distention, abdominal pain, blood in stool, constipation, diarrhea, nausea, rectal pain and vomiting.  Endocrine: Negative.  Negative for hot flashes.  Genitourinary: Negative.  Negative for bladder incontinence, difficulty urinating, dyspareunia, dysuria, frequency, hematuria, nocturia, pelvic pain and penile discharge.   Musculoskeletal: Negative.  Negative for arthralgias, back pain, flank pain, gait problem, myalgias, neck pain and neck stiffness.  Skin: Negative.  Negative for itching, rash and wound.  Neurological: Negative.  Negative for  dizziness, extremity weakness, gait problem, headaches, light-headedness, numbness, seizures and speech difficulty.  Hematological: Negative.  Negative for adenopathy. Does not bruise/bleed easily.  Psychiatric/Behavioral: Negative.  Negative for confusion, decreased concentration, depression, sleep disturbance and suicidal ideas. The patient is not nervous/anxious.      VITALS:  Blood pressure (!) 134/95, pulse 89, temperature (!) 97.5 F (36.4 C), temperature source Oral, resp. rate 18, height '6\' 6"'$  (1.981 m), weight 267 lb 12.8 oz (121.5 kg), SpO2 99 %.  Wt Readings from Last 3 Encounters:  05/30/22 276 lb (125.2 kg)  05/26/22 267 lb 12.8 oz (121.5 kg)  05/18/22  274 lb 12 oz (124.6 kg)    Body mass index is 30.95 kg/m.  Performance status (ECOG): 1 - Symptomatic but completely ambulatory  PHYSICAL EXAM:  Physical Exam Vitals and nursing note reviewed. Exam conducted with a chaperone present.  Constitutional:      General: He is not in acute distress.    Appearance: Normal appearance. He is normal weight. He is not ill-appearing, toxic-appearing or diaphoretic.  HENT:     Head: Normocephalic and atraumatic.     Right Ear: Tympanic membrane, ear canal and external ear normal. There is no impacted cerumen.     Left Ear: Tympanic membrane, ear canal and external ear normal. There is no impacted cerumen.     Nose: Nose normal. No congestion or rhinorrhea.     Mouth/Throat:     Mouth: Mucous membranes are moist.     Pharynx: Oropharynx is clear. No oropharyngeal exudate or posterior oropharyngeal erythema.  Eyes:     General: No scleral icterus.       Right eye: No discharge.        Left eye: No discharge.     Extraocular Movements: Extraocular movements intact.     Conjunctiva/sclera: Conjunctivae normal.     Pupils: Pupils are equal, round, and reactive to light.  Neck:     Vascular: No carotid bruit.  Cardiovascular:     Rate and Rhythm: Normal rate and regular rhythm.      Pulses: Normal pulses.     Heart sounds: Normal heart sounds. No murmur heard.    No friction rub. No gallop.  Pulmonary:     Effort: Pulmonary effort is normal. No respiratory distress.     Breath sounds: Normal breath sounds. No stridor. No wheezing, rhonchi or rales.  Chest:     Chest wall: No tenderness.  Abdominal:     General: Bowel sounds are normal. There is no distension.     Palpations: Abdomen is soft. There is no hepatomegaly, splenomegaly or mass.     Tenderness: There is no abdominal tenderness. There is no right CVA tenderness, left CVA tenderness, guarding or rebound.     Hernia: No hernia is present.  Genitourinary:    Rectum: Guaiac result negative.  Musculoskeletal:        General: No swelling, tenderness, deformity or signs of injury. Normal range of motion.     Cervical back: Normal range of motion and neck supple. No rigidity or tenderness.     Right lower leg: No edema.     Left lower leg: No edema.  Lymphadenopathy:     Cervical: No cervical adenopathy.     Upper Body:     Right upper body: No supraclavicular or axillary adenopathy.     Left upper body: No supraclavicular or axillary adenopathy.     Lower Body: No right inguinal adenopathy. No left inguinal adenopathy.  Skin:    General: Skin is warm and dry.     Coloration: Skin is not jaundiced or pale.     Findings: No bruising, erythema, lesion or rash.  Neurological:     General: No focal deficit present.     Mental Status: He is alert and oriented to person, place, and time. Mental status is at baseline.     Cranial Nerves: No cranial nerve deficit.     Sensory: No sensory deficit.     Motor: No weakness.     Coordination: Coordination normal.     Gait: Gait normal.  Deep Tendon Reflexes: Reflexes normal.  Psychiatric:        Mood and Affect: Mood normal.        Behavior: Behavior normal.        Thought Content: Thought content normal.        Judgment: Judgment normal.     LABS:       Latest Ref Rng & Units 05/26/2022   12:00 AM 05/12/2022   12:00 AM 04/28/2022   12:00 AM  CBC  WBC  4.0     3.8     3.8      Hemoglobin 13.5 - 17.5 13.5     13.4     14.2      Hematocrit 41 - 53 41     39     40      Platelets 150 - 400 K/uL 210     203     234         This result is from an external source.      Latest Ref Rng & Units 05/26/2022   12:00 AM 04/28/2022   12:00 AM 04/14/2022    2:41 PM  CMP  Glucose 70 - 99 mg/dL   116   BUN 4 - '21 16     14     8   '$ Creatinine 0.6 - 1.3 1.1     1.0     1.09   Sodium 137 - 147 138     138     141   Potassium 3.5 - 5.1 mEq/L 4.3     4.5     4.3   Chloride 99 - 108 105     101     105   CO2 13 - '22 26     25     26   '$ Calcium 8.7 - 10.7 9.3     8.9     8.9   Total Protein 6.5 - 8.1 g/dL   6.0   Total Bilirubin 0.3 - 1.2 mg/dL   0.6   Alkaline Phos 25 - 125 58     70     63   AST 14 - 40 30     33     19   ALT 10 - 40 U/L '17     18     17      '$ This result is from an external source.   Lab Results  Component Value Date   CEA1 0.8 01/06/2022   /  CEA  Date Value Ref Range Status  01/06/2022 0.8 0.0 - 4.7 ng/mL Final    Comment:    (NOTE)                             Nonsmokers          <3.9                             Smokers             <5.6 [employer] Electrochemiluminescence Immunoassay (ECLIA) Values obtained with different assay methods or kits cannot be used interchangeably.  Results cannot be interpreted as absolute evidence of the presence or absence of malignant disease. Performed At: [facility] [place], [place] [id number] Rush Farmer MD [id number]    No results found for: "PSA1" No results found for: "[phone]"  No results found for: "CAN125"  No results found for: "TOTALPROTELP", "ALBUMINELP", "A1GS", "A2GS", "BETS", "BETA2SER", "GAMS", "MSPIKE", "SPEI" Lab Results  Component Value Date   TIBC 225 (L) 11/22/2021   FERRITIN 292 11/22/2021   IRONPCTSAT 6 (L) 11/22/2021    No results found for: "LDH"  STUDIES:  CT abdomen and pelvis 01/07/2022 IMPRESSION:  1. Interval right hepatectomy with surgical changes in the dome of  the left liver.  2. 12 mm short axis lymph node identified in the region of the porta  hepatis. Attention on follow-up recommended.  3. Left pelvic kidney with large simple appearing cyst measuring  12.5 cm, increased from 7.8 cm on 03/14/2017.  4. No acute findings in the abdomen or pelvis. Specifically, no  findings to explain the patient's history of abdominal pain and  fever.   NUCLEAR MEDICINE PET SKULL BASE TO THIGH 10/07/2021 TECHNIQUE: 14.0 mCi F-18 FDG was injected intravenously. Full-ring PET imaging was performed from the skull base to thigh after the radiotracer. CT data was obtained and used for attenuation correction and anatomic localization. Fasting blood glucose: 110 mg/dl COMPARISON:  MRI 09/29/2021 and PET-CT 05/30/2019. Calcified granuloma identified in the right middle lobe, image 44/7. FINDINGS: Mediastinal blood pool activity: SUV max 2.86 Liver activity: SUV max NA NECK: No hypermetabolic lymph nodes in the neck. Incidental CT findings: none CHEST: No hypermetabolic mediastinal or hilar nodes. No suspicious pulmonary nodules on the CT scan. Incidental CT findings: none ABDOMEN/PELVIS: Tracer avid lesion within the dome of liver has an SUV max of 6.41, image 107 of the fused PET-CT images. This is not visible on the corresponding unenhanced CT images. On the PET-CT from 05/30/2019 this had an SUV max of 4.3. On the recent MRI of the abdomen this measured 2.2 x 2.5 x 2.7 cm. No additional abnormal foci of increased radiotracer uptake within the liver. No abnormal uptake identified within the pancreas, spleen or adrenal glands. No tracer avid right abdominopelvic lymph nodes. Anastomotic suture line is identified at the rectosigmoid junction. No signs of locally recurrent tumor. Incidental CT findings:  Left pelvic kidney with large Bosniak class 1 exophytic cyst is again noted. No follow-up recommended. SKELETON: No focal hypermetabolic activity to suggest skeletal metastasis. Incidental CT findings: none IMPRESSION: 1. Increased radiotracer uptake is identified within the dome of liver corresponding to the enhancing liver lesion on recent MRI of the abdomen. 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 2. No additional sites of FDG avid disease.   MRI ABDOMEN WITHOUT AND WITH CONTRAST 09/29/2021 TECHNIQUE: Multiplanar multisequence MR imaging of the abdomen was performed both before and after the administration of intravenous contrast. CONTRAST:  13m GADAVIST GADOBUTROL 1 MMOL/ML IV SOLN COMPARISON:  Abdominal MRI 12/30/2020. FINDINGS: Lower chest: Unremarkable Hepatobiliary: Mild diffuse loss of signal intensity throughout the hepatic parenchyma, indicative of a background of mild hepatic steatosis. Again noted are areas of susceptibility artifact in the superior aspect of the right lobe of the liver related to prior partial hepatectomy. 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 No other definite suspicious hepatic lesions are noted. No intra or extrahepatic biliary ductal dilatation. Status post cholecystectomy. Common bile duct measures 4 mm in the porta hepatis.  Pancreas: No pancreatic mass. No pancreatic ductal dilatation. No pancreatic or peripancreatic fluid collections or inflammatory changes. Spleen:  Unremarkable. Adrenals/Urinary Tract: Left kidney not visualized (previous imaging demonstrates a left pelvic kidney). Right kidney and  bilateral adrenal glands are normal in appearance. No right hydroureteronephrosis in the visualized portions of the abdomen. Stomach/Bowel: Visualized portions are unremarkable. Vascular/Lymphatic: No aneurysm identified in the visualized abdominal vasculature. No lymphadenopathy noted in the abdomen. Other: No significant volume of ascites noted in the visualized portions of the peritoneal cavity. Musculoskeletal: No aggressive appearing osseous lesions are noted in the visualized portions of the skeleton.   IMPRESSION: 1. 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 No new sites of metastatic disease are otherwise noted.   These results will be called to the ordering clinician or representative by the Radiologist Assistant, and communication documented in the PACS or [employer].   HISTORY:   Past Medical History:  Diagnosis Date   GERD (gastroesophageal reflux disease)    Renal lithiasis     Past Surgical History:  Procedure Laterality Date   LIVER RESECTION Right 07/02/2019   LIVER RESECTION Right 10/12/2021   SIGMOIDECTOMY  03/2016    Family History  Problem Relation Age of Onset   Lung cancer Father 63   Leukemia Father 65       acute myelogenous   Colon cancer Sister 82    Social History:  reports that he has quit smoking. He has never used smokeless tobacco. He reports that he does not currently use alcohol. No history on file for drug use.The patient is accompanied by his wife today.  Allergies: No Known Allergies  Current Medications: Current Outpatient Medications  Medication Sig Dispense Refill   ascorbic acid (VITAMIN C) 1000 MG tablet Take by mouth.     benzonatate (TESSALON) 200 MG capsule Take 1 capsule (200 mg total) by mouth 3 (three) times daily as needed for cough. (Patient not taking: Reported on 03/02/2022) 20 capsule 1   Calcium Polycarbophil  (FIBER) 625 MG TABS Take by mouth.     cetirizine (ZYRTEC) 10 MG tablet Take by mouth.     Cholecalciferol (VITAMIN D) 50 MCG (2000 UT) tablet Take by mouth.     Docusate Sodium (DSS) 100 MG CAPS Take 1 capsule by mouth daily.     hydrocortisone (ANUSOL-HC) 25 MG suppository Place 1 suppository (25 mg total) rectally 2 (two) times daily. 12 suppository 1   lisinopril (ZESTRIL) 20 MG tablet Take 1 tablet (20 mg total) by mouth daily. 30 tablet 5   loperamide (IMODIUM) 2 MG capsule Take 1 capsule (2 mg total) by mouth as needed for diarrhea or loose stools. Take 2 at diarrhea onset , then 1 every 2hr until 12hrs with no BM. May take 2 every 4hrs at night. If diarrhea recurs repeat. 100 capsule 5   LORazepam (ATIVAN) 1 MG tablet Take 1 tablet (1 mg total) by mouth every 6 (six) hours as needed for anxiety or sleep. 100 tablet 0   magic mouthwash (nystatin, lidocaine, diphenhydrAMINE, alum & mag hydroxide) suspension SWISH AND/OR SWALLOW 5 ML BY MOUTH EVERY THREE HOURS IF NEEDED     Misc Natural Products (ELDERBERRY IMMUNE COMPLEX) CHEW Chew 1 Dose by mouth daily.     Multiple Vitamin (MULTI-VITAMIN) tablet Take 1 tablet by mouth daily.     omeprazole (PRILOSEC) 20 MG capsule Take by mouth.  ondansetron (ZOFRAN) 8 MG tablet Take 1 tablet (8 mg total) by mouth 2 (two) times daily as needed for refractory nausea / vomiting. Start on day 3 after chemotherapy. 30 tablet 1   ondansetron (ZOFRAN-ODT) 4 MG disintegrating tablet Take 4 mg by mouth every 8 (eight) hours as needed. (Patient not taking: Reported on 03/02/2022)     oxyCODONE (OXY IR/ROXICODONE) 5 MG immediate release tablet Take by mouth.     Probiotic Product (PROBIOTIC BLEND PO) Take 1 tablet by mouth daily.     prochlorperazine (COMPAZINE) 10 MG tablet Take 1 tablet (10 mg total) by mouth every 6 (six) hours as needed (NAUSEA). (Patient not taking: Reported on 03/02/2022) 30 tablet 1   vitamin B-12 (CYANOCOBALAMIN) 250 MCG tablet Take by  mouth.     zinc gluconate 50 MG tablet Take 50 mg by mouth daily.     No current facility-administered medications for this visit.   Facility-Administered Medications Ordered in Other Visits  Medication Dose Route Frequency Provider Last Rate Last Admin   atropine 1 MG/ML injection            palonosetron (ALOXI) 0.25 MG/5ML injection             I,Jasmine M Lassiter,acting as a scribe for Derwood Kaplan, MD.,have documented all relevant documentation on the behalf of Derwood Kaplan, MD,as directed by  Derwood Kaplan, MD while in the presence of Derwood Kaplan, MD.

## 2022-05-29 ENCOUNTER — Other Ambulatory Visit: Payer: Self-pay | Admitting: Pharmacist

## 2022-05-30 ENCOUNTER — Inpatient Hospital Stay: Payer: BC Managed Care – PPO

## 2022-05-30 VITALS — BP 132/82 | HR 91 | Temp 97.5°F | Resp 20 | Ht 78.0 in | Wt 276.0 lb

## 2022-05-30 DIAGNOSIS — C187 Malignant neoplasm of sigmoid colon: Secondary | ICD-10-CM

## 2022-05-30 DIAGNOSIS — C787 Secondary malignant neoplasm of liver and intrahepatic bile duct: Secondary | ICD-10-CM

## 2022-05-30 DIAGNOSIS — Z5112 Encounter for antineoplastic immunotherapy: Secondary | ICD-10-CM | POA: Diagnosis not present

## 2022-05-30 MED ORDER — LEUCOVORIN CALCIUM INJECTION 350 MG
389.0000 mg/m2 | Freq: Once | INTRAVENOUS | Status: AC
  Administered 2022-05-30: 1000 mg via INTRAVENOUS
  Filled 2022-05-30: qty 50

## 2022-05-30 MED ORDER — SODIUM CHLORIDE 0.9 % IV SOLN
2375.0000 mg/m2 | INTRAVENOUS | Status: DC
  Administered 2022-05-30: 6100 mg via INTRAVENOUS
  Filled 2022-05-30: qty 122

## 2022-05-30 MED ORDER — PALONOSETRON HCL INJECTION 0.25 MG/5ML
0.2500 mg | Freq: Once | INTRAVENOUS | Status: AC
  Administered 2022-05-30: 0.25 mg via INTRAVENOUS
  Filled 2022-05-30: qty 5

## 2022-05-30 MED ORDER — SODIUM CHLORIDE 0.9 % IV SOLN: Freq: Once | INTRAVENOUS | Status: AC

## 2022-05-30 MED ORDER — SODIUM CHLORIDE 0.9 % IV SOLN
10.0000 mg | Freq: Once | INTRAVENOUS | Status: AC
  Administered 2022-05-30: 10 mg via INTRAVENOUS
  Filled 2022-05-30: qty 10

## 2022-05-30 MED ORDER — ATROPINE SULFATE 1 MG/ML IV SOLN
0.5000 mg | Freq: Once | INTRAVENOUS | Status: AC | PRN
  Administered 2022-05-30: 0.5 mg via INTRAVENOUS
  Filled 2022-05-30: qty 1

## 2022-05-30 MED ORDER — SODIUM CHLORIDE 0.9% FLUSH: 10.0000 mL | INTRAVENOUS | Status: DC | PRN

## 2022-05-30 MED ORDER — HEPARIN SOD (PORK) LOCK FLUSH 100 UNIT/ML IV SOLN: 500.0000 [IU] | Freq: Once | INTRAVENOUS | Status: DC | PRN

## 2022-05-30 MED ORDER — IRINOTECAN HCL CHEMO INJECTION 100 MG/5ML
180.0000 mg/m2 | Freq: Once | INTRAVENOUS | Status: AC
  Administered 2022-05-30: 460 mg via INTRAVENOUS
  Filled 2022-05-30: qty 15

## 2022-05-30 MED ORDER — SODIUM CHLORIDE 0.9 % IV SOLN
5.0000 mg/kg | Freq: Once | INTRAVENOUS | Status: AC
  Administered 2022-05-30: 600 mg via INTRAVENOUS
  Filled 2022-05-30: qty 16

## 2022-05-30 MED ORDER — FLUOROURACIL CHEMO INJECTION 2.5 GM/50ML
398.0000 mg/m2 | Freq: Once | INTRAVENOUS | Status: AC
  Administered 2022-05-30: 1000 mg via INTRAVENOUS
  Filled 2022-05-30: qty 20

## 2022-05-30 NOTE — Patient Instructions (Signed)
The chemotherapy medication bag should finish at 46 hours. For example, if your pump is scheduled for 46 hours and it was put on at 4:00 p.m., it should finish at 2:00 p.m. the day it is scheduled to come off regardless of your appointment time.     Estimated time to finish at noon..   If the display on your pump reads "Low Volume" and it is beeping, take the batteries out of the pump and come to the cancer center for it to be taken off.   If the pump alarms go off prior to the pump reading "Low Volume" then call [phone] and someone can assist you.  If the plunger comes out and the chemotherapy medication is leaking out, please use your home chemo spill kit to clean up the spill. Do NOT use paper towels or other household products.  If you have problems or questions regarding your pump, please call either [phone] (24 hours a day) or the cancer center Monday-Friday 8:00 a.m.- 4:30 p.m. at the clinic number and we will assist you. If you are unable to get assistance, then go to the nearest Emergency Department and ask the staff to contact the IV team for assistance.   Fluorouracil Injection What is this medication? FLUOROURACIL (flure oh YOOR a sil) treats some types of cancer. It works by slowing down the growth of cancer cells. This medicine may be used for other purposes; ask your health care provider or pharmacist if you have questions. COMMON BRAND NAME(S): Adrucil What should I tell my care team before I take this medication? They need to know if you have any of these conditions: Blood disorders Dihydropyrimidine dehydrogenase (DPD) deficiency Infection, such as chickenpox, cold sores, herpes Kidney disease Liver disease Poor nutrition Recent or ongoing radiation therapy An unusual or allergic reaction to fluorouracil, other medications, foods, dyes, or preservatives If you or your partner are pregnant or trying to get pregnant Breast-feeding How should I use this  medication? This medication is injected into a vein. It is administered by your care team in a hospital or clinic setting. Talk to your care team about the use of this medication in children. Special care may be needed. Overdosage: If you think you have taken too much of this medicine contact a poison control center or emergency room at once. NOTE: This medicine is only for you. Do not share this medicine with others. What if I miss a dose? Keep appointments for follow-up doses. It is important not to miss your dose. Call your care team if you are unable to keep an appointment. What may interact with this medication? Do not take this medication with any of the following: Live virus vaccines This medication may also interact with the following: Medications that treat or prevent blood clots, such as warfarin, enoxaparin, dalteparin This list may not describe all possible interactions. Give your health care provider a list of all the medicines, herbs, non-prescription drugs, or dietary supplements you use. Also tell them if you smoke, drink alcohol, or use illegal drugs. Some items may interact with your medicine. What should I watch for while using this medication? Your condition will be monitored carefully while you are receiving this medication. This medication may make you feel generally unwell. This is not uncommon as chemotherapy can affect healthy cells as well as cancer cells. Report any side effects. Continue your course of treatment even though you feel ill unless your care team tells you to stop. In some cases, you  may be given additional medications to help with side effects. Follow all directions for their use. This medication may increase your risk of getting an infection. Call your care team for advice if you get a fever, chills, sore throat, or other symptoms of a cold or flu. Do not treat yourself. Try to avoid being around people who are sick. This medication may increase your risk  to bruise or bleed. Call your care team if you notice any unusual bleeding. Be careful brushing or flossing your teeth or using a toothpick because you may get an infection or bleed more easily. If you have any dental work done, tell your dentist you are receiving this medication. Avoid taking medications that contain aspirin, acetaminophen, ibuprofen, naproxen, or ketoprofen unless instructed by your care team. These medications may hide a fever. Do not treat diarrhea with over the counter products. Contact your care team if you have diarrhea that lasts more than 2 days or if it is severe and watery. This medication can make you more sensitive to the sun. Keep out of the sun. If you cannot avoid being in the sun, wear protective clothing and sunscreen. Do not use sun lamps, tanning beds, or tanning booths. Talk to your care team if you or your partner wish to become pregnant or think you might be pregnant. This medication can cause serious birth defects if taken during pregnancy and for 3 months after the last dose. A reliable form of contraception is recommended while taking this medication and for 3 months after the last dose. Talk to your care team about effective forms of contraception. Do not father a child while taking this medication and for 3 months after the last dose. Use a condom while having sex during this time period. Do not breastfeed while taking this medication. This medication may cause infertility. Talk to your care team if you are concerned about your fertility. What side effects may I notice from receiving this medication? Side effects that you should report to your care team as soon as possible: Allergic reactions--skin rash, itching, hives, swelling of the face, lips, tongue, or throat Heart attack--pain or tightness in the chest, shoulders, arms, or jaw, nausea, shortness of breath, cold or clammy skin, feeling faint or lightheaded Heart failure--shortness of breath, swelling of  the ankles, feet, or hands, sudden weight gain, unusual weakness or fatigue Heart rhythm changes--fast or irregular heartbeat, dizziness, feeling faint or lightheaded, chest pain, trouble breathing High ammonia level--unusual weakness or fatigue, confusion, loss of appetite, nausea, vomiting, seizures Infection--fever, chills, cough, sore throat, wounds that don't heal, pain or trouble when passing urine, general feeling of discomfort or being unwell Low red blood cell level--unusual weakness or fatigue, dizziness, headache, trouble breathing Pain, tingling, or numbness in the hands or feet, muscle weakness, change in vision, confusion or trouble speaking, loss of balance or coordination, trouble walking, seizures Redness, swelling, and blistering of the skin over hands and feet Severe or prolonged diarrhea Unusual bruising or bleeding Side effects that usually do not require medical attention (report to your care team if they continue or are bothersome): Dry skin Headache Increased tears Nausea Pain, redness, or swelling with sores inside the mouth or throat Sensitivity to light Vomiting This list may not describe all possible side effects. Call your doctor for medical advice about side effects. You may report side effects to FDA at 1-800-FDA-1088. Where should I keep my medication? This medication is given in a hospital or clinic. It will not  be stored at home. NOTE: This sheet is a summary. It may not cover all possible information. If you have questions about this medicine, talk to your doctor, pharmacist, or health care provider.  2023 Else[employer]/Gold Standard (2021-08-23 00:00:00)  Leucovorin Injection What is this medication? LEUCOVORIN (loo koe VOR in) prevents side effects from certain medications, such as methotrexate. It works by increasing folate levels. This helps protect healthy cells in your body. It may also be used to treat anemia caused by low levels of folate. It can also  be used with fluorouracil, a type of chemotherapy, to treat colorectal cancer. It works by increasing the effects of fluorouracil in the body. This medicine may be used for other purposes; ask your health care provider or pharmacist if you have questions. What should I tell my care team before I take this medication? They need to know if you have any of these conditions: Anemia from low levels of vitamin B12 in the blood An unusual or allergic reaction to leucovorin, folic acid, other medications, foods, dyes, or preservatives Pregnant or trying to get pregnant Breastfeeding How should I use this medication? This medication is injected into a vein or a muscle. It is given by your care team in a hospital or clinic setting. Talk to your care team about the use of this medication in children. Special care may be needed. Overdosage: If you think you have taken too much of this medicine contact a poison control center or emergency room at once. NOTE: This medicine is only for you. Do not share this medicine with others. What if I miss a dose? Keep appointments for follow-up doses. It is important not to miss your dose. Call your care team if you are unable to keep an appointment. What may interact with this medication? Capecitabine Fluorouracil Phenobarbital Phenytoin Primidone Trimethoprim;sulfamethoxazole This list may not describe all possible interactions. Give your health care provider a list of all the medicines, herbs, non-prescription drugs, or dietary supplements you use. Also tell them if you smoke, drink alcohol, or use illegal drugs. Some items may interact with your medicine. What should I watch for while using this medication? Your condition will be monitored carefully while you are receiving this medication. This medication may increase the side effects of 5-fluorouracil. Tell your care team if you have diarrhea or mouth sores that do not get better or that get worse. What side  effects may I notice from receiving this medication? Side effects that you should report to your care team as soon as possible: Allergic reactions--skin rash, itching, hives, swelling of the face, lips, tongue, or throat This list may not describe all possible side effects. Call your doctor for medical advice about side effects. You may report side effects to FDA at 1-800-FDA-1088. Where should I keep my medication? This medication is given in a hospital or clinic. It will not be stored at home. NOTE: This sheet is a summary. It may not cover all possible information. If you have questions about this medicine, talk to your doctor, pharmacist, or health care provider.  2023 Elsevier/Gold Standard (2021-09-27 00:00:00)  Irinotecan Injection What is this medication? IRINOTECAN (ir in oh TEE kan) treats some types of cancer. It works by slowing down the growth of cancer cells. This medicine may be used for other purposes; ask your health care provider or pharmacist if you have questions. COMMON BRAND NAME(S): Camptosar What should I tell my care team before I take this medication? They need to know  if you have any of these conditions: Dehydration Diarrhea Infection, especially a viral infection, such as chickenpox, cold sores, herpes Liver disease Low blood cell levels (white cells, red cells, and platelets) Low levels of electrolytes, such as calcium, magnesium, or potassium in your blood Recent or ongoing radiation An unusual or allergic reaction to irinotecan, other medications, foods, dyes, or preservatives If you or your partner are pregnant or trying to get pregnant Breast-feeding How should I use this medication? This medication is injected into a vein. It is given by your care team in a hospital or clinic setting. Talk to your care team about the use of this medication in children. Special care may be needed. Overdosage: If you think you have taken too much of this medicine contact  a poison control center or emergency room at once. NOTE: This medicine is only for you. Do not share this medicine with others. What if I miss a dose? Keep appointments for follow-up doses. It is important not to miss your dose. Call your care team if you are unable to keep an appointment. What may interact with this medication? Do not take this medication with any of the following: Cobicistat Itraconazole This medication may also interact with the following: Certain antibiotics, such as clarithromycin, rifampin, rifabutin Certain antivirals for HIV or AIDS Certain medications for fungal infections, such as ketoconazole, posaconazole, voriconazole Certain medications for seizures, such as carbamazepine, phenobarbital, phenytoin Gemfibrozil Nefazodone St. John's wort This list may not describe all possible interactions. Give your health care provider a list of all the medicines, herbs, non-prescription drugs, or dietary supplements you use. Also tell them if you smoke, drink alcohol, or use illegal drugs. Some items may interact with your medicine. What should I watch for while using this medication? Your condition will be monitored carefully while you are receiving this medication. You may need blood work while taking this medication. This medication may make you feel generally unwell. This is not uncommon as chemotherapy can affect healthy cells as well as cancer cells. Report any side effects. Continue your course of treatment even though you feel ill unless your care team tells you to stop. This medication can cause serious side effects. To reduce the risk, your care team may give you other medications to take before receiving this one. Be sure to follow the directions from your care team. This medication may affect your coordination, reaction time, or judgement. Do not drive or operate machinery until you know how this medication affects you. Sit up or stand slowly to reduce the risk of  dizzy or fainting spells. Drinking alcohol with this medication can increase the risk of these side effects. This medication may increase your risk of getting an infection. Call your care team for advice if you get a fever, chills, sore throat, or other symptoms of a cold or flu. Do not treat yourself. Try to avoid being around people who are sick. Avoid taking medications that contain aspirin, acetaminophen, ibuprofen, naproxen, or ketoprofen unless instructed by your care team. These medications may hide a fever. This medication may increase your risk to bruise or bleed. Call your care team if you notice any unusual bleeding. Be careful brushing or flossing your teeth or using a toothpick because you may get an infection or bleed more easily. If you have any dental work done, tell your dentist you are receiving this medication. Talk to your care team if you or your partner are pregnant or think either of you might  be pregnant. This medication can cause serious birth defects if taken during pregnancy and for 6 months after the last dose. You will need a negative pregnancy test before starting this medication. Contraception is recommended while taking this medication and for 6 months after the last dose. Your care team can help you find the option that works for you. Do not father a child while taking this medication and for 3 months after the last dose. Use a condom for contraception during this time period. Do not breastfeed while taking this medication and for 7 days after the last dose. This medication may cause infertility. Talk to your care team if you are concerned about your fertility. What side effects may I notice from receiving this medication? Side effects that you should report to your care team as soon as possible: Allergic reactions--skin rash, itching, hives, swelling of the face, lips, tongue, or throat Dry cough, shortness of breath or trouble breathing Increased saliva or tears,  increased sweating, stomach cramping, diarrhea, small pupils, unusual weakness or fatigue, slow heartbeat Infection--fever, chills, cough, sore throat, wounds that don't heal, pain or trouble when passing urine, general feeling of discomfort or being unwell Kidney injury--decrease in the amount of urine, swelling of the ankles, hands, or feet Low red blood cell level--unusual weakness or fatigue, dizziness, headache, trouble breathing Severe or prolonged diarrhea Unusual bruising or bleeding Side effects that usually do not require medical attention (report to your care team if they continue or are bothersome): Constipation Diarrhea Hair loss Loss of appetite Nausea Stomach pain This list may not describe all possible side effects. Call your doctor for medical advice about side effects. You may report side effects to FDA at 1-800-FDA-1088. Where should I keep my medication? This medication is given in a hospital or clinic. It will not be stored at home. NOTE: This sheet is a summary. It may not cover all possible information. If you have questions about this medicine, talk to your doctor, pharmacist, or health care provider.  2023 Elsevier/Gold Standard (2021-09-01 00:00:00)  Atropine injection What is this medication? ATROPINE (A troe peen) can help treat many conditions. This medicine is used to reduce saliva and fluid in the respiratory tract during surgery. It is also used to treat insecticide or mushroom poisoning. It can be used in an emergency to treat a slow heartbeat. This medicine may be used for other purposes; ask your health care provider or pharmacist if you have questions. This medicine may be used for other purposes; ask your health care provider or pharmacist if you have questions. What should I tell my care team before I take this medication? They need to know if you have any of these conditions: closed-angle glaucoma heart disease, or previous heart attack kidney  disease prostate trouble stomach obstruction an unusual or allergic reaction to atropine, other medicines, foods, dyes, or preservatives pregnant or trying to get pregnant breast-feeding How should I use this medication? This medicine is for injection into a muscle, vein, or under the skin. It is usually given by a health care professional in a hospital or clinic setting. If you get this medicine at home, you will be taught how to prepare and give this medicine. Use exactly as directed. It should only be given by persons who have training in the signs and treatment of nerve agent or insecticide poisoning. It is important that you put your used needles and syringes in a special sharps container. Do not put them in a trash  can. If you do not have a sharps container, call your pharmacist or healthcare provider to get one. Talk to your pediatrician regarding the use of this medicine in children. Special care may be needed. Overdosage: If you think you have taken too much of this medicine contact a poison control center or emergency room at once. NOTE: This medicine is only for you. Do not share this medicine with others. Overdosage: If you think you have taken too much of this medicine contact a poison control center or emergency room at once. NOTE: This medicine is only for you. Do not share this medicine with others. What if I miss a dose? This does not apply. What may interact with this medication? atomoxetine barbiturates, like phenobarbital benztropine donepezil ephedra galantamine glutethimide medicines for mental problems and psychotic disturbances medicines for Parkinson's disease pralidoxime rivastigmine some medicines for congestion, cold, or allergy stimulant medicines for attention disorders, weight loss, or to stay awake tacrine tegaserod This list may not describe all possible interactions. Give your health care provider a list of all the medicines, herbs, non-prescription  drugs, or dietary supplements you use. Also tell them if you smoke, drink alcohol, or use illegal drugs. Some items may interact with your medicine. This list may not describe all possible interactions. Give your health care provider a list of all the medicines, herbs, non-prescription drugs, or dietary supplements you use. Also tell them if you smoke, drink alcohol, or use illegal drugs. Some items may interact with your medicine. What should I watch for while using this medication? Side effects may occur even though you are no longer using this medicine. Contact your doctor or health care professional if you are still experiencing side effects after several days. You may get drowsy or dizzy. Do not drive, use machinery, or do anything that needs mental alertness until you know how this medicine affects you. Do not stand or sit up quickly, especially if you are an older patient. This reduces the risk of dizzy or fainting spells. Alcohol may interfere with the effect of this medicine. Avoid alcoholic drinks. Your mouth may get dry. Chewing sugarless gum or sucking hard candy, and drinking plenty of water may help. Contact your doctor if the problem does not go away or is severe. This medicine may cause dry eyes and blurred vision. If you wear contact lenses you may feel some discomfort. Lubricating drops may help. See your eye doctor if the problem does not go away or is severe. Avoid extreme heat. This medicine can cause you to sweat less than normal. Your body temperature could increase to dangerous levels, which may lead to heat stroke. What side effects may I notice from receiving this medication? Side effects that you should report to your doctor or health care professional as soon as possible: allergic reactions like skin rash, itching or hives, swelling of the face, lips, or tongue anxiety, nervousness changes in vision confusion fast or slow heartbeat feeling faint or lightheaded,  falls hallucinations memory loss redness, blistering, peeling or loosening of the skin, including inside the mouth slurred speech trouble passing urine or change in the amount of urine unusually weak or tired vomiting Side effects that usually do not require medical attention (report to your doctor or health care professional if they continue or are bothersome): constipation dry mouth flushing or redness of face or skin within 15 to 20 minutes after the injection nausea This list may not describe all possible side effects. Call your doctor for medical  advice about side effects. You may report side effects to FDA at 1-800-FDA-1088. This list may not describe all possible side effects. Call your doctor for medical advice about side effects. You may report side effects to FDA at 1-800-FDA-1088. Where should I keep my medication? Keep out of the reach of children. If you are using this medicine at home, you will be instructed on how to store this medicine. Throw away any unused medicine after the expiration date on the label. NOTE: This sheet is a summary. It may not cover all possible information. If you have questions about this medicine, talk to your doctor, pharmacist, or health care provider.  2023 Elsevier/Gold Standard (2006-01-02 00:00:00) Bevacizumab Injection What is this medication? BEVACIZUMAB (be va SIZ yoo mab) treats some types of cancer. It works by blocking a protein that causes cancer cells to grow and multiply. This helps to slow or stop the spread of cancer cells. It is a monoclonal antibody. This medicine may be used for other purposes; ask your health care provider or pharmacist if you have questions. COMMON BRAND NAME(S): Alymsys, Avastin, MVASI, Noah Charon What should I tell my care team before I take this medication? They need to know if you have any of these conditions: Blood clots Coughing up blood Having or recent surgery Heart failure High blood pressure History  of a connection between 2 or more body parts that do not usually connect (fistula) History of a tear in your stomach or intestines Protein in your urine An unusual or allergic reaction to bevacizumab, other medications, foods, dyes, or preservatives Pregnant or trying to get pregnant Breast-feeding How should I use this medication? This medication is injected into a vein. It is given by your care team in a hospital or clinic setting. Talk to your care team the use of this medication in children. Special care may be needed. Overdosage: If you think you have taken too much of this medicine contact a poison control center or emergency room at once. NOTE: This medicine is only for you. Do not share this medicine with others. What if I miss a dose? Keep appointments for follow-up doses. It is important not to miss your dose. Call your care team if you are unable to keep an appointment. What may interact with this medication? Interactions are not expected. This list may not describe all possible interactions. Give your health care provider a list of all the medicines, herbs, non-prescription drugs, or dietary supplements you use. Also tell them if you smoke, drink alcohol, or use illegal drugs. Some items may interact with your medicine. What should I watch for while using this medication? Your condition will be monitored carefully while you are receiving this medication. You may need blood work while taking this medication. This medication may make you feel generally unwell. This is not uncommon as chemotherapy can affect healthy cells as well as cancer cells. Report any side effects. Continue your course of treatment even though you feel ill unless your care team tells you to stop. This medication may increase your risk to bruise or bleed. Call your care team if you notice any unusual bleeding. Before having surgery, talk to your care team to make sure it is ok. This medication can increase the risk  of poor healing of your surgical site or wound. You will need to stop this medication for 28 days before surgery. After surgery, wait at least 28 days before restarting this medication. Make sure the surgical site or wound is  healed enough before restarting this medication. Talk to your care team if questions. Talk to your care team if you may be pregnant. Serious birth defects can occur if you take this medication during pregnancy and for 6 months after the last dose. Contraception is recommended while taking this medication and for 6 months after the last dose. Your care team can help you find the option that works for you. Do not breastfeed while taking this medication and for 6 months after the last dose. This medication can cause infertility. Talk to your care team if you are concerned about your fertility. What side effects may I notice from receiving this medication? Side effects that you should report to your care team as soon as possible: Allergic reactions--skin rash, itching, hives, swelling of the face, lips, tongue, or throat Bleeding--bloody or black, tar-like stools, vomiting blood or brown material that looks like coffee grounds, red or dark brown urine, small red or purple spots on skin, unusual bruising or bleeding Blood clot--pain, swelling, or warmth in the leg, shortness of breath, chest pain Heart attack--pain or tightness in the chest, shoulders, arms, or jaw, nausea, shortness of breath, cold or clammy skin, feeling faint or lightheaded Heart failure--shortness of breath, swelling of the ankles, feet, or hands, sudden weight gain, unusual weakness or fatigue Increase in blood pressure Infection--fever, chills, cough, sore throat, wounds that don't heal, pain or trouble when passing urine, general feeling of discomfort or being unwell Infusion reactions--chest pain, shortness of breath or trouble breathing, feeling faint or lightheaded Kidney injury--decrease in the amount of  urine, swelling of the ankles, hands, or feet Stomach pain that is severe, does not go away, or gets worse Stroke--sudden numbness or weakness of the face, arm, or leg, trouble speaking, confusion, trouble walking, loss of balance or coordination, dizziness, severe headache, change in vision Sudden and severe headache, confusion, change in vision, seizures, which may be signs of posterior reversible encephalopathy syndrome (PRES) Side effects that usually do not require medical attention (report to your care team if they continue or are bothersome): Back pain Change in taste Diarrhea Dry skin Increased tears Nosebleed This list may not describe all possible side effects. Call your doctor for medical advice about side effects. You may report side effects to FDA at 1-800-FDA-1088. Where should I keep my medication? This medication is given in a hospital or clinic. It will not be stored at home. NOTE: This sheet is a summary. It may not cover all possible information. If you have questions about this medicine, talk to your doctor, pharmacist, or health care provider.  2023 [employer] (2021-08-26 00:00:00)

## 2022-05-31 ENCOUNTER — Telehealth: Payer: Self-pay | Admitting: Oncology

## 2022-05-31 NOTE — Telephone Encounter (Signed)
05/31/22 Spoke with wife and verified PET SCAN and next office visit

## 2022-06-01 ENCOUNTER — Inpatient Hospital Stay: Payer: BC Managed Care – PPO

## 2022-06-01 VITALS — BP 133/62 | HR 69 | Temp 98.2°F | Resp 20

## 2022-06-01 DIAGNOSIS — C187 Malignant neoplasm of sigmoid colon: Secondary | ICD-10-CM

## 2022-06-01 DIAGNOSIS — Z5112 Encounter for antineoplastic immunotherapy: Secondary | ICD-10-CM | POA: Diagnosis not present

## 2022-06-01 DIAGNOSIS — C787 Secondary malignant neoplasm of liver and intrahepatic bile duct: Secondary | ICD-10-CM

## 2022-06-01 MED ORDER — HEPARIN SOD (PORK) LOCK FLUSH 100 UNIT/ML IV SOLN
500.0000 [IU] | Freq: Once | INTRAVENOUS | Status: AC | PRN
  Administered 2022-06-01: 500 [IU]

## 2022-06-01 MED ORDER — SODIUM CHLORIDE 0.9% FLUSH
10.0000 mL | INTRAVENOUS | Status: DC | PRN
  Administered 2022-06-01: 10 mL

## 2022-06-01 NOTE — Patient Instructions (Signed)
Fluorouracil Injection What is this medication? FLUOROURACIL (flure oh YOOR a sil) treats some types of cancer. It works by slowing down the growth of cancer cells. This medicine may be used for other purposes; ask your health care provider or pharmacist if you have questions. COMMON BRAND NAME(S): Adrucil What should I tell my care team before I take this medication? They need to know if you have any of these conditions: Blood disorders Dihydropyrimidine dehydrogenase (DPD) deficiency Infection, such as chickenpox, cold sores, herpes Kidney disease Liver disease Poor nutrition Recent or ongoing radiation therapy An unusual or allergic reaction to fluorouracil, other medications, foods, dyes, or preservatives If you or your partner are pregnant or trying to get pregnant Breast-feeding How should I use this medication? This medication is injected into a vein. It is administered by your care team in a hospital or clinic setting. Talk to your care team about the use of this medication in children. Special care may be needed. Overdosage: If you think you have taken too much of this medicine contact a poison control center or emergency room at once. NOTE: This medicine is only for you. Do not share this medicine with others. What if I miss a dose? Keep appointments for follow-up doses. It is important not to miss your dose. Call your care team if you are unable to keep an appointment. What may interact with this medication? Do not take this medication with any of the following: Live virus vaccines This medication may also interact with the following: Medications that treat or prevent blood clots, such as warfarin, enoxaparin, dalteparin This list may not describe all possible interactions. Give your health care provider a list of all the medicines, herbs, non-prescription drugs, or dietary supplements you use. Also tell them if you smoke, drink alcohol, or use illegal drugs. Some items may  interact with your medicine. What should I watch for while using this medication? Your condition will be monitored carefully while you are receiving this medication. This medication may make you feel generally unwell. This is not uncommon as chemotherapy can affect healthy cells as well as cancer cells. Report any side effects. Continue your course of treatment even though you feel ill unless your care team tells you to stop. In some cases, you may be given additional medications to help with side effects. Follow all directions for their use. This medication may increase your risk of getting an infection. Call your care team for advice if you get a fever, chills, sore throat, or other symptoms of a cold or flu. Do not treat yourself. Try to avoid being around people who are sick. This medication may increase your risk to bruise or bleed. Call your care team if you notice any unusual bleeding. Be careful brushing or flossing your teeth or using a toothpick because you may get an infection or bleed more easily. If you have any dental work done, tell your dentist you are receiving this medication. Avoid taking medications that contain aspirin, acetaminophen, ibuprofen, naproxen, or ketoprofen unless instructed by your care team. These medications may hide a fever. Do not treat diarrhea with over the counter products. Contact your care team if you have diarrhea that lasts more than 2 days or if it is severe and watery. This medication can make you more sensitive to the sun. Keep out of the sun. If you cannot avoid being in the sun, wear protective clothing and sunscreen. Do not use sun lamps, tanning beds, or tanning booths. Talk to  your care team if you or your partner wish to become pregnant or think you might be pregnant. This medication can cause serious birth defects if taken during pregnancy and for 3 months after the last dose. A reliable form of contraception is recommended while taking this  medication and for 3 months after the last dose. Talk to your care team about effective forms of contraception. Do not father a child while taking this medication and for 3 months after the last dose. Use a condom while having sex during this time period. Do not breastfeed while taking this medication. This medication may cause infertility. Talk to your care team if you are concerned about your fertility. What side effects may I notice from receiving this medication? Side effects that you should report to your care team as soon as possible: Allergic reactions--skin rash, itching, hives, swelling of the face, lips, tongue, or throat Heart attack--pain or tightness in the chest, shoulders, arms, or jaw, nausea, shortness of breath, cold or clammy skin, feeling faint or lightheaded Heart failure--shortness of breath, swelling of the ankles, feet, or hands, sudden weight gain, unusual weakness or fatigue Heart rhythm changes--fast or irregular heartbeat, dizziness, feeling faint or lightheaded, chest pain, trouble breathing High ammonia level--unusual weakness or fatigue, confusion, loss of appetite, nausea, vomiting, seizures Infection--fever, chills, cough, sore throat, wounds that don't heal, pain or trouble when passing urine, general feeling of discomfort or being unwell Low red blood cell level--unusual weakness or fatigue, dizziness, headache, trouble breathing Pain, tingling, or numbness in the hands or feet, muscle weakness, change in vision, confusion or trouble speaking, loss of balance or coordination, trouble walking, seizures Redness, swelling, and blistering of the skin over hands and feet Severe or prolonged diarrhea Unusual bruising or bleeding Side effects that usually do not require medical attention (report to your care team if they continue or are bothersome): Dry skin Headache Increased tears Nausea Pain, redness, or swelling with sores inside the mouth or throat Sensitivity  to light Vomiting This list may not describe all possible side effects. Call your doctor for medical advice about side effects. You may report side effects to FDA at 1-800-FDA-1088. Where should I keep my medication? This medication is given in a hospital or clinic. It will not be stored at home. NOTE: This sheet is a summary. It may not cover all possible information. If you have questions about this medicine, talk to your doctor, pharmacist, or health care provider.  2023 [employer] (2021-08-23 00:00:00)

## 2022-06-05 ENCOUNTER — Encounter: Payer: Self-pay | Admitting: Oncology

## 2022-06-06 ENCOUNTER — Other Ambulatory Visit: Payer: Self-pay

## 2022-06-09 ENCOUNTER — Other Ambulatory Visit: Payer: BC Managed Care – PPO

## 2022-06-09 ENCOUNTER — Other Ambulatory Visit (HOSPITAL_COMMUNITY): Payer: BC Managed Care – PPO

## 2022-06-13 ENCOUNTER — Ambulatory Visit: Payer: BC Managed Care – PPO

## 2022-06-14 ENCOUNTER — Encounter: Payer: Self-pay | Admitting: Oncology

## 2022-06-21 ENCOUNTER — Encounter (HOSPITAL_COMMUNITY)
Admission: RE | Admit: 2022-06-21 | Discharge: 2022-06-21 | Disposition: A | Discharge status: Home / Self Care | Payer: BC Managed Care – PPO | Source: Ambulatory Visit | Attending: Oncology | Admitting: Oncology

## 2022-06-21 DIAGNOSIS — C787 Secondary malignant neoplasm of liver and intrahepatic bile duct: Secondary | ICD-10-CM | POA: Insufficient documentation

## 2022-06-21 DIAGNOSIS — C187 Malignant neoplasm of sigmoid colon: Secondary | ICD-10-CM | POA: Insufficient documentation

## 2022-06-21 LAB — GLUCOSE, CAPILLARY: Glucose-Capillary: 116 mg/dL — ABNORMAL HIGH (ref 70–99)

## 2022-06-21 MED ORDER — FLUDEOXYGLUCOSE F - 18 (FDG) INJECTION
12.0000 | Freq: Once | INTRAVENOUS | Status: AC
  Administered 2022-06-21: 13.7 via INTRAVENOUS

## 2022-06-23 ENCOUNTER — Encounter: Payer: Self-pay | Admitting: Oncology

## 2022-06-23 ENCOUNTER — Inpatient Hospital Stay: Payer: BC Managed Care – PPO | Attending: Oncology | Admitting: Oncology

## 2022-06-23 ENCOUNTER — Inpatient Hospital Stay: Payer: BC Managed Care – PPO

## 2022-06-23 ENCOUNTER — Other Ambulatory Visit: Payer: Self-pay | Admitting: Hematology and Oncology

## 2022-06-23 ENCOUNTER — Other Ambulatory Visit: Payer: BC Managed Care – PPO

## 2022-06-23 ENCOUNTER — Ambulatory Visit: Payer: BC Managed Care – PPO | Admitting: Oncology

## 2022-06-23 VITALS — BP 142/102 | HR 79 | Temp 97.5°F | Resp 18 | Ht 78.0 in | Wt 275.9 lb

## 2022-06-23 DIAGNOSIS — C787 Secondary malignant neoplasm of liver and intrahepatic bile duct: Secondary | ICD-10-CM

## 2022-06-23 DIAGNOSIS — C187 Malignant neoplasm of sigmoid colon: Secondary | ICD-10-CM

## 2022-06-23 LAB — HEPATIC FUNCTION PANEL
ALT: 21 U/L (ref 10–40)
AST: 30 (ref 14–40)
Alkaline Phosphatase: 77 (ref 25–125)
Bilirubin, Total: 0.5

## 2022-06-23 LAB — BASIC METABOLIC PANEL
BUN: 12 (ref 4–21)
CO2: 26 — AB (ref 13–22)
Chloride: 105 (ref 99–108)
Creatinine: 1 (ref 0.6–1.3)
Glucose: 128
Potassium: 4.4 mEq/L (ref 3.5–5.1)
Sodium: 138 (ref 137–147)

## 2022-06-23 LAB — CBC AND DIFFERENTIAL
HCT: 43 (ref 41–53)
Hemoglobin: 14.3 (ref 13.5–17.5)
Neutrophils Absolute: 2.65
Platelets: 230 10*3/uL (ref 150–400)
WBC: 4.9

## 2022-06-23 LAB — COMPREHENSIVE METABOLIC PANEL
Albumin: 3.9 (ref 3.5–5.0)
Calcium: 9.1 (ref 8.7–10.7)

## 2022-06-23 LAB — CBC: RBC: 4.69 (ref 3.87–5.11)

## 2022-06-23 NOTE — Progress Notes (Signed)
[place]  [address] [place],  [place]  [address] [phone]  Clinic Day: 06/23/22    Referring physician: Angelina Sheriff, MD  ASSESSMENT & PLAN:   Assessment & Plan: Malignant neoplasm metastatic to liver [facility]) History of liver metastasis in January 2021 treated with resection alone.  He had recurrent liver metastasis treated with a partial hepatectomy in June of 2023.  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  There was 1 negative node but margins were close.  This was attached to the diaphragm but the tissue from the diaphragm is benign.  He had a complicated postoperative course developing an cyst requiring drainage.  He then developed a right pleural effusion in July treated with a VATS procedure. He did have postoperative complications including biliary leak and had ERCP with placement of an endoscopic stent into the biliary duct.  He was readmitted on June 27 with abscess and sepsis and perihepatic fluid collection that required placement of a drain. He is receiving chemotherapy with FOLFIRI and Avastin, which he started in August.  His third cycle of chemotherapy was delayed as he was admitted with sepsis.  He completed IV antibiotics as an outpatient. CT abdomen pelvis from 01/07/2022 showed 12 mm short axis lymph node identified in the region of the porta hepatis. His MRI abdomen in early December, 2023 at [facility] reveals nearly complete right hepatectomy without residual or recurrent metastatic disease, and no evidence of extrahepatic abdominal metastases. He has now had a PET scan which does show recurrence again, with 2 lesions in the liver so we are discussing new options of treatment.    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  He was treated with surgical resection followed by 6 months of adjuvant chemotherapy with FOLFOX. He had  ascites and peritoneal nodules in 2018 with negative biopsy and negative fluid cytology.  He was taken for exploratory laparotomy with removal of the nodule which is found to be benign.  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  We discussed postoperative chemotherapy, but ultimately decided on surveillance. He developed another liver metastasis in June of 2023, also resected.   Iron deficiency anemia due to chronic blood loss Microcytic anemia, which is stable. He denies any overt form of blood loss. Iron studies were equivocal in July. His HGB today is better at 14.3 and his iron has been stopped.   Plan:  His PET scan showed 2 foci of abnormal increased radiotracer uptake identified within the dome of the liver and along the posterior resection margin, which appear to be recurrent tumor, with a SUV of 6. One lesion is 1.5 cm and the other is 1.0 cm. I reviewed the images with the patient and his wife. There is a new non specific subpleural nodular density within the base of the lingula which has an SUV of 3.2. This is favored to represent an area of focal inflammation or infection, but I cannot rule out metastatic disease. I recommended that he not have another surgery and switch to a different plan of action. I informed them about LONSURF which is an oral chemotherapy agent, versus oral regorafenib. We can investigate if there are any clinical trials available. I will proceed with testing of his tumor with Guardant Liquid 360. If this is not helpful, we can run  this genomic testing on his tumor directly.  I explained the process of the liquid 360 test. I informed them of all options we can take. Patient agreed to sign his Guardant form. He will meet with our oncology pharmacist to review the medication in detail once the decision is made. The patient and his wife inquired about a liver transplant and I explained to them the risks of  recurrence in the transplanted liver. 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  I will call him after we have investigated all of these options to make a decision within 1-2 weeks. The patient understands the plans discussed today and is in agreement with them.  He knows to contact our office if he develops concerns prior to his next appointment.   I provided 35 minutes of face-to-face time during this encounter and > 50% was spent counseling as documented under my assessment and plan.   Derwood Kaplan, MD  [place] [address] [place] [address] Dept: [phone] Dept Fax: [phone]   No orders of the defined types were placed in this encounter.   CHIEF COMPLAINT:  CC: Recurrent colon cancer  Current Treatment: FOLFIRI/bevacizumab  HISTORY OF PRESENT ILLNESS:  The patient is a 52 y.o. 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  He presented with fevers and chills in September 2017 and was found to have diverticulitis, which was treated.  CT at that time revealed a 1.7 cm lesion in the liver, which was nonspecific, as well as ectopic left kidney with a cyst.  He was then brought back for a colonoscopy, which revealed a 3.5 cm mass in the sigmoid.  Biopsy revealed high-grade dysplasia.  CT abdomen and pelvis revealed an indeterminate lesion in the liver in addition to wall thickening in the sigmoid colon.  MRI abdomen revealed the lesion in the liver to represent complex cyst versus vascular lesion, however, follow-up in 6 months was recommended.  Air contrast barium enema revealed an apple-core lesion of the sigmoid colon.  Baseline CEA was 0.6.  He underwent surgical resection in November 2017.  Pathology revealed a 4 cm, grade 2, adenocarcinoma of the sigmoid colon, as well as chronic active diverticulitis with an area of perforation, however, the tumor was not perforated.  Tumor invaded through  the muscularis propria into the pericolorectal tissues.  4/21 nodes were positive for metastasis.  Margins were clear.  MMR and MSI were normal.  KRAS and BRAF mutations were negative.  He had iron deficiency treated with oral iron supplement in the form of Hemocyte daily.  Due to his age of diagnosis and family history he underwent testing for hereditary non polyposis colorectal cancer with the Myriad myRisk Hereditary Cancer Panel test.  This did not reveal any clinically significant mutation or variants of uncertain significance.  Repeat MRI abdomen in January 2018 revealed a stable lesion within the right lobe of the liver, most consistent with benign lesion.  There was new small volume ascites seen.  The patient received adjuvant chemotherapy with FOLFOX  for 12 cycles, which was completed in June.  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  He was placed on gabapentin in July, then noticed abdominal bloating, which he attributed to the gabapentin, so he discontinued that after 1 month.  He was seen for routine follow-up in September 2018 with a repeat MRI abdomen to re-evaluate  the liver lesion.  That revealed a large volume ascites with findings suspicious for peritoneal disease.  The lesion in the right lobe of the liver was stable and still assistant with a benign lesion.  The patient underwent paracentesis with removal of 4.7 L of fluid, but fluid cytology was negative for malignancy.  PET scan in early October did not reveal any hypermetabolic activity, but moderate ascites was seen.  Ultrasound-guided peritoneal biopsy was negative for malignancy, revealing adipose tissue with fibrosis, patchy inflammation and minimal atypia.  His case was discussed at tumor conference and he was referred to Dr. Noberto Retort for consideration open biopsy.  Dr. Noberto Retort performed colonoscopy in October 2018, which was negative.  Follow-up colonoscopy in 3  years was recommended.  The patient then underwent exploratory laparotomy with multiple peritoneal biopsies.  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  We therefore recommended observation for the patient.  In November 2018, he was seen in the emergency room with abdominal pain, nausea and vomiting.  Gallbladder ultrasound revealed cholelithiasis with sludge and polyps, as well as mild gallbladder wall thickening.  Mild ascites was also seen.  CT abdomen and pelvis revealed mild diffuse gallbladder wall edema otherwise unchanged from PET-CT done in October.  He underwent cholecystectomy with findings of acute cholecystitis.  Pathology did not reveal any evidence of malignancy.    He was seen off schedule in April 2019, as he presented to Dr. Janace Aris office with abdominal pain and was found to have an elevated bilirubin and liver transaminases.  Due to the laboratory abnormalities, he underwent repeat MRI abdomen at that time, which revealed a stable lesion in the right hepatic lobe, which was felt to be indeterminate, with a 2nd smaller indeterminate lesion in the right hepatic lobe measuring 12 mm, which was new from previous imaging.  No hepatic steatosis or ascites was seen.  CEA was normal.  Hepatitis panel and CMV were negative  we felt the laboratory abnormalities were most likely secondary to viral illness.  He was followed closely and underwent a repeat MRI abdomen in June, which remained stable. He had one again in December of 2019, which was stable.  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  MRI abdomen in January 2021 revealed the mass  in the junction of segments 7 and 8 in the liver has increased in size compared to the prior exam, currently 2.9 x 2.1 x 2.5 cm.  The lesion has a branching component, some of which may be from portal vein thrombus or tumor thrombus, even this branching component appears to enhance on subtraction images.   Biopsy in February revealed adenocarcinoma with necrosis, consistent with metastasis from colon primary.  He underwent surgical resection in February with Dr. Rolla Etienne at [facility].  We discussed the option of further chemotherapy, including the risks and benefits, and he opted for surveillance.  MRI abdomen in May 2023 revealed another solitary metastasis in the liver.  Treated with partial hepatectomy in June with Dr. Cecil Cobbs.  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  There was 1 negative node, but margins were close.  This was attached to the diaphragm, but the tissue from the diaphragm was benign.  He had a complicated postoperative course developing an cyst requiring drainage.  He then developed a right pleural effusion in July treated with a VATS procedure.  He is receiving chemotherapy with FOLFIRI and Avastin, which he started in August   Oncology History  Colon cancer [facility])  01/23/2016 Cancer Staging   Staging form: Colon and Rectum, AJCC 7th Edition - Clinical stage from 01/23/2016: Stage IIIB (T3, N2a, M0) - Signed by Derwood Kaplan, MD on 05/03/2020 Staging comments: 3.5 cm MMR, MSI normal, No mutation of KRAS or BRAF, neg.genetics Rec'd 6 months adjuvant FOLFOX chemotherapy   02/02/2016 Initial Diagnosis   Colon cancer ([place])   12/13/2021 - 12/29/2021 Chemotherapy   Patient is on Treatment Plan : COLORECTAL FOLFIRI / BEVACIZUMAB Q14D     12/13/2021 - 06/01/2022 Chemotherapy   Patient is on Treatment Plan : COLORECTAL FOLFIRI + Bevacizumab q14d     07/12/2022 -  Chemotherapy   Patient is on Treatment Plan : COLORECTAL  Bevacizumab + Trifluridine/Tipiracil q28d     Malignant neoplasm metastatic to liver ([place])  05/29/2019 Initial Diagnosis   Malignant neoplasm metastatic to liver ([place])   12/13/2021 - 12/29/2021 Chemotherapy   Patient is on Treatment Plan : COLORECTAL FOLFIRI / BEVACIZUMAB Q14D     12/13/2021 - 06/01/2022 Chemotherapy   Patient is on Treatment Plan : COLORECTAL FOLFIRI + Bevacizumab q14d     01/07/2022 Imaging   CT abdomen and pelvis:  IMPRESSION:  1. Interval right hepatectomy with surgical changes in the dome of  the left liver.  2. 12 mm short axis lymph node identified in the region of the porta  hepatis. Attention on follow-up recommended.  3. Left pelvic kidney with large simple appearing cyst measuring  12.5 cm, increased from 7.8 cm on 03/14/2017.  4. No acute findings in the abdomen or pelvis. Specifically, no  findings to explain the patient's history of abdominal pain and  fever.    07/12/2022 -  Chemotherapy   Patient is on Treatment Plan : COLORECTAL Bevacizumab + Trifluridine/Tipiracil q28d       INTERVAL HISTORY:  Kamyar is here today for repeat clinical assessment for recurrent colon cancer and to review his PET scan results. He has had resection of liver metastases twice. Patient states that he is well and only complains of neuropathy in his feet. His PET scan showed 2 foci of abnormal increased radiotracer uptake identified within the dome of the liver and along the posterior resection margin, which appear to be recurrent tumor with a SUV of 6. One lesion is 1.5 cm and the other is 1.0 cm. I reviewed the images with the patient and his wife. There is a new non specific subpleural nodular density within the base of the lingula which has an SUV of 3.2. This is favored to represent an area of focal inflammation or infection, but I cannot rule out metastatic disease. I recommended that he not have another surgery but switch to a different plan of action. I informed them about LONSURF  which is an oral chemotherapy agent versus oral regorafenib. We can investigate if there are any clinical trials available. I will proceed with testing of his tumor cells with Guardant Liquid 360. If this is not helpful we can run this genomic testing on his tumor directly. I informed them of all options we can take. Patient agreed to sign his Guardant form. He will meet  with out oncology pharmacist to review the medication in detail once the decision is made. The patient and his wife inquired about a liver transplant and I explained to them the risks of recurrence. 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  I will call him after we have investigated all of these options to make a decision within 1-2 weeks. He denies signs of infection such as sore throat, sinus drainage, cough, or urinary symptoms.  He denies fevers or recurrent chills. He denies pain. He denies nausea, vomiting, chest pain, dyspnea or cough. His nappetite is great and his  weight has been stable. He is accompanied at today's visit by his wife.   REVIEW OF SYSTEMS:  Review of Systems  Constitutional:  Positive for fatigue. Negative for appetite change, chills, diaphoresis, fever and unexpected weight change.  HENT:  Negative.  Negative for hearing loss, lump/mass, mouth sores, nosebleeds, sore throat, tinnitus, trouble swallowing and voice change.   Eyes: Negative.  Negative for eye problems and icterus.  Respiratory: Negative.  Negative for chest tightness, cough, hemoptysis, shortness of breath and wheezing.   Cardiovascular: Negative.  Negative for chest pain, leg swelling and palpitations.  Gastrointestinal: Negative.  Negative for abdominal distention, abdominal pain, blood in stool, constipation, diarrhea, nausea, rectal pain and vomiting.  Endocrine: Negative.  Negative for hot flashes.  Genitourinary: Negative.  Negative for bladder incontinence, difficulty urinating, dyspareunia, dysuria, frequency, hematuria, nocturia, pelvic  pain and penile discharge.   Musculoskeletal: Negative.  Negative for arthralgias, back pain, flank pain, gait problem, myalgias, neck pain and neck stiffness.  Skin: Negative.  Negative for itching, rash and wound.  Neurological:  Positive for numbness (in his feet). Negative for dizziness, extremity weakness, gait problem, headaches, light-headedness, seizures and speech difficulty.  Hematological: Negative.  Negative for adenopathy. Does not bruise/bleed easily.  Psychiatric/Behavioral: Negative.  Negative for confusion, decreased concentration, depression, sleep disturbance and suicidal ideas. The patient is not nervous/anxious.      VITALS:  Blood pressure (!) 142/102, pulse 79, temperature (!) 97.5 F (36.4 C), temperature source Oral, resp. rate 18, height '6\' 6"'$  (1.981 m), weight 275 lb 14.4 oz (125.1 kg), SpO2 100 %.  Wt Readings from Last 3 Encounters:  06/23/22 275 lb 14.4 oz (125.1 kg)  05/30/22 276 lb (125.2 kg)  05/26/22 267 lb 12.8 oz (121.5 kg)    Body mass index is 31.88 kg/m.  Performance status (ECOG): 1 - Symptomatic but completely ambulatory  PHYSICAL EXAM:  Physical Exam Vitals and nursing note reviewed. Exam conducted with a chaperone present.  Constitutional:      General: He is not in acute distress.    Appearance: Normal appearance. He is normal weight. He is not ill-appearing, toxic-appearing or diaphoretic.  HENT:     Head: Normocephalic and atraumatic.     Right Ear: Tympanic membrane, ear canal and external ear normal. There is no impacted cerumen.     Left Ear: Tympanic membrane, ear canal and external ear normal. There is no impacted cerumen.     Nose: Nose normal. No congestion or rhinorrhea.     Mouth/Throat:     Mouth: Mucous membranes are moist.     Pharynx: Oropharynx is clear. No oropharyngeal exudate or posterior oropharyngeal erythema.  Eyes:     General: No scleral icterus.       Right eye: No discharge.        Left eye: No discharge.      Extraocular Movements: Extraocular movements intact.  Conjunctiva/sclera: Conjunctivae normal.     Pupils: Pupils are equal, round, and reactive to light.  Neck:     Vascular: No carotid bruit.  Cardiovascular:     Rate and Rhythm: Normal rate and regular rhythm.     Pulses: Normal pulses.     Heart sounds: Normal heart sounds. No murmur heard.    No friction rub. No gallop.  Pulmonary:     Effort: Pulmonary effort is normal. No respiratory distress.     Breath sounds: Normal breath sounds. No stridor. No wheezing, rhonchi or rales.  Chest:     Chest wall: No tenderness.  Abdominal:     General: Bowel sounds are normal. There is no distension.     Palpations: Abdomen is soft. There is no hepatomegaly, splenomegaly or mass.     Tenderness: There is no abdominal tenderness. There is no right CVA tenderness, left CVA tenderness, guarding or rebound.     Hernia: No hernia is present.  Genitourinary:    Rectum: Guaiac result negative.  Musculoskeletal:        General: No swelling, tenderness, deformity or signs of injury. Normal range of motion.     Cervical back: Normal range of motion and neck supple. No rigidity or tenderness.     Right lower leg: No edema.     Left lower leg: No edema.  Lymphadenopathy:     Cervical: No cervical adenopathy.     Upper Body:     Right upper body: No supraclavicular or axillary adenopathy.     Left upper body: No supraclavicular or axillary adenopathy.     Lower Body: No right inguinal adenopathy. No left inguinal adenopathy.  Skin:    General: Skin is warm and dry.     Coloration: Skin is not jaundiced or pale.     Findings: No bruising, erythema, lesion or rash.  Neurological:     General: No focal deficit present.     Mental Status: He is alert and oriented to person, place, and time. Mental status is at baseline.     Cranial Nerves: No cranial nerve deficit.     Sensory: No sensory deficit.     Motor: No weakness.     Coordination:  Coordination normal.     Gait: Gait normal.     Deep Tendon Reflexes: Reflexes normal.  Psychiatric:        Mood and Affect: Mood normal.        Behavior: Behavior normal.        Thought Content: Thought content normal.        Judgment: Judgment normal.     LABS:      Latest Ref Rng & Units 06/23/2022   12:00 AM 05/26/2022   12:00 AM 05/12/2022   12:00 AM  CBC  WBC  4.9     4.0     3.8      Hemoglobin 13.5 - 17.5 14.3     13.5     13.4      Hematocrit 41 - 53 43     41     39      Platelets 150 - 400 K/uL 230     210     203         This result is from an external source.      Latest Ref Rng & Units 06/23/2022   12:00 AM 05/26/2022   12:00 AM 04/28/2022   12:00 AM  CMP  BUN 4 -  $'21 12     16     14      'W$ Creatinine 0.6 - 1.3 1.0     1.1     1.0      Sodium 137 - 147 138     138     138      Potassium 3.5 - 5.1 mEq/L 4.4     4.3     4.5      Chloride 99 - 108 105     105     101      CO2 13 - '22 26     26     25      '$ Calcium 8.7 - 10.7 9.1     9.3     8.9      Alkaline Phos 25 - 125 77     58     70      AST 14 - 40 30     30     33      ALT 10 - 40 U/L '21     17     18         '$ This result is from an external source.   Lab Results  Component Value Date   CEA1 1.1 06/23/2022   /  CEA  Date Value Ref Range Status  06/23/2022 1.1 0.0 - 4.7 ng/mL Final    Comment:    (NOTE)                             Nonsmokers          <3.9                             Smokers             <5.6 [employer] Electrochemiluminescence Immunoassay (ECLIA) Values obtained with different assay methods or kits cannot be used interchangeably.  Results cannot be interpreted as absolute evidence of the presence or absence of malignant disease. Performed At: [facility] [place], [place] [id number] Rush Farmer MD [id number]         Component Ref Range & Units 2 d ago 8 mo ago  Glucose-Capillary 70 - 99 mg/dL 116 High  110 High  CM     No results found  for: "PSA1" No results found for: "[id number]" No results found for: "CAN125"  No results found for: "TOTALPROTELP", "ALBUMINELP", "A1GS", "A2GS", "BETS", "BETA2SER", "GAMS", "MSPIKE", "SPEI" Lab Results  Component Value Date   TIBC 225 (L) 11/22/2021   FERRITIN 292 11/22/2021   IRONPCTSAT 6 (L) 11/22/2021   No results found for: "LDH"  STUDIES:  EXAM: 06/21/2022 NUCLEAR MEDICINE PET SKULL BASE TO THIGH  IMPRESSION: 1. Status post right hepatectomy. There are 2 foci of abnormal increased radiotracer uptake identified within the dome of the liver and along the posterior resection margin, which are worrisome for recurrent tumor. 2. There is a new, nonspecific subpleural nodular density within the base of the lingula which has an SUV max of 3.20. This is favored to represent an area of focal inflammation or infection. Metastatic disease is considered less favored but not excluded. Attention on future surveillance imaging advised.  EXAM: 04/12/2022 MRI ABDOMEN WITHOUT AND WITH CONTRAST  IMPRESSION: 1. Near complete right hepatectomy, without residual/recurrent or new metastatic disease. 2. No evidence of extrahepatic abdominal metastasis.  3.  Aortic Atherosclerosis (ICD10-I70.0).  CT abdomen and pelvis 01/07/2022 IMPRESSION:  1. Interval right hepatectomy with surgical changes in the dome of  the left liver.  2. 12 mm short axis lymph node identified in the region of the porta  hepatis. Attention on follow-up recommended.  3. Left pelvic kidney with large simple appearing cyst measuring  12.5 cm, increased from 7.8 cm on 03/14/2017.  4. No acute findings in the abdomen or pelvis. Specifically, no  findings to explain the patient's history of abdominal pain and  fever.   NUCLEAR MEDICINE PET SKULL BASE TO THIGH 10/07/2021 TECHNIQUE: 14.0 mCi F-18 FDG was injected intravenously. Full-ring PET imaging was performed from the skull base to thigh after the radiotracer. CT data was  obtained and used for attenuation correction and anatomic localization. Fasting blood glucose: 110 mg/dl COMPARISON:  MRI 09/29/2021 and PET-CT 05/30/2019. Calcified granuloma identified in the right middle lobe, image 44/7. FINDINGS: Mediastinal blood pool activity: SUV max 2.86 Liver activity: SUV max NA NECK: No hypermetabolic lymph nodes in the neck. Incidental CT findings: none CHEST: No hypermetabolic mediastinal or hilar nodes. No suspicious pulmonary nodules on the CT scan. Incidental CT findings: none ABDOMEN/PELVIS: Tracer avid lesion within the dome of liver has an SUV max of 6.41, image 107 of the fused PET-CT images. This is not visible on the corresponding unenhanced CT images. On the PET-CT from 05/30/2019 this had an SUV max of 4.3. On the recent MRI of the abdomen this measured 2.2 x 2.5 x 2.7 cm. No additional abnormal foci of increased radiotracer uptake within the liver. No abnormal uptake identified within the pancreas, spleen or adrenal glands. No tracer avid right abdominopelvic lymph nodes. Anastomotic suture line is identified at the rectosigmoid junction. No signs of locally recurrent tumor. Incidental CT findings: Left pelvic kidney with large Bosniak class 1 exophytic cyst is again noted. No follow-up recommended. SKELETON: No focal hypermetabolic activity to suggest skeletal metastasis. Incidental CT findings: none IMPRESSION: 1. Increased radiotracer uptake is identified within the dome of liver corresponding to the enhancing liver lesion on recent MRI of the abdomen. 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 2. No additional sites of FDG avid disease.   MRI ABDOMEN WITHOUT AND WITH CONTRAST 09/29/2021 TECHNIQUE: Multiplanar multisequence MR imaging of the abdomen was performed both before and after the administration of intravenous contrast. CONTRAST:  54m GADAVIST GADOBUTROL 1 MMOL/ML IV SOLN COMPARISON:  Abdominal MRI  12/30/2020. FINDINGS: Lower chest: Unremarkable Hepatobiliary: Mild diffuse loss of signal intensity throughout the hepatic parenchyma, indicative of a background of mild hepatic steatosis. Again noted are areas of susceptibility artifact in the superior aspect of the right lobe of the liver related to prior partial hepatectomy. 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 No other definite suspicious hepatic lesions are noted. No intra or extrahepatic biliary ductal dilatation. Status post cholecystectomy. Common bile duct measures 4 mm in the porta hepatis. Pancreas: No pancreatic mass. No pancreatic ductal dilatation. No pancreatic or peripancreatic fluid collections or inflammatory changes. Spleen:  Unremarkable. Adrenals/Urinary Tract: Left kidney not visualized (previous imaging demonstrates a left pelvic kidney). Right kidney and bilateral adrenal glands are normal in appearance. No right hydroureteronephrosis  in the visualized portions of the abdomen. Stomach/Bowel: Visualized portions are unremarkable. Vascular/Lymphatic: No aneurysm identified in the visualized abdominal vasculature. No lymphadenopathy noted in the abdomen. Other: No significant volume of ascites noted in the visualized portions of the peritoneal cavity. Musculoskeletal: No aggressive appearing osseous lesions are noted in the visualized portions of the skeleton.   IMPRESSION: 1. 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 No new sites of metastatic disease are otherwise noted.   These results will be  called to the ordering clinician or representative by the Radiologist Assistant, and communication documented in the PACS or [employer].   HISTORY:   Past Medical History:  Diagnosis Date   GERD (gastroesophageal reflux disease)    Renal lithiasis     Past Surgical History:  Procedure Laterality Date   LIVER RESECTION Right 07/02/2019   LIVER RESECTION Right 10/12/2021   SIGMOIDECTOMY  03/2016    Family History  Problem Relation Age of Onset   Lung cancer Father 65   Leukemia Father 6       acute myelogenous   Colon cancer Sister 18    Social History:  reports that he has quit smoking. He has never used smokeless tobacco. He reports that he does not currently use alcohol. No history on file for drug use.The patient is accompanied by his wife today.  Allergies: No Known Allergies  Current Medications: Current Outpatient Medications  Medication Sig Dispense Refill   ascorbic acid (VITAMIN C) 1000 MG tablet Take by mouth.     benzonatate (TESSALON) 200 MG capsule Take 1 capsule (200 mg total) by mouth 3 (three) times daily as needed for cough. (Patient not taking: Reported on 03/02/2022) 20 capsule 1   Calcium Polycarbophil (FIBER) 625 MG TABS Take by mouth.     cetirizine (ZYRTEC) 10 MG tablet Take by mouth.     Cholecalciferol (VITAMIN D) 50 MCG (2000 UT) tablet Take by mouth.     Docusate Sodium (DSS) 100 MG CAPS Take 1 capsule by mouth daily.     hydrocortisone (ANUSOL-HC) 25 MG suppository Place 1 suppository (25 mg total) rectally 2 (two) times daily. 12 suppository 1   lisinopril (ZESTRIL) 20 MG tablet Take 1 tablet (20 mg total) by mouth daily. 30 tablet 5   loperamide (IMODIUM) 2 MG capsule Take 1 capsule (2 mg total) by mouth as needed for diarrhea or loose stools. Take 2 at diarrhea onset , then 1 every 2hr until 12hrs with no BM. May take 2 every 4hrs at night. If diarrhea recurs repeat. 100 capsule 5   LORazepam (ATIVAN) 1 MG tablet Take 1 tablet (1 mg  total) by mouth every 6 (six) hours as needed for anxiety or sleep. 100 tablet 0   magic mouthwash (nystatin, lidocaine, diphenhydrAMINE, alum & mag hydroxide) suspension SWISH AND/OR SWALLOW 5 ML BY MOUTH EVERY THREE HOURS IF NEEDED     Misc Natural Products (ELDERBERRY IMMUNE COMPLEX) CHEW Chew 1 Dose by mouth daily.     Multiple Vitamin (MULTI-VITAMIN) tablet Take 1 tablet by mouth daily.     omeprazole (PRILOSEC) 20 MG capsule Take by mouth.     ondansetron (ZOFRAN) 8 MG tablet Take 1 tablet (8 mg total) by mouth 2 (two) times daily as needed for refractory nausea / vomiting. Start on day 3 after chemotherapy. 30 tablet 1   ondansetron (ZOFRAN-ODT) 4 MG disintegrating tablet Take 4 mg by mouth  every 8 (eight) hours as needed. (Patient not taking: Reported on 03/02/2022)     oxyCODONE (OXY IR/ROXICODONE) 5 MG immediate release tablet Take by mouth.     Probiotic Product (PROBIOTIC BLEND PO) Take 1 tablet by mouth daily.     prochlorperazine (COMPAZINE) 10 MG tablet Take 1 tablet (10 mg total) by mouth every 6 (six) hours as needed (NAUSEA). (Patient not taking: Reported on 03/02/2022) 30 tablet 1   trifluridine-tipiracil (LONSURF) 20-8.19 MG tablet Take 4 tablets (80 mg of trifluridine total) by mouth 2 (two) times daily. 1hr after AM & PM meals days 1-5, 8-12. Repeat every 28d. 80 tablet 0   vitamin B-12 (CYANOCOBALAMIN) 250 MCG tablet Take by mouth.     zinc gluconate 50 MG tablet Take 50 mg by mouth daily.     No current facility-administered medications for this visit.   Facility-Administered Medications Ordered in Other Visits  Medication Dose Route Frequency Provider Last Rate Last Admin   atropine 1 MG/ML injection            palonosetron (ALOXI) 0.25 MG/5ML injection             I,Jasmine M Lassiter,acting as a scribe for Derwood Kaplan, MD.,have documented all relevant documentation on the behalf of Derwood Kaplan, MD,as directed by  Derwood Kaplan, MD while in the  presence of Derwood Kaplan, MD.

## 2022-06-24 LAB — CEA: CEA: 1.1 ng/mL (ref 0.0–4.7)

## 2022-07-03 ENCOUNTER — Other Ambulatory Visit: Payer: Self-pay | Admitting: Oncology

## 2022-07-03 ENCOUNTER — Other Ambulatory Visit: Payer: Self-pay | Admitting: Pharmacist

## 2022-07-03 DIAGNOSIS — C187 Malignant neoplasm of sigmoid colon: Secondary | ICD-10-CM

## 2022-07-03 DIAGNOSIS — C787 Secondary malignant neoplasm of liver and intrahepatic bile duct: Secondary | ICD-10-CM

## 2022-07-03 NOTE — Progress Notes (Signed)
START ON PATHWAY REGIMEN - Colorectal     A cycle is every 28 days:     Trifluridine and tipiracil      Bevacizumab-xxxx   **Always confirm dose/schedule in your pharmacy ordering system**  Patient Characteristics: Distant Metastases, Nonsurgical Candidate, KRAS/NRAS Wild-Type (BRAF V600 Wild-Type/Unknown), Standard Cytotoxic Therapy, Third Line Standard Cytotoxic Therapy, Prior Anti-EGFR Therapy Tumor Location: Colon Therapeutic Status: Distant Metastases Microsatellite/Mismatch Repair Status: MSS/pMMR BRAF Mutation Status: Wild-Type (no mutation) KRAS/NRAS Mutation Status: Wild-Type (no mutation) Preferred Therapy Approach: Standard Cytotoxic Therapy Standard Cytotoxic Line of Therapy: Third Line Standard Cytotoxic Therapy Intent of Therapy: Non-Curative / Palliative Intent, Discussed with Patient 

## 2022-07-03 NOTE — Progress Notes (Signed)
DISCONTINUE OFF PATHWAY REGIMEN - Colorectal   [id number]:FOLFIRI + Bevacizumab (Leucovorin IV D1 + Fluorouracil IV D1/CIV D1,2 + Irinotecan IV D1 + Bevacizumab IV D1) q14 Days:   A cycle is every 14 days:     Bevacizumab-xxxx      Irinotecan      Leucovorin      Fluorouracil      Fluorouracil   **Always confirm dose/schedule in your pharmacy ordering system**  REASON: Disease Progression PRIOR TREATMENT: Off Pathway: FOLFIRI + Bevacizumab (Leucovorin IV D1 + Fluorouracil IV D1/CIV D1,2 + Irinotecan IV D1 + Bevacizumab IV D1) q14 Days TREATMENT RESPONSE: Partial Response (PR)    Patient Characteristics: Distant Metastases, Nonsurgical Candidate, KRAS/NRAS Wild-Type (BRAF V600 Wild-Type/Unknown), Standard Cytotoxic Therapy, Second Line Standard Cytotoxic Therapy Tumor Location: Colon Therapeutic Status: Distant Metastases Microsatellite/Mismatch Repair Status: MSS/pMMR BRAF Mutation Status: Wild-Type (no mutation) KRAS/NRAS Mutation Status: Wild-Type (no mutation) Preferred Therapy Approach: Standard Cytotoxic Therapy Standard Cytotoxic Line of Therapy: Second Line Standard Cytotoxic Therapy Bevacizumab Eligibility: Eligible

## 2022-07-04 ENCOUNTER — Encounter: Payer: Self-pay | Admitting: Oncology

## 2022-07-04 ENCOUNTER — Telehealth: Payer: Self-pay

## 2022-07-04 ENCOUNTER — Other Ambulatory Visit (HOSPITAL_COMMUNITY): Payer: Self-pay

## 2022-07-04 ENCOUNTER — Telehealth: Payer: Self-pay | Admitting: Pharmacy Technician

## 2022-07-04 NOTE — Telephone Encounter (Signed)
Oral Oncology Patient Advocate Encounter   Began application for assistance for Lonsurf through [facility].   Application will be submitted upon completion of necessary supporting documentation.    [facility] phone number [phone].   I will continue to check the status until final determination.   Eduardo Hester, CPhT-Adv Oncology Pharmacy Patient [place] Direct Number: [phone]  Fax: [phone]

## 2022-07-04 NOTE — Telephone Encounter (Signed)
Oral Oncology Pharmacist Encounter  Received new prescription for trifluride-tipiracil Eduardo Hester) for the treatment of advanced colorectal cancer in conjunction with Avastin, planned duration until disease progression or unacceptable toxicity.  Labs from 06/23/2022 assessed, no interventions needed. Prescription not released to assess dose.   Current medication list in Epic reviewed, DDIs with *** identified:  Evaluated chart and no patient barriers to medication adherence noted.   Patient agreement for treatment documented in MD note on 06/23/22.  Prescription has been e-scribed to the [facility] for benefits analysis and approval.  Oral Oncology Clinic will continue to follow for insurance authorization, copayment issues, initial counseling and start date.  Drema Halon, PharmD Hematology/Oncology Clinical Pharmacist [facility] [phone] 07/04/2022 9:34 AM

## 2022-07-05 ENCOUNTER — Other Ambulatory Visit: Payer: Self-pay

## 2022-07-05 ENCOUNTER — Other Ambulatory Visit: Payer: Self-pay | Admitting: Oncology

## 2022-07-05 ENCOUNTER — Other Ambulatory Visit (HOSPITAL_COMMUNITY): Payer: Self-pay

## 2022-07-05 ENCOUNTER — Inpatient Hospital Stay (HOSPITAL_BASED_OUTPATIENT_CLINIC_OR_DEPARTMENT_OTHER): Payer: BC Managed Care – PPO

## 2022-07-05 DIAGNOSIS — C787 Secondary malignant neoplasm of liver and intrahepatic bile duct: Secondary | ICD-10-CM

## 2022-07-05 DIAGNOSIS — C187 Malignant neoplasm of sigmoid colon: Secondary | ICD-10-CM

## 2022-07-05 MED ORDER — TRIFLURIDINE-TIPIRACIL 20-8.19 MG PO TABS
80.0000 mg | ORAL_TABLET | Freq: Two times a day (BID) | ORAL | 0 refills | Status: DC
  Filled 2022-07-05: qty 80, 10d supply, fill #0

## 2022-07-05 NOTE — Progress Notes (Signed)
Oral Chemotherapy Pharmacist Encounter   I spoke with patient and wife in clinic for overview of: Lonsurf (trifluridine/tipiracil) for the treatment of metastatic colorectal cancer previously treated with FOLFIRI/ bevacizumab, planned duration until disease progression or unacceptable toxicity.    Counseled patient on administration, dosing, side effects, monitoring, drug-food interactions, safe handling, storage, and disposal.   Patient will take Lonsurf '20mg'$  (trifluridine component) tablets, 4 tablets ('80mg'$  trifluridine) by mouth twice daily, within 1 hour of finishing AM & PM meals, on days 1-5 and days 8-12, every 28 days.  Patient plans to take Lonsurf M-F, Saturday and Sunday off days, Lonsurf M-F again, then no tablets for the next 2 weeks. Patient will start D1 of each cycle on Mondays.  Patient is also getting this in combination with bevacizumab. Patients first day of bevacizumab is on 07/10/2022.   Lonsurf start date: pending upon when patients insurance is active or pending patient assistance   Adverse effects include but are not limited to: fatigue, nausea, vomiting, diarrhea, and decreased blood counts.    Patient has anti-emetic on hand and knows to take it if nausea develops.   Patient will obtain anti diarrheal and alert the office of 4 or more loose stools above baseline. Patient states that his wife helps with his medications and she is made aware to wear gloves when giving the medications.   Patient updated about CBC check on Cycle 1 Day 14.   Reviewed with patient importance of keeping a medication schedule and plan for any missed doses. No barriers to medication adherence identified.   Medication reconciliation performed and medication/allergy list updated.  Patient has insurance although patient is inactive from his current plan and has called his insurance company on 07/04/2022 and the insurance company states that they will work on fixing [employer] and it will take  2-3 days to fix. Patient states that they will call us when the insurance is fixed so that we can move ahead with the prior authorization.   Due to patient currently being uninsured, patient signed the patient assistance program forms while in clinic with me in case the insurance takes longer than expected.   Prescription will not be sent until determination of patient assistance or insurance is resolved.   Patient informed the pharmacy will reach out 5-7 days prior to needing next fill of Lonsurf to coordinate continued medication acquisition to prevent break in therapy.  All questions answered. Patient did have questions about when he could go back to work. Discussed with patient the pros and cons of going back to work and decision was made that patient will wait until the second cycle of therapy to decide if he is able to go back to work since he would not be able to go back out on disability once he goes back to work.   Patient voiced understanding and appreciation.    Medication education handout placed in mail for patient. Patient knows to call the office with questions or concerns. Oral Chemotherapy Clinic phone number provided to patient.    Drema Halon, PharmD Hematology/Oncology Clinical Pharmacist [facility] [phone] 07/05/2022   3:29 PM

## 2022-07-06 ENCOUNTER — Other Ambulatory Visit: Payer: Self-pay

## 2022-07-06 ENCOUNTER — Encounter: Payer: Self-pay | Admitting: Oncology

## 2022-07-06 ENCOUNTER — Other Ambulatory Visit (HOSPITAL_COMMUNITY): Payer: Self-pay

## 2022-07-06 NOTE — Telephone Encounter (Signed)
Oral Oncology Patient Advocate Encounter   Submitted application for assistance for Lonsurf to [facility].   Application submitted via e-fax to [place] phone number [phone].   I will continue to check the status until final determination.   Eduardo Hester, CPhT-Adv Oncology Pharmacy Patient [place] Direct Number: [phone]  Fax: [phone]

## 2022-07-07 ENCOUNTER — Other Ambulatory Visit: Payer: Self-pay

## 2022-07-07 ENCOUNTER — Encounter: Payer: Self-pay | Admitting: Oncology

## 2022-07-07 ENCOUNTER — Other Ambulatory Visit (HOSPITAL_COMMUNITY): Payer: Self-pay

## 2022-07-09 ENCOUNTER — Other Ambulatory Visit: Payer: Self-pay

## 2022-07-10 ENCOUNTER — Other Ambulatory Visit (HOSPITAL_COMMUNITY): Payer: Self-pay

## 2022-07-10 ENCOUNTER — Ambulatory Visit: Payer: BC Managed Care – PPO

## 2022-07-11 ENCOUNTER — Other Ambulatory Visit: Payer: Self-pay | Admitting: Hematology and Oncology

## 2022-07-11 ENCOUNTER — Other Ambulatory Visit (HOSPITAL_COMMUNITY): Payer: Self-pay

## 2022-07-11 ENCOUNTER — Other Ambulatory Visit: Payer: Self-pay | Admitting: Pharmacist

## 2022-07-11 DIAGNOSIS — C787 Secondary malignant neoplasm of liver and intrahepatic bile duct: Secondary | ICD-10-CM

## 2022-07-11 DIAGNOSIS — C187 Malignant neoplasm of sigmoid colon: Secondary | ICD-10-CM

## 2022-07-11 NOTE — Telephone Encounter (Signed)
Oral Oncology Patient Advocate Encounter   Called to check status of application. Representative informed me that attempts were being made to contact the patient so that the application can be reviewed for processing.  I notified the patient's wife, Eduardo Hester, and provided her with a number to call so this step can be completed.  I will continue to follow until final determination.  Eduardo Hester, CPhT-Adv Oncology Pharmacy Patient [place] Direct Number: [phone]  Fax: [phone]

## 2022-07-11 NOTE — Progress Notes (Signed)
Draw baseline labs same day as bevacizumab treatment, but may proceed with bevacizumab without results per Dr. Hinton Rao.

## 2022-07-12 ENCOUNTER — Inpatient Hospital Stay: Payer: BC Managed Care – PPO

## 2022-07-12 ENCOUNTER — Inpatient Hospital Stay: Payer: BC Managed Care – PPO | Attending: Oncology

## 2022-07-12 ENCOUNTER — Other Ambulatory Visit (HOSPITAL_COMMUNITY): Payer: Self-pay

## 2022-07-12 VITALS — BP 140/89 | HR 79 | Temp 98.0°F | Resp 18 | Wt 277.0 lb

## 2022-07-12 DIAGNOSIS — C187 Malignant neoplasm of sigmoid colon: Secondary | ICD-10-CM | POA: Insufficient documentation

## 2022-07-12 DIAGNOSIS — C787 Secondary malignant neoplasm of liver and intrahepatic bile duct: Secondary | ICD-10-CM | POA: Diagnosis not present

## 2022-07-12 DIAGNOSIS — G620 Drug-induced polyneuropathy: Secondary | ICD-10-CM | POA: Diagnosis not present

## 2022-07-12 DIAGNOSIS — Z5112 Encounter for antineoplastic immunotherapy: Secondary | ICD-10-CM | POA: Diagnosis not present

## 2022-07-12 LAB — CMP (CANCER CENTER ONLY)
ALT: 19 U/L (ref 0–44)
AST: 24 U/L (ref 15–41)
Albumin: 4.3 g/dL (ref 3.5–5.0)
Alkaline Phosphatase: 68 U/L (ref 38–126)
Anion gap: 7 (ref 5–15)
BUN: 15 mg/dL (ref 6–20)
CO2: 26 mmol/L (ref 22–32)
Calcium: 9.2 mg/dL (ref 8.9–10.3)
Chloride: 105 mmol/L (ref 98–111)
Creatinine: 0.99 mg/dL (ref 0.61–1.24)
GFR, Estimated: >60 mL/min (ref >60)
Glucose, Bld: 84 mg/dL (ref 70–99)
Potassium: 4.7 mmol/L (ref 3.5–5.1)
Sodium: 138 mmol/L (ref 135–145)
Total Bilirubin: 0.9 mg/dL (ref 0.3–1.2)
Total Protein: 6.8 g/dL (ref 6.5–8.1)

## 2022-07-12 LAB — CBC WITH DIFFERENTIAL (CANCER CENTER ONLY)
Abs Immature Granulocytes: 0.1 10*3/uL — ABNORMAL HIGH (ref 0.00–0.07)
Basophils Absolute: 0 10*3/uL (ref 0.0–0.1)
Basophils Relative: 0 %
Eosinophils Absolute: 0.1 10*3/uL (ref 0.0–0.5)
Eosinophils Relative: 2 %
HCT: 45.2 % (ref 39.0–52.0)
Hemoglobin: 14.9 g/dL (ref 13.0–17.0)
Immature Granulocytes: 1 %
Lymphocytes Relative: 21 %
Lymphs Abs: 1.6 10*3/uL (ref 0.7–4.0)
MCH: 30.3 pg (ref 26.0–34.0)
MCHC: 33 g/dL (ref 30.0–36.0)
MCV: 91.9 fL (ref 80.0–100.0)
Monocytes Absolute: 0.7 10*3/uL (ref 0.1–1.0)
Monocytes Relative: 10 %
Neutro Abs: 5 10*3/uL (ref 1.7–7.7)
Neutrophils Relative %: 66 %
Platelet Count: 214 10*3/uL (ref 150–400)
RBC: 4.92 MIL/uL (ref 4.22–5.81)
RDW: 15 % (ref 11.5–15.5)
WBC Count: 7.6 10*3/uL (ref 4.0–10.5)
nRBC: 0 % (ref 0.0–0.2)

## 2022-07-12 LAB — TOTAL PROTEIN, URINE DIPSTICK: Protein, ur: NEGATIVE mg/dL

## 2022-07-12 MED ORDER — SODIUM CHLORIDE 0.9 % IV SOLN: Freq: Once | INTRAVENOUS | Status: AC

## 2022-07-12 MED ORDER — SODIUM CHLORIDE 0.9% FLUSH: 10.0000 mL | INTRAVENOUS | Status: DC | PRN

## 2022-07-12 MED ORDER — SODIUM CHLORIDE 0.9 % IV SOLN
5.0000 mg/kg | Freq: Once | INTRAVENOUS | Status: AC
  Administered 2022-07-12: 600 mg via INTRAVENOUS
  Filled 2022-07-12: qty 16

## 2022-07-12 MED ORDER — HEPARIN SOD (PORK) LOCK FLUSH 100 UNIT/ML IV SOLN: 500.0000 [IU] | Freq: Once | INTRAVENOUS | Status: DC | PRN

## 2022-07-12 NOTE — Patient Instructions (Signed)
Bevacizumab Injection What is this medication? BEVACIZUMAB (be va SIZ yoo mab) treats some types of cancer. It works by blocking a protein that causes cancer cells to grow and multiply. This helps to slow or stop the spread of cancer cells. It is a monoclonal antibody. This medicine may be used for other purposes; ask your health care provider or pharmacist if you have questions. COMMON BRAND NAME(S): Alymsys, Avastin, MVASI, Noah Charon What should I tell my care team before I take this medication? They need to know if you have any of these conditions: Blood clots Coughing up blood Having or recent surgery Heart failure High blood pressure History of a connection between 2 or more body parts that do not usually connect (fistula) History of a tear in your stomach or intestines Protein in your urine An unusual or allergic reaction to bevacizumab, other medications, foods, dyes, or preservatives Pregnant or trying to get pregnant Breast-feeding How should I use this medication? This medication is injected into a vein. It is given by your care team in a hospital or clinic setting. Talk to your care team the use of this medication in children. Special care may be needed. Overdosage: If you think you have taken too much of this medicine contact a poison control center or emergency room at once. NOTE: This medicine is only for you. Do not share this medicine with others. What if I miss a dose? Keep appointments for follow-up doses. It is important not to miss your dose. Call your care team if you are unable to keep an appointment. What may interact with this medication? Interactions are not expected. This list may not describe all possible interactions. Give your health care provider a list of all the medicines, herbs, non-prescription drugs, or dietary supplements you use. Also tell them if you smoke, drink alcohol, or use illegal drugs. Some items may interact with your medicine. What should I  watch for while using this medication? Your condition will be monitored carefully while you are receiving this medication. You may need blood work while taking this medication. This medication may make you feel generally unwell. This is not uncommon as chemotherapy can affect healthy cells as well as cancer cells. Report any side effects. Continue your course of treatment even though you feel ill unless your care team tells you to stop. This medication may increase your risk to bruise or bleed. Call your care team if you notice any unusual bleeding. Before having surgery, talk to your care team to make sure it is ok. This medication can increase the risk of poor healing of your surgical site or wound. You will need to stop this medication for 28 days before surgery. After surgery, wait at least 28 days before restarting this medication. Make sure the surgical site or wound is healed enough before restarting this medication. Talk to your care team if questions. Talk to your care team if you may be pregnant. Serious birth defects can occur if you take this medication during pregnancy and for 6 months after the last dose. Contraception is recommended while taking this medication and for 6 months after the last dose. Your care team can help you find the option that works for you. Do not breastfeed while taking this medication and for 6 months after the last dose. This medication can cause infertility. Talk to your care team if you are concerned about your fertility. What side effects may I notice from receiving this medication? Side effects that you should report  to your care team as soon as possible: Allergic reactions--skin rash, itching, hives, swelling of the face, lips, tongue, or throat Bleeding--bloody or black, tar-like stools, vomiting blood or brown material that looks like coffee grounds, red or dark brown urine, small red or purple spots on skin, unusual bruising or bleeding Blood clot--pain,  swelling, or warmth in the leg, shortness of breath, chest pain Heart attack--pain or tightness in the chest, shoulders, arms, or jaw, nausea, shortness of breath, cold or clammy skin, feeling faint or lightheaded Heart failure--shortness of breath, swelling of the ankles, feet, or hands, sudden weight gain, unusual weakness or fatigue Increase in blood pressure Infection--fever, chills, cough, sore throat, wounds that don't heal, pain or trouble when passing urine, general feeling of discomfort or being unwell Infusion reactions--chest pain, shortness of breath or trouble breathing, feeling faint or lightheaded Kidney injury--decrease in the amount of urine, swelling of the ankles, hands, or feet Stomach pain that is severe, does not go away, or gets worse Stroke--sudden numbness or weakness of the face, arm, or leg, trouble speaking, confusion, trouble walking, loss of balance or coordination, dizziness, severe headache, change in vision Sudden and severe headache, confusion, change in vision, seizures, which may be signs of posterior reversible encephalopathy syndrome (PRES) Side effects that usually do not require medical attention (report to your care team if they continue or are bothersome): Back pain Change in taste Diarrhea Dry skin Increased tears Nosebleed This list may not describe all possible side effects. Call your doctor for medical advice about side effects. You may report side effects to FDA at 1-800-FDA-1088. Where should I keep my medication? This medication is given in a hospital or clinic. It will not be stored at home. NOTE: This sheet is a summary. It may not cover all possible information. If you have questions about this medicine, talk to your doctor, pharmacist, or health care provider.  2023 Elsevier/Gold Standard (2021-08-26 00:00:00)

## 2022-07-13 ENCOUNTER — Telehealth: Payer: Self-pay | Admitting: Pharmacy Technician

## 2022-07-13 ENCOUNTER — Encounter: Payer: Self-pay | Admitting: Oncology

## 2022-07-13 ENCOUNTER — Telehealth: Payer: Self-pay

## 2022-07-13 ENCOUNTER — Other Ambulatory Visit (HOSPITAL_COMMUNITY): Payer: Self-pay

## 2022-07-13 DIAGNOSIS — C187 Malignant neoplasm of sigmoid colon: Secondary | ICD-10-CM

## 2022-07-13 DIAGNOSIS — C787 Secondary malignant neoplasm of liver and intrahepatic bile duct: Secondary | ICD-10-CM

## 2022-07-13 MED ORDER — TRIFLURIDINE-TIPIRACIL 20-8.19 MG PO TABS: 80.0000 mg | ORAL_TABLET | Freq: Two times a day (BID) | ORAL | 0 refills | Status: DC

## 2022-07-13 NOTE — Telephone Encounter (Signed)
Oral Oncology Patient Advocate Encounter  Prior Authorization for Eduardo Hester has been approved.    PA# [id number] Effective dates: 07/13/22 through 07/13/23  Patient will fill at [place], [place] Patient [place] Direct Number: [phone]  Fax: [phone]

## 2022-07-13 NOTE — Telephone Encounter (Signed)
Oral Oncology Patient Advocate Encounter  Application placed on hold. Insurance is now active for patient. Prior Josem Kaufmann will be initiated.  Lady Deutscher, CPhT-Adv Oncology Pharmacy Patient [place] Direct Number: [phone]  Fax: [phone]

## 2022-07-13 NOTE — Telephone Encounter (Signed)
Oral Oncology Pharmacist Encounter  Patient was approved through insurance [place]. Since patient has [facility] [employer] [facility], [facility].  Drema Halon, PharmD Hematology/Oncology Clinical Pharmacist [facility] [phone]

## 2022-07-13 NOTE — Telephone Encounter (Signed)
Oral Oncology Patient Advocate Encounter   Was successful in obtaining a copay card for Lonsurf.  This copay card will make the patients copay $0.  The billing information is as follows and has been shared with [facility].   RxBin: [id number] PCN: [id number] Member ID: [id number] Group ID: [id number]   Lady Deutscher, CPhT-Adv Oncology Pharmacy Patient [place] Direct Number: [phone]  Fax: [phone]

## 2022-07-14 ENCOUNTER — Other Ambulatory Visit (HOSPITAL_COMMUNITY): Payer: Self-pay

## 2022-07-14 ENCOUNTER — Ambulatory Visit: Payer: BC Managed Care – PPO

## 2022-07-17 ENCOUNTER — Other Ambulatory Visit (HOSPITAL_COMMUNITY): Payer: Self-pay

## 2022-07-17 ENCOUNTER — Telehealth: Payer: Self-pay

## 2022-07-17 ENCOUNTER — Encounter: Payer: Self-pay | Admitting: Oncology

## 2022-07-17 NOTE — Telephone Encounter (Signed)
Oral Oncology Pharmacist Encounter  Patient received [facility]onsurf [facility] on Saturday, 07/15/22 and will start the [place] today, 07/17/22.   Drema Halon, PharmD Hematology/Oncology Clinical Pharmacist [facility] [phone]

## 2022-07-20 ENCOUNTER — Telehealth: Payer: Self-pay

## 2022-07-20 ENCOUNTER — Other Ambulatory Visit: Payer: Self-pay | Admitting: Oncology

## 2022-07-20 DIAGNOSIS — Z789 Other specified health status: Secondary | ICD-10-CM

## 2022-07-20 MED ORDER — OSELTAMIVIR PHOSPHATE 75 MG PO CAPS: 75.0000 mg | ORAL_CAPSULE | Freq: Two times a day (BID) | ORAL | 0 refills | Status: AC

## 2022-07-20 NOTE — Telephone Encounter (Signed)
Pt's wife called to ask if we need to treat Don prophylactic with Tamiflu (dtr positive for Flu A)?

## 2022-07-21 ENCOUNTER — Other Ambulatory Visit: Payer: Self-pay | Admitting: Oncology

## 2022-07-21 ENCOUNTER — Encounter: Payer: Self-pay | Admitting: Oncology

## 2022-07-21 DIAGNOSIS — C787 Secondary malignant neoplasm of liver and intrahepatic bile duct: Secondary | ICD-10-CM

## 2022-07-21 DIAGNOSIS — C187 Malignant neoplasm of sigmoid colon: Secondary | ICD-10-CM

## 2022-07-24 ENCOUNTER — Encounter: Payer: Self-pay | Admitting: Oncology

## 2022-07-24 ENCOUNTER — Inpatient Hospital Stay: Payer: BC Managed Care – PPO

## 2022-07-24 ENCOUNTER — Inpatient Hospital Stay (HOSPITAL_BASED_OUTPATIENT_CLINIC_OR_DEPARTMENT_OTHER): Payer: BC Managed Care – PPO | Admitting: Oncology

## 2022-07-24 ENCOUNTER — Other Ambulatory Visit: Payer: BC Managed Care – PPO

## 2022-07-24 VITALS — BP 141/100 | HR 80 | Temp 98.7°F | Resp 18 | Ht 78.0 in | Wt 282.2 lb

## 2022-07-24 DIAGNOSIS — Z5112 Encounter for antineoplastic immunotherapy: Secondary | ICD-10-CM | POA: Diagnosis not present

## 2022-07-24 DIAGNOSIS — Z09 Encounter for follow-up examination after completed treatment for conditions other than malignant neoplasm: Secondary | ICD-10-CM

## 2022-07-24 DIAGNOSIS — C787 Secondary malignant neoplasm of liver and intrahepatic bile duct: Secondary | ICD-10-CM

## 2022-07-24 DIAGNOSIS — C187 Malignant neoplasm of sigmoid colon: Secondary | ICD-10-CM

## 2022-07-24 LAB — CBC WITH DIFFERENTIAL (CANCER CENTER ONLY)
Abs Immature Granulocytes: 0.01 10*3/uL (ref 0.00–0.07)
Basophils Absolute: 0 10*3/uL (ref 0.0–0.1)
Basophils Relative: 1 %
Eosinophils Absolute: 0.1 10*3/uL (ref 0.0–0.5)
Eosinophils Relative: 2 %
HCT: 43.7 % (ref 39.0–52.0)
Hemoglobin: 14.5 g/dL (ref 13.0–17.0)
Immature Granulocytes: 0 %
Lymphocytes Relative: 19 %
Lymphs Abs: 1.2 10*3/uL (ref 0.7–4.0)
MCH: 30.2 pg (ref 26.0–34.0)
MCHC: 33.2 g/dL (ref 30.0–36.0)
MCV: 91 fL (ref 80.0–100.0)
Monocytes Absolute: 0.4 10*3/uL (ref 0.1–1.0)
Monocytes Relative: 7 %
Neutro Abs: 4.7 10*3/uL (ref 1.7–7.7)
Neutrophils Relative %: 71 %
Platelet Count: 205 10*3/uL (ref 150–400)
RBC: 4.8 MIL/uL (ref 4.22–5.81)
RDW: 14.1 % (ref 11.5–15.5)
WBC Count: 6.5 10*3/uL (ref 4.0–10.5)
nRBC: 0.3 % — ABNORMAL HIGH (ref 0.0–0.2)

## 2022-07-24 NOTE — Progress Notes (Unsigned)
[facility] Follow up  Visit:  Patient Care Team: Angelina Sheriff, MD as PCP - General (Family Medicine) Derwood Kaplan, MD as Consulting Physician (Oncology) Stitzenberg, Clint Lipps, MD as Referring Physician (Surgical Oncology)  CHIEF COMPLAINTS/PURPOSE OF CONSULTATION:  Oncology History  Colon cancer [facility])  01/23/2016 Cancer Staging   Staging form: Colon and Rectum, AJCC 7th Edition - Clinical stage from 01/23/2016: Stage IIIB (T3, N2a, M0) - Signed by Derwood Kaplan, MD on 05/03/2020 Staging comments: 3.5 cm MMR, MSI normal, No mutation of KRAS or BRAF, neg.genetics Rec'd 6 months adjuvant FOLFOX chemotherapy   02/02/2016 Initial Diagnosis   Colon cancer ([place])   12/13/2021 - 12/29/2021 Chemotherapy   Patient is on Treatment Plan : COLORECTAL FOLFIRI / BEVACIZUMAB Q14D     12/13/2021 - 06/01/2022 Chemotherapy   Patient is on Treatment Plan : COLORECTAL FOLFIRI + Bevacizumab q14d     07/12/2022 -  Chemotherapy   Patient is on Treatment Plan : COLORECTAL Bevacizumab + Trifluridine/Tipiracil q28d     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 Interval right hepatectomy with surgical changes in the dome of  the left liver.  2. 12 mm short axis lymph node identified in the region of the porta  hepatis. Attention on follow-up recommended.  3. Left pelvic kidney with large simple appearing cyst measuring  12.5 cm, increased from 7.8 cm on 03/14/2017.  4. No acute findings in the abdomen or pelvis. Specifically, no  findings to explain the patient's history of abdominal pain and  fever.     07/12/2022 -  Chemotherapy   Patient is on Treatment Plan : COLORECTAL Bevacizumab + Trifluridine/Tipiracil q28d       HISTORY OF PRESENTING ILLNESS: Eduardo Hester 52 y.o. 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  He was treated with resection followed by 6 months of adjuvant FOLFOX. He had ascites and peritoneal nodules in 2018 with negative biopsy and negative fluid cytology.  He was taken for exploratory laparotomy with removal of the nodule which is found to be benign.  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  We declined postoperative chemotherapy, and opted for surveillance. He developed another liver metastasis in June of 2023, also resected.   May 30, 2022:  Cycle 12 of FOLFIRI plus bevacizumab  June 21, 2022: PET/CT-stigmata of right hepatectomy.  2 foci of abnormal increased uptake within dome of liver along posterior resection margin worrisome for recurrence.  New nonspecific subpleural nodular density within base of lingula SUV 3.2 favored to represent area of inflammation or infection metastatic disease less favored  June 23, 2022: CEA 1.1  July 03, 2022: [employer] tumor mutational burden 0.95 (low)  MSI-high not detected The rest of the 83 gene panel including K-ras, NRAS, BRAF, HER2 and NTRK were negative  July 12, 2022: Cycle 1 Lonsurf/bevacizumab WBC 7.6 hemoglobin 14.9 platelet count 214; 66 segs 21 lymphs 10 monos  2 eos CMP normal  July 24 2022:  Scheduled follow up for rectal cancer.  Had one day of diarrhea since starting Lonsurf but overall tolerating it well.  Some fatigue.  Has gained 7 lbs but abdominal girth not increased.  No mouth sores.  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  Recently exposed to influenza A and is to complete the course of Tamiflu tomorrow.     Review  of Systems  Constitutional:  Negative for appetite change, chills, fatigue, fever and unexpected weight change.  HENT:   Negative for mouth sores, sore throat and tinnitus.        Mild epistaxis when blows his nose  Eyes:  Negative for eye problems and icterus.       Vision changes:  None  Respiratory:  Negative for chest tightness, cough, hemoptysis, shortness of breath and wheezing.        DOE on walking up stairs  Cardiovascular:  Negative for chest pain, leg swelling and palpitations.       PND:  none Orthopnea:  none  Gastrointestinal:  Negative for abdominal distention, abdominal pain, blood in stool, nausea and vomiting.  Endocrine: Negative for hot flashes.       Cold intolerance:  none Heat intolerance:  none  Genitourinary:  Negative for bladder incontinence, difficulty urinating, dysuria, frequency, hematuria and nocturia.   Musculoskeletal:  Negative for arthralgias, back pain, gait problem, myalgias, neck pain and neck stiffness.  Skin:  Negative for itching, rash and wound.  Neurological:  Positive for numbness. Negative for dizziness, extremity weakness, gait problem, headaches, light-headedness and seizures.  Hematological:  Negative for adenopathy. Does not bruise/bleed easily.  Psychiatric/Behavioral:  Negative for sleep disturbance and suicidal ideas. The patient is not nervous/anxious.     MEDICAL HISTORY: Past Medical History:  Diagnosis Date   GERD (gastroesophageal reflux disease)    Renal lithiasis     SURGICAL HISTORY: Past Surgical History:  Procedure Laterality Date   LIVER RESECTION Right 07/02/2019   LIVER RESECTION Right 10/12/2021   SIGMOIDECTOMY  03/2016    SOCIAL HISTORY: Social History   Socioeconomic History   Marital status: Married    Spouse name: Not on file   Number of children: 2   Years of education: Not on file   Highest education level: Not on file  Occupational History   Not on file  Tobacco Use   Smoking status: Former    Smokeless tobacco: Never  Substance and Sexual Activity   Alcohol use: Not Currently   Drug use: Not on file   Sexual activity: Not on file  Other Topics Concern   Not on file  Social History Narrative   Not on file   Social Determinants of Health   Financial Resource Strain: Not on file  Food Insecurity: Not on file  Transportation Needs: Not on file  Physical Activity: Not on file  Stress: Not on file  Social Connections: Not on file  Intimate Partner Violence: Not on file    FAMILY HISTORY Family History  Problem Relation Age of Onset   Lung cancer Father 53   Leukemia Father 49       acute myelogenous   Colon cancer Sister 17    ALLERGIES:  has No Known Allergies.  MEDICATIONS:  Current Outpatient Medications  Medication Sig Dispense Refill   Elderberry 500 MG CAPS Take 1 tablet by mouth daily.     ascorbic acid (VITAMIN C) 1000 MG tablet Take by mouth.  Calcium Polycarbophil (FIBER) 625 MG TABS Take by mouth.     cetirizine (ZYRTEC) 10 MG tablet Take by mouth.     Cholecalciferol (VITAMIN D) 50 MCG (2000 UT) tablet Take by mouth.     Docusate Sodium (DSS) 100 MG CAPS Take 1 capsule by mouth daily.     hydrocortisone (ANUSOL-HC) 25 MG suppository Place 1 suppository (25 mg total) rectally 2 (two) times daily. 12 suppository 1   lisinopril (ZESTRIL) 20 MG tablet Take 1 tablet (20 mg total) by mouth daily. 30 tablet 5   LONSURF 20-8.19 MG tablet TAKE 4 TABLETS BY MOUTH 2 TIMES DAILY ON DAYS 1-5 AND 8-12 OF EACH 28 DAY CYCLE. TAKE 1 HOUR AFTER MORNING AND EVENING MEALS. 80 tablet 0   loperamide (IMODIUM) 2 MG capsule Take 1 capsule (2 mg total) by mouth as needed for diarrhea or loose stools. Take 2 at diarrhea onset , then 1 every 2hr until 12hrs with no BM. May take 2 every 4hrs at night. If diarrhea recurs repeat. 100 capsule 5   LORazepam (ATIVAN) 1 MG tablet Take 1 tablet (1 mg total) by mouth every 6 (six) hours as needed for anxiety or sleep. 100 tablet 0    magic mouthwash (nystatin, lidocaine, diphenhydrAMINE, alum & mag hydroxide) suspension SWISH AND/OR SWALLOW 5 ML BY MOUTH EVERY THREE HOURS IF NEEDED     Misc Natural Products (ELDERBERRY IMMUNE COMPLEX) CHEW Chew 1 Dose by mouth daily.     Multiple Vitamin (MULTI-VITAMIN) tablet Take 1 tablet by mouth daily.     omeprazole (PRILOSEC) 20 MG capsule Take by mouth.     ondansetron (ZOFRAN) 8 MG tablet Take 1 tablet (8 mg total) by mouth 2 (two) times daily as needed for refractory nausea / vomiting. Start on day 3 after chemotherapy. 30 tablet 1   ondansetron (ZOFRAN-ODT) 4 MG disintegrating tablet Take 4 mg by mouth every 8 (eight) hours as needed. (Patient not taking: Reported on 03/02/2022)     oxyCODONE (OXY IR/ROXICODONE) 5 MG immediate release tablet Take by mouth.     Probiotic Product (PROBIOTIC BLEND PO) Take 1 tablet by mouth daily.     prochlorperazine (COMPAZINE) 10 MG tablet Take 1 tablet (10 mg total) by mouth every 6 (six) hours as needed (NAUSEA). (Patient not taking: Reported on 03/02/2022) 30 tablet 1   vitamin B-12 (CYANOCOBALAMIN) 250 MCG tablet Take by mouth.     zinc gluconate 50 MG tablet Take 50 mg by mouth daily.     No current facility-administered medications for this visit.   Facility-Administered Medications Ordered in Other Visits  Medication Dose Route Frequency Provider Last Rate Last Admin   atropine 1 MG/ML injection            palonosetron (ALOXI) 0.25 MG/5ML injection            sodium chloride flush (NS) 0.9 % injection 10 mL  10 mL Intracatheter PRN Derwood Kaplan, MD   10 mL at 07/26/22 1434    PHYSICAL EXAMINATION:  ECOG PERFORMANCE STATUS: 0 - Asymptomatic   Vitals:   07/24/22 1533 07/24/22 1534  BP: (!) 161/106 (!) 141/100  Pulse: 80   Resp: 18   Temp: 98.7 F (37.1 C)   SpO2: 100%     Filed Weights   07/24/22 1533  Weight: 282 lb 3.2 oz (128 kg)     Physical Exam Vitals and nursing note reviewed.  Constitutional:       General: He is not  in acute distress.    Appearance: Normal appearance. He is normal weight. He is not ill-appearing, toxic-appearing or diaphoretic.     Comments: Here with wife.    HENT:     Head: Normocephalic and atraumatic.     Right Ear: External ear normal.     Left Ear: External ear normal.     Nose: Nose normal.  Eyes:     General: No scleral icterus.    Conjunctiva/sclera: Conjunctivae normal.     Pupils: Pupils are equal, round, and reactive to light.  Cardiovascular:     Rate and Rhythm: Normal rate and regular rhythm.     Heart sounds:     No friction rub. No gallop.  Pulmonary:     Effort: Pulmonary effort is normal.     Breath sounds: Normal breath sounds. No wheezing or rales.  Abdominal:     Palpations: Abdomen is soft.     Tenderness: There is no abdominal tenderness. There is no guarding or rebound.  Musculoskeletal:        General: No swelling, tenderness or deformity. Normal range of motion.     Cervical back: Normal range of motion and neck supple. No rigidity or tenderness.     Right lower leg: No edema.  Lymphadenopathy:     Head:     Right side of head: No submental, submandibular, tonsillar, preauricular, posterior auricular or occipital adenopathy.     Left side of head: No submental, submandibular, tonsillar, preauricular, posterior auricular or occipital adenopathy.     Cervical: No cervical adenopathy.     Right cervical: No superficial, deep or posterior cervical adenopathy.    Left cervical: No superficial, deep or posterior cervical adenopathy.     Upper Body:     Right upper body: No supraclavicular or axillary adenopathy.     Left upper body: No supraclavicular or axillary adenopathy.     Lower Body: No right inguinal adenopathy. No left inguinal adenopathy.  Skin:    General: Skin is warm.     Coloration: Skin is not jaundiced.  Neurological:     General: No focal deficit present.     Mental Status: He is alert and oriented to person,  place, and time.     Cranial Nerves: No cranial nerve deficit.     Motor: No weakness.  Psychiatric:        Mood and Affect: Mood normal.        Behavior: Behavior normal.        Thought Content: Thought content normal.        Judgment: Judgment normal.      LABORATORY DATA: I have personally reviewed the data as listed:  Appointment on 07/24/2022  Component Date Value Ref Range Status   WBC Count 07/24/2022 6.5  4.0 - 10.5 K/uL Final   RBC 07/24/2022 4.80  4.22 - 5.81 MIL/uL Final   Hemoglobin 07/24/2022 14.5  13.0 - 17.0 g/dL Final   HCT 07/24/2022 43.7  39.0 - 52.0 % Final   MCV 07/24/2022 91.0  80.0 - 100.0 fL Final   MCH 07/24/2022 30.2  26.0 - 34.0 pg Final   MCHC 07/24/2022 33.2  30.0 - 36.0 g/dL Final   RDW 07/24/2022 14.1  11.5 - 15.5 % Final   Platelet Count 07/24/2022 205  150 - 400 K/uL Final   nRBC 07/24/2022 0.3 (H)  0.0 - 0.2 % Final   Neutrophils Relative % 07/24/2022 71  % Final   Neutro Abs  07/24/2022 4.7  1.7 - 7.7 K/uL Final   Lymphocytes Relative 07/24/2022 19  % Final   Lymphs Abs 07/24/2022 1.2  0.7 - 4.0 K/uL Final   Monocytes Relative 07/24/2022 7  % Final   Monocytes Absolute 07/24/2022 0.4  0.1 - 1.0 K/uL Final   Eosinophils Relative 07/24/2022 2  % Final   Eosinophils Absolute 07/24/2022 0.1  0.0 - 0.5 K/uL Final   Basophils Relative 07/24/2022 1  % Final   Basophils Absolute 07/24/2022 0.0  0.0 - 0.1 K/uL Final   Immature Granulocytes 07/24/2022 0  % Final   Abs Immature Granulocytes 07/24/2022 0.01  0.00 - 0.07 K/uL Final   Performed at [facility], [place] [address] [place], [place] [address]  Appointment on 07/12/2022  Component Date Value Ref Range Status   WBC Count 07/12/2022 7.6  4.0 - 10.5 K/uL Final   RBC 07/12/2022 4.92  4.22 - 5.81 MIL/uL Final   Hemoglobin 07/12/2022 14.9  13.0 - 17.0 g/dL Final   HCT 07/12/2022 45.2  39.0 - 52.0 % Final   MCV 07/12/2022 91.9  80.0 - 100.0 fL Final   MCH 07/12/2022 30.3  26.0 -  34.0 pg Final   MCHC 07/12/2022 33.0  30.0 - 36.0 g/dL Final   RDW 07/12/2022 15.0  11.5 - 15.5 % Final   Platelet Count 07/12/2022 214  150 - 400 K/uL Final   nRBC 07/12/2022 0.0  0.0 - 0.2 % Final   Neutrophils Relative % 07/12/2022 66  % Final   Neutro Abs 07/12/2022 5.0  1.7 - 7.7 K/uL Final   Lymphocytes Relative 07/12/2022 21  % Final   Lymphs Abs 07/12/2022 1.6  0.7 - 4.0 K/uL Final   Monocytes Relative 07/12/2022 10  % Final   Monocytes Absolute 07/12/2022 0.7  0.1 - 1.0 K/uL Final   Eosinophils Relative 07/12/2022 2  % Final   Eosinophils Absolute 07/12/2022 0.1  0.0 - 0.5 K/uL Final   Basophils Relative 07/12/2022 0  % Final   Basophils Absolute 07/12/2022 0.0  0.0 - 0.1 K/uL Final   Immature Granulocytes 07/12/2022 1  % Final   Abs Immature Granulocytes 07/12/2022 0.10 (H)  0.00 - 0.07 K/uL Final   Performed at [facility], [place] [address] [place], [place] [address]   Sodium 07/12/2022 138  135 - 145 mmol/L Final   Potassium 07/12/2022 4.7  3.5 - 5.1 mmol/L Final   Chloride 07/12/2022 105  98 - 111 mmol/L Final   CO2 07/12/2022 26  22 - 32 mmol/L Final   Glucose, Bld 07/12/2022 84  70 - 99 mg/dL Final   Glucose reference range applies only to samples taken after fasting for at least 8 hours.   BUN 07/12/2022 15  6 - 20 mg/dL Final   Creatinine 07/12/2022 0.99  0.61 - 1.24 mg/dL Final   Calcium 07/12/2022 9.2  8.9 - 10.3 mg/dL Final   Total Protein 07/12/2022 6.8  6.5 - 8.1 g/dL Final   Albumin 07/12/2022 4.3  3.5 - 5.0 g/dL Final   AST 07/12/2022 24  15 - 41 U/L Final   ALT 07/12/2022 19  0 - 44 U/L Final   Alkaline Phosphatase 07/12/2022 68  38 - 126 U/L Final   Total Bilirubin 07/12/2022 0.9  0.3 - 1.2 mg/dL Final   GFR, Estimated 07/12/2022 >60  >60 mL/min Final   Comment: (NOTE) Calculated using the CKD-EPI Creatinine Equation (2021)    Anion gap 07/12/2022 7  5 - 15 Final   Performed at [facility], [place] [address],  [place], [place] [address]   Protein, ur 07/12/2022 NEGATIVE  NEGATIVE mg/dL Final   Performed at [place] [address] [place], [place] [address]    RADIOGRAPHIC STUDIES: I have personally reviewed the radiological images as listed and agree with the findings in the report  No results found.  ASSESSMENT/PLAN 52 year old male with Stage IV rectal cancer whose only site of measurable disease is currently located in the dome of the liver  Stage IV rectal cancer (MSI low, TMB low, no actionable mutations in K-ras, NRAS, BRAF, HER2 and NTRK) Following resection of Stage IIIB disease in 2017 received adjuvant 6 months of adjuvant FOLFOX    January 2021 underwent resection of liver metastasis which was followed by FOLFIRi + Bevacizumab  PET/CT in February 2024 demonstrated 2 new liver lesions near resection margin which appeared while on chemotherapy thus representing progression Since there were actionable mutations patient was placed on Lonsurf/ Bevacizumab  Therapeutics July 24 2022- I contacted Dr. Cecil Cobbs and will refer patient to her for consideration of resection.  Tumor location is not optimal for resection.  Will also refer patient to Dr. Thressa Sheller for consideration of SBRT.             Cancer Staging  Colon cancer [facility]) Staging form: Colon and Rectum, AJCC 7th Edition - Clinical stage from 01/23/2016: Stage IIIB (T3, N2a, M0) - Signed by Derwood Kaplan, MD on 05/03/2020 Staged by: Managing physician Diagnostic confirmation: Positive histology Specimen type: Excision Histopathologic type: Adenocarcinoma, NOS Stage prefix: Initial diagnosis Lymph-vascular invasion (LVI): LVI not present (absent)/not identified Residual tumor (R): R0 - None Tumor deposits (TD): Absent Perineural invasion (PNI): Absent Microsatellite instability (MSI): Stable KRAS gene analysis: Normal Stage used in treatment planning: Yes National guidelines used in  treatment planning: Yes Type of national guideline used in treatment planning: NCCN Staging comments: 3.5 cm MMR, MSI normal, No mutation of KRAS or BRAF, neg.genetics Rec'd 6 months adjuvant FOLFOX chemotherapy    No problem-specific Assessment & Plan notes found for this encounter.    No orders of the defined types were placed in this encounter.   65  minutes was spent in patient care.  This included time spent preparing to see the patient (e.g., review of tests), obtaining and/or reviewing separately obtained history, counseling and educating the patient/family/caregiver, ordering medications, tests, or procedures; documenting clinical information in the electronic or other health record, independently interpreting results and communicating results to the patient/family/caregiver as well as coordination of care.       All questions were answered. The patient knows to call the clinic with any problems, questions or concerns.  This note was electronically signed.    Barbee Cough, MD  07/26/2022 5:55 PM

## 2022-07-25 ENCOUNTER — Telehealth: Payer: Self-pay

## 2022-07-25 NOTE — Telephone Encounter (Signed)
Patients wife in wanting note from yesterday faxed to his work [employer] [phone] with estimated return to work date , are you still working on this ? She is also wanting to know if you have talked with Dr Michel Harrow and if so any recommendations?

## 2022-07-26 ENCOUNTER — Inpatient Hospital Stay: Payer: BC Managed Care – PPO

## 2022-07-26 ENCOUNTER — Encounter: Payer: Self-pay | Admitting: Oncology

## 2022-07-26 ENCOUNTER — Telehealth: Payer: Self-pay

## 2022-07-26 VITALS — BP 156/91 | HR 88 | Temp 98.0°F | Resp 18 | Ht 78.0 in | Wt 280.0 lb

## 2022-07-26 DIAGNOSIS — Z5112 Encounter for antineoplastic immunotherapy: Secondary | ICD-10-CM | POA: Diagnosis not present

## 2022-07-26 DIAGNOSIS — C187 Malignant neoplasm of sigmoid colon: Secondary | ICD-10-CM

## 2022-07-26 DIAGNOSIS — C787 Secondary malignant neoplasm of liver and intrahepatic bile duct: Secondary | ICD-10-CM

## 2022-07-26 DIAGNOSIS — Z09 Encounter for follow-up examination after completed treatment for conditions other than malignant neoplasm: Secondary | ICD-10-CM | POA: Insufficient documentation

## 2022-07-26 MED ORDER — HEPARIN SOD (PORK) LOCK FLUSH 100 UNIT/ML IV SOLN
500.0000 [IU] | Freq: Once | INTRAVENOUS | Status: AC | PRN
  Administered 2022-07-26: 500 [IU]

## 2022-07-26 MED ORDER — SODIUM CHLORIDE 0.9% FLUSH
10.0000 mL | INTRAVENOUS | Status: DC | PRN
  Administered 2022-07-26: 10 mL

## 2022-07-26 MED ORDER — SODIUM CHLORIDE 0.9 % IV SOLN: Freq: Once | INTRAVENOUS | Status: AC

## 2022-07-26 MED ORDER — SODIUM CHLORIDE 0.9 % IV SOLN
5.0000 mg/kg | Freq: Once | INTRAVENOUS | Status: AC
  Administered 2022-07-26: 600 mg via INTRAVENOUS
  Filled 2022-07-26: qty 16

## 2022-07-26 NOTE — Patient Instructions (Signed)
Bevacizumab Injection What is this medication? BEVACIZUMAB (be va SIZ yoo mab) treats some types of cancer. It works by blocking a protein that causes cancer cells to grow and multiply. This helps to slow or stop the spread of cancer cells. It is a monoclonal antibody. This medicine may be used for other purposes; ask your health care provider or pharmacist if you have questions. COMMON BRAND NAME(S): Alymsys, Avastin, MVASI, Zirabev What should I tell my care team before I take this medication? They need to know if you have any of these conditions: Blood clots Coughing up blood Having or recent surgery Heart failure High blood pressure History of a connection between 2 or more body parts that do not usually connect (fistula) History of a tear in your stomach or intestines Protein in your urine An unusual or allergic reaction to bevacizumab, other medications, foods, dyes, or preservatives Pregnant or trying to get pregnant Breast-feeding How should I use this medication? This medication is injected into a vein. It is given by your care team in a hospital or clinic setting. Talk to your care team the use of this medication in children. Special care may be needed. Overdosage: If you think you have taken too much of this medicine contact a poison control center or emergency room at once. NOTE: This medicine is only for you. Do not share this medicine with others. What if I miss a dose? Keep appointments for follow-up doses. It is important not to miss your dose. Call your care team if you are unable to keep an appointment. What may interact with this medication? Interactions are not expected. This list may not describe all possible interactions. Give your health care provider a list of all the medicines, herbs, non-prescription drugs, or dietary supplements you use. Also tell them if you smoke, drink alcohol, or use illegal drugs. Some items may interact with your medicine. What should I  watch for while using this medication? Your condition will be monitored carefully while you are receiving this medication. You may need blood work while taking this medication. This medication may make you feel generally unwell. This is not uncommon as chemotherapy can affect healthy cells as well as cancer cells. Report any side effects. Continue your course of treatment even though you feel ill unless your care team tells you to stop. This medication may increase your risk to bruise or bleed. Call your care team if you notice any unusual bleeding. Before having surgery, talk to your care team to make sure it is ok. This medication can increase the risk of poor healing of your surgical site or wound. You will need to stop this medication for 28 days before surgery. After surgery, wait at least 28 days before restarting this medication. Make sure the surgical site or wound is healed enough before restarting this medication. Talk to your care team if questions. Talk to your care team if you may be pregnant. Serious birth defects can occur if you take this medication during pregnancy and for 6 months after the last dose. Contraception is recommended while taking this medication and for 6 months after the last dose. Your care team can help you find the option that works for you. Do not breastfeed while taking this medication and for 6 months after the last dose. This medication can cause infertility. Talk to your care team if you are concerned about your fertility. What side effects may I notice from receiving this medication? Side effects that you should report   to your care team as soon as possible: Allergic reactions--skin rash, itching, hives, swelling of the face, lips, tongue, or throat Bleeding--bloody or black, tar-like stools, vomiting blood or brown material that looks like coffee grounds, red or dark brown urine, small red or purple spots on skin, unusual bruising or bleeding Blood clot--pain,  swelling, or warmth in the leg, shortness of breath, chest pain Heart attack--pain or tightness in the chest, shoulders, arms, or jaw, nausea, shortness of breath, cold or clammy skin, feeling faint or lightheaded Heart failure--shortness of breath, swelling of the ankles, feet, or hands, sudden weight gain, unusual weakness or fatigue Increase in blood pressure Infection--fever, chills, cough, sore throat, wounds that don't heal, pain or trouble when passing urine, general feeling of discomfort or being unwell Infusion reactions--chest pain, shortness of breath or trouble breathing, feeling faint or lightheaded Kidney injury--decrease in the amount of urine, swelling of the ankles, hands, or feet Stomach pain that is severe, does not go away, or gets worse Stroke--sudden numbness or weakness of the face, arm, or leg, trouble speaking, confusion, trouble walking, loss of balance or coordination, dizziness, severe headache, change in vision Sudden and severe headache, confusion, change in vision, seizures, which may be signs of posterior reversible encephalopathy syndrome (PRES) Side effects that usually do not require medical attention (report to your care team if they continue or are bothersome): Back pain Change in taste Diarrhea Dry skin Increased tears Nosebleed This list may not describe all possible side effects. Call your doctor for medical advice about side effects. You may report side effects to FDA at 1-800-FDA-1088. Where should I keep my medication? This medication is given in a hospital or clinic. It will not be stored at home. NOTE: This sheet is a summary. It may not cover all possible information. If you have questions about this medicine, talk to your doctor, pharmacist, or health care provider.  2023 [employer] Standard (2021-08-26 00:00:00)  

## 2022-07-26 NOTE — Telephone Encounter (Signed)
I spoke with Mr and Mrs Mirante, as they were riding down the road. Mr Beauford takes the [place] @ 12n, and 8 pm, about 1 hr after he has eaten. No missed doses. He denies N/V, emesis, mouth sores, increased breathing problems (has dyspnea on exertion as baseline), chest pain, cough, chills, fever, headache, and skin rashes. He has noticed some fatigue. He also had 1 day of diarrhea, but feels that it was his fault due to diet. I reminded them to please call if he develops temp of 100.4 or higher, day or night. They verbalized understanding. Next appt's confirmed.

## 2022-07-27 ENCOUNTER — Telehealth: Payer: Self-pay

## 2022-07-27 NOTE — Addendum Note (Signed)
Addended by: Dorcas Mcmurray on: 07/27/2022 09:48 AM   Modules accepted: Orders

## 2022-07-27 NOTE — Telephone Encounter (Signed)
Patient came in earlier this week and saw Dr Federico Flake he has put in for two referrals. Can you write a work note to excuse patient until early May so he will have time to get referrals and decide on treatment plan thanks.

## 2022-07-27 NOTE — Telephone Encounter (Signed)
Patients wife aware both  referrals have been sent out, and last two office notes faxed to Human resources @ [phone].

## 2022-08-02 NOTE — Progress Notes (Signed)
[facility]  [address] [place],  [place]  [address] [phone]  Clinic Day: 08/04/22  Referring physician: Noni Saupe, MD  ASSESSMENT & PLAN:  Assessment & Plan: Malignant neoplasm metastatic to liver [facility]) History of liver metastasis in January 2021 treated with resection alone.  He had recurrent liver metastasis treated with a partial hepatectomy in June of 2023.  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  There was 1 negative node but margins were close.  This was attached to the diaphragm but the tissue from the diaphragm is benign.  He had a complicated postoperative course developing an cyst requiring drainage.  He then developed a right pleural effusion in July treated with a VATS procedure. He did have postoperative complications including biliary leak and had ERCP with placement of an endoscopic stent into the biliary duct.  He was readmitted on June 27 with abscess and sepsis and perihepatic fluid collection that required placement of a drain. He is receiving chemotherapy with FOLFIRI and Avastin, which he started in August.  His third cycle of chemotherapy was delayed as he was admitted with sepsis.  He completed IV antibiotics as an outpatient. CT abdomen/pelvis from 01/07/2022 showed 12 mm short axis lymph node identified in the region of the porta hepatis. His MRI abdomen in early December, 2023 at [facility] reveals nearly complete right hepatectomy without residual or recurrent metastatic disease, and no evidence of extrahepatic abdominal metastases. He has now had a PET scan which does show recurrence again, with 2 lesions in the liver so we placed him on Lonsurf with Bevacizumab which was started about 2 weeks ago. However, we want to explore any possible surgical or interventional approaches to his recurrent liver metastases, so his treatment has been put on hold.  He will be seeing Dr.  Glennis Brink and an interventional radiologist at [facility] on the same day on April 1st.   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  He was treated with surgical resection followed by 6 months of adjuvant chemotherapy with FOLFOX. He had ascites and peritoneal nodules in 2018 with negative biopsy and negative fluid cytology.  He was taken for exploratory laparotomy with removal of the nodule which is found to be benign.  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  We discussed postoperative chemotherapy, but ultimately decided on surveillance. He developed another liver metastasis in June of 2023, also resected. He now has recurrent liver metastases again. His PET scan showed 2 foci of abnormal increased radiotracer uptake identified within the dome of the liver and along the posterior resection margin, which appear to be recurrent tumor, with a SUV of 6. One lesion is 1.5 cm and the other is 1.0 cm. We will get an opinion as to whether any local intervention could be approached. We will hold his current treatment until that is decided.   Iron deficiency anemia due to chronic blood loss Microcytic anemia, which is stable. He denies any overt form of blood loss. Iron studies were equivocal in July. His HGB today is better at 14.3 and his iron has been stopped.   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   Plan:  His day 1 cycle 2 of Bevacizumab is scheduled on 08/09/2022, but I will cancel that. He has been  off Lonsurf for a week and will stay off until we decide. His wife and him know about the results of his Guardant testing, which was negative for any targetable mutations. I will see him back in 1 week with CBC and CMP to hear what the consultants from [facility] recommend. The patient understands the plans discussed today and is in agreement  with them.  He knows to contact our office if he develops concerns prior to his next appointment.   I provided 15 minutes of face-to-face time during this encounter and > 50% was spent counseling as documented under my assessment and plan.   [name], MD  [facility] AT [facility] [address] [place] [address] Dept: [phone] Dept Fax: [phone]   No orders of the defined types were placed in this encounter.   CHIEF COMPLAINT:  CC: Recurrent colon cancer  Current Treatment: FOLFIRI/bevacizumab  HISTORY OF PRESENT ILLNESS:  The patient is a 52 y.o. 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  He presented with fevers and chills in September 2017 and was found to have diverticulitis, which was treated.  CT at that time revealed a 1.7 cm lesion in the liver, which was nonspecific, as well as ectopic left kidney with a cyst.  He was then brought back for a colonoscopy, which revealed a 3.5 cm mass in the sigmoid.  Biopsy revealed high-grade dysplasia.  CT abdomen and pelvis revealed an indeterminate lesion in the liver in addition to wall thickening in the sigmoid colon.  MRI abdomen revealed the lesion in the liver to represent complex cyst versus vascular lesion, however, follow-up in 6 months was recommended.  Air contrast barium enema revealed an apple-core lesion of the sigmoid colon.  Baseline CEA was 0.6.  He underwent surgical resection in November 2017.  Pathology revealed a 4 cm, grade 2, adenocarcinoma of the sigmoid colon, as well as chronic active diverticulitis with an area of perforation, however, the tumor was not perforated.  Tumor invaded through the muscularis propria into the pericolorectal tissues.  4/21 nodes were positive for metastasis.  Margins were clear.  MMR and MSI were normal.  KRAS and BRAF mutations were negative.  He had iron deficiency treated with oral iron supplement in the  form of Hemocyte daily.  Due to his age of diagnosis and family history he underwent testing for hereditary non polyposis colorectal cancer with the Myriad myRisk Hereditary Cancer Panel test.  This did not reveal any clinically significant mutation or variants of uncertain significance.  Repeat MRI abdomen in January 2018 revealed a stable lesion within the right lobe of the liver, most consistent with benign lesion.  There was new small volume ascites seen.  The patient received adjuvant chemotherapy with FOLFOX  for 12 cycles, which was completed in June.  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  He was placed on gabapentin in July, then noticed abdominal bloating, which he attributed to the gabapentin, so he discontinued that after 1 month.  He was seen for routine follow-up in September 2018 with a repeat MRI abdomen to re-evaluate the liver lesion.  That revealed a large volume ascites with findings suspicious for peritoneal disease.  The lesion in the right lobe of the liver was stable and still assistant with a benign lesion.  The patient underwent paracentesis with removal of 4.7 L of fluid, but fluid cytology  was negative for malignancy.  PET scan in early October did not reveal any hypermetabolic activity, but moderate ascites was seen.  Ultrasound-guided peritoneal biopsy was negative for malignancy, revealing adipose tissue with fibrosis, patchy inflammation and minimal atypia.  His case was discussed at tumor conference and he was referred to Dr. Georgiana Shore for consideration open biopsy.  Dr. Georgiana Shore performed colonoscopy in October 2018, which was negative.  Follow-up colonoscopy in 3 years was recommended.  The patient then underwent exploratory laparotomy with multiple peritoneal biopsies.  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  We therefore recommended observation for the patient.  In November 2018, he was seen in the emergency room with abdominal pain, nausea and vomiting.  Gallbladder ultrasound revealed cholelithiasis with sludge and polyps, as well as mild gallbladder wall thickening.  Mild ascites was also seen.  CT abdomen and pelvis revealed mild diffuse gallbladder wall edema otherwise unchanged from PET-CT done in October.  He underwent cholecystectomy with findings of acute cholecystitis.  Pathology did not reveal any evidence of malignancy.    He was seen off schedule in April 2019, as he presented to Dr. Marnee Guarneri office with abdominal pain and was found to have an elevated bilirubin and liver transaminases.  Due to the laboratory abnormalities, he underwent repeat MRI abdomen at that time, which revealed a stable lesion in the right hepatic lobe, which was felt to be indeterminate, with a 2nd smaller indeterminate lesion in the right hepatic lobe measuring 12 mm, which was new from previous imaging.  No hepatic steatosis or ascites was seen.  CEA was normal.  Hepatitis panel and CMV were negative  we felt the laboratory abnormalities were most likely secondary to viral illness.  He was followed closely and underwent a repeat MRI abdomen in June, which remained stable. He had one again in December of 2019, which was stable.  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  MRI abdomen in January 2021 revealed the mass in the junction of segments 7 and 8 in the liver has increased in size compared to the prior exam, currently 2.9 x 2.1 x 2.5 cm.  The lesion has a branching component, some of which may be from portal vein thrombus or tumor thrombus, even this branching  component appears to enhance on subtraction images.   Biopsy in February revealed adenocarcinoma with necrosis, consistent with metastasis from colon primary.  He underwent surgical resection in February with Dr. Whitney Post at [facility].  We discussed the option of further chemotherapy, including the risks and benefits, and he opted for surveillance.  MRI abdomen in May 2023 revealed another solitary metastasis in the liver.  Treated with partial hepatectomy in June with Dr. Glennis Brink.  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  There was 1 negative node, but margins were close.  This was attached to the diaphragm, but the tissue from the diaphragm was benign.  He had a complicated postoperative course developing an cyst requiring drainage.  He then developed a right pleural effusion in July treated with a VATS procedure.  He is receiving chemotherapy with FOLFIRI and  Avastin, which he started in August   Oncology History  Colon cancer  01/23/2016 Cancer Staging   Staging form: Colon and Rectum, AJCC 7th Edition - Clinical stage from 01/23/2016: Stage IIIB (T3, N2a, M0) - Signed by [name], MD on 05/03/2020 Staging comments: 3.5 cm MMR, MSI normal, No mutation of KRAS or BRAF, neg.genetics Rec'd 6 months adjuvant FOLFOX chemotherapy   02/02/2016 Initial Diagnosis   Colon cancer (HCC)   12/13/2021 - 12/29/2021 Chemotherapy   Patient is on Treatment Plan : COLORECTAL FOLFIRI / BEVACIZUMAB Q14D     12/13/2021 - 06/01/2022 Chemotherapy   Patient is on Treatment Plan : COLORECTAL FOLFIRI + Bevacizumab q14d     07/12/2022 -  Chemotherapy   Patient is on Treatment Plan : COLORECTAL Bevacizumab + Trifluridine/Tipiracil q28d     Malignant neoplasm metastatic to liver  05/29/2019 Initial Diagnosis   Malignant neoplasm metastatic to liver (HCC)   12/13/2021 - 12/29/2021 Chemotherapy   Patient is on Treatment Plan : COLORECTAL FOLFIRI /  BEVACIZUMAB Q14D     12/13/2021 - 06/01/2022 Chemotherapy   Patient is on Treatment Plan : COLORECTAL FOLFIRI + Bevacizumab q14d     01/07/2022 Imaging   CT abdomen and pelvis:  IMPRESSION:  1. Interval right hepatectomy with surgical changes in the dome of  the left liver.  2. 12 mm short axis lymph node identified in the region of the porta  hepatis. Attention on follow-up recommended.  3. Left pelvic kidney with large simple appearing cyst measuring  12.5 cm, increased from 7.8 cm on 03/14/2017.  4. No acute findings in the abdomen or pelvis. Specifically, no  findings to explain the patient's history of abdominal pain and  fever.    07/12/2022 -  Chemotherapy   Patient is on Treatment Plan : COLORECTAL Bevacizumab + Trifluridine/Tipiracil q28d       INTERVAL HISTORY:  Eduardo Hester is here today for repeat clinical assessment for recurrent colon cancer. Patient states that he is ok and and has no complaints of pain. He will be seeing Dr. Glennis Brink and an interventional radiologist at [facility] on the same day on April 1st. Due to progression of disease he has been placed on Lonsurf oral chemotherapy. He is tolerating Lonsurf without difficulty. His day 1 cycle 2 of Bevacizumab is scheduled on 08/09/2022, but we will hold these treatments until decisions are made. He and his wife know about the results of his Guardant testing, which was negative for any targetable mutations. I will see him back in 1 week with CBC and CMP to see what was recommended at [facility]. He denies signs of infection such as sore throat, sinus drainage, cough, or urinary symptoms.  He denies fevers or recurrent chills. He denies pain. He denies nausea, vomiting, chest pain, dyspnea or cough. His appetite is good and his weight has decreased 1 pounds over last week .He is accompanied at today's visit by his wife.    REVIEW OF SYSTEMS:  Review of Systems  Constitutional:  Positive for fatigue. Negative for appetite  change, chills, diaphoresis, fever and unexpected weight change.  HENT:  Negative.  Negative for hearing loss, lump/mass, mouth sores, nosebleeds, sore throat, tinnitus, trouble swallowing and voice change.   Eyes: Negative.  Negative for eye problems and icterus.  Respiratory: Negative.  Negative for chest tightness, cough, hemoptysis, shortness of breath and wheezing.   Cardiovascular: Negative.  Negative for chest pain, leg swelling and palpitations.  Gastrointestinal: Negative.  Negative for  abdominal distention, abdominal pain, blood in stool, constipation, diarrhea, nausea, rectal pain and vomiting.  Endocrine: Negative.  Negative for hot flashes.  Genitourinary: Negative.  Negative for bladder incontinence, difficulty urinating, dyspareunia, dysuria, frequency, hematuria, nocturia, pelvic pain and penile discharge.   Musculoskeletal: Negative.  Negative for arthralgias, back pain, flank pain, gait problem, myalgias, neck pain and neck stiffness.  Skin: Negative.  Negative for itching, rash and wound.  Neurological:  Positive for numbness (in his feet). Negative for dizziness, extremity weakness, gait problem, headaches, light-headedness, seizures and speech difficulty.  Hematological: Negative.  Negative for adenopathy. Does not bruise/bleed easily.  Psychiatric/Behavioral: Negative.  Negative for confusion, decreased concentration, depression, sleep disturbance and suicidal ideas. The patient is not nervous/anxious.      VITALS:  Blood pressure (!) 116/94, pulse 90, temperature 98.1 F (36.7 C), temperature source Oral, resp. rate 20, height 6\' 6"  (1.981 m), weight 279 lb 9.6 oz (126.8 kg), SpO2 100 %.  Wt Readings from Last 3 Encounters:  08/11/22 279 lb 9.6 oz (126.8 kg)  08/04/22 279 lb 9.6 oz (126.8 kg)  07/26/22 280 lb (127 kg)    Body mass index is 32.31 kg/m.  Performance status (ECOG): 1 - Symptomatic but completely ambulatory  PHYSICAL EXAM:  Physical Exam Vitals and  nursing note reviewed. Exam conducted with a chaperone present.  Constitutional:      General: He is not in acute distress.    Appearance: Normal appearance. He is normal weight. He is not ill-appearing, toxic-appearing or diaphoretic.  HENT:     Head: Normocephalic and atraumatic.     Right Ear: Tympanic membrane, ear canal and external ear normal. There is no impacted cerumen.     Left Ear: Tympanic membrane, ear canal and external ear normal. There is no impacted cerumen.     Nose: Congestion and rhinorrhea present.     Mouth/Throat:     Mouth: Mucous membranes are moist.     Pharynx: Oropharynx is clear. No oropharyngeal exudate or posterior oropharyngeal erythema.  Eyes:     General: No scleral icterus.       Right eye: No discharge.        Left eye: No discharge.     Extraocular Movements: Extraocular movements intact.     Conjunctiva/sclera: Conjunctivae normal.     Pupils: Pupils are equal, round, and reactive to light.  Neck:     Vascular: No carotid bruit.  Cardiovascular:     Rate and Rhythm: Normal rate and regular rhythm.     Pulses: Normal pulses.     Heart sounds: Normal heart sounds. No murmur heard.    No friction rub. No gallop.  Pulmonary:     Effort: Pulmonary effort is normal. No respiratory distress.     Breath sounds: Normal breath sounds. No stridor. No wheezing, rhonchi or rales.  Chest:     Chest wall: No tenderness.  Abdominal:     General: Bowel sounds are normal. There is no distension.     Palpations: Abdomen is soft. There is no hepatomegaly, splenomegaly or mass.     Tenderness: There is no abdominal tenderness. There is no right CVA tenderness, left CVA tenderness, guarding or rebound.     Hernia: No hernia is present.  Genitourinary:    Rectum: Guaiac result negative.  Musculoskeletal:        General: No swelling, tenderness, deformity or signs of injury. Normal range of motion.     Cervical back: Normal range of motion  and neck supple. No  rigidity or tenderness.     Right lower leg: No edema.     Left lower leg: No edema.  Lymphadenopathy:     Cervical: No cervical adenopathy.     Upper Body:     Right upper body: No supraclavicular or axillary adenopathy.     Left upper body: No supraclavicular or axillary adenopathy.     Lower Body: No right inguinal adenopathy. No left inguinal adenopathy.  Skin:    General: Skin is warm and dry.     Coloration: Skin is not jaundiced or pale.     Findings: No bruising, erythema, lesion or rash.  Neurological:     General: No focal deficit present.     Mental Status: He is alert and oriented to person, place, and time. Mental status is at baseline.     Cranial Nerves: No cranial nerve deficit.     Sensory: No sensory deficit.     Motor: No weakness.     Coordination: Coordination normal.     Gait: Gait normal.     Deep Tendon Reflexes: Reflexes normal.  Psychiatric:        Mood and Affect: Mood normal.        Behavior: Behavior normal.        Thought Content: Thought content normal.        Judgment: Judgment normal.    LABS:      Latest Ref Rng & Units 08/11/2022   12:00 AM 08/04/2022   12:00 AM 07/24/2022    3:12 PM  CBC  WBC  3.9     4.1     6.5   Hemoglobin 13.5 - 17.5 14.3     14.3     14.5   Hematocrit 41 - 53 43     43     43.7   Platelets 150 - 400 K/uL 195     151     205      This result is from an external source.      Latest Ref Rng & Units 08/11/2022   12:00 AM 07/12/2022    4:05 PM 06/23/2022   12:00 AM  CMP  Glucose 70 - 99 mg/dL  84    BUN 4 - 21 14     15  12       Creatinine 0.6 - 1.3 1.0     0.99  1.0      Sodium 137 - 147 139     138  138      Potassium 3.5 - 5.1 mEq/L 4.5     4.7  4.4      Chloride 99 - 108 105     105  105      CO2 13 - 22 25     26  26       Calcium 8.7 - 10.7 9.2     9.2  9.1      Total Protein 6.5 - 8.1 g/dL  6.8    Total Bilirubin 0.3 - 1.2 mg/dL  0.9    Alkaline Phos 25 - 125 84     68  77      AST 14 - 40 31     24  30        ALT 10 - 40 U/L 31     19  21          This result is from an external source.   Lab Results  Component  Value Date   CEA1 1.1 06/23/2022   /  CEA  Date Value Ref Range Status  06/23/2022 1.1 0.0 - 4.7 ng/mL Final    Comment:    (NOTE)                             Nonsmokers          <3.9                             Smokers             <5.6 [employer] Electrochemiluminescence Immunoassay (ECLIA) Values obtained with different assay methods or kits cannot be used interchangeably.  Results cannot be interpreted as absolute evidence of the presence or absence of malignant disease. Performed At: [facility] [address] [place], [place] [id number] Jolene Schimke MD WU:[id number]         Component Ref Range & Units 2 d ago 8 mo ago  Glucose-Capillary 70 - 99 mg/dL 956 High  213 High  CM     No results found for: "PSA1" No results found for: "YQM578" No results found for: "CAN125"  No results found for: "TOTALPROTELP", "ALBUMINELP", "A1GS", "A2GS", "BETS", "BETA2SER", "GAMS", "MSPIKE", "SPEI" Lab Results  Component Value Date   TIBC 225 (L) 11/22/2021   FERRITIN 292 11/22/2021   IRONPCTSAT 6 (L) 11/22/2021   No results found for: "LDH"  STUDIES:  EXAM: 06/21/2022 NUCLEAR MEDICINE PET SKULL BASE TO THIGH  IMPRESSION: 1. Status post right hepatectomy. There are 2 foci of abnormal increased radiotracer uptake identified within the dome of the liver and along the posterior resection margin, which are worrisome for recurrent tumor. 2. There is a new, nonspecific subpleural nodular density within the base of the lingula which has an SUV max of 3.20. This is favored to represent an area of focal inflammation or infection. Metastatic disease is considered less favored but not excluded. Attention on future surveillance imaging advised.  EXAM: 04/12/2022 MRI ABDOMEN WITHOUT AND WITH CONTRAST  IMPRESSION: 1. Near complete right hepatectomy, without  residual/recurrent or new metastatic disease. 2. No evidence of extrahepatic abdominal metastasis. 3.  Aortic Atherosclerosis (ICD10-I70.0).  CT abdomen and pelvis 01/07/2022 IMPRESSION:  1. Interval right hepatectomy with surgical changes in the dome of  the left liver.  2. 12 mm short axis lymph node identified in the region of the porta  hepatis. Attention on follow-up recommended.  3. Left pelvic kidney with large simple appearing cyst measuring  12.5 cm, increased from 7.8 cm on 03/14/2017.  4. No acute findings in the abdomen or pelvis. Specifically, no  findings to explain the patient's history of abdominal pain and  fever.   NUCLEAR MEDICINE PET SKULL BASE TO THIGH 10/07/2021 TECHNIQUE: 14.0 mCi F-18 FDG was injected intravenously. Full-ring PET imaging was performed from the skull base to thigh after the radiotracer. CT data was obtained and used for attenuation correction and anatomic localization. Fasting blood glucose: 110 mg/dl COMPARISON:  MRI 46/96/2952 and PET-CT 05/30/2019. Calcified granuloma identified in the right middle lobe, image 44/7. FINDINGS: Mediastinal blood pool activity: SUV max 2.86 Liver activity: SUV max NA NECK: No hypermetabolic lymph nodes in the neck. Incidental CT findings: none CHEST: No hypermetabolic mediastinal or hilar nodes. No suspicious pulmonary nodules on the CT scan. Incidental CT findings: none ABDOMEN/PELVIS: Tracer avid lesion within the dome of liver has an SUV  max of 6.41, image 107 of the fused PET-CT images. This is not visible on the corresponding unenhanced CT images. On the PET-CT from 05/30/2019 this had an SUV max of 4.3. On the recent MRI of the abdomen this measured 2.2 x 2.5 x 2.7 cm. No additional abnormal foci of increased radiotracer uptake within the liver. No abnormal uptake identified within the pancreas, spleen or adrenal glands. No tracer avid right abdominopelvic lymph nodes. Anastomotic suture line is  identified at the rectosigmoid junction. No signs of locally recurrent tumor. Incidental CT findings: Left pelvic kidney with large Bosniak class 1 exophytic cyst is again noted. No follow-up recommended. SKELETON: No focal hypermetabolic activity to suggest skeletal metastasis. Incidental CT findings: none IMPRESSION: 1. Increased radiotracer uptake is identified within the dome of liver corresponding to the enhancing liver lesion on recent MRI of the abdomen. 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 2. No additional sites of FDG avid disease.   MRI ABDOMEN WITHOUT AND WITH CONTRAST 09/29/2021 TECHNIQUE: Multiplanar multisequence MR imaging of the abdomen was performed both before and after the administration of intravenous contrast. CONTRAST:  10mL GADAVIST GADOBUTROL 1 MMOL/ML IV SOLN COMPARISON:  Abdominal MRI 12/30/2020. FINDINGS: Lower chest: Unremarkable Hepatobiliary: Mild diffuse loss of signal intensity throughout the hepatic parenchyma, indicative of a background of mild hepatic steatosis. Again noted are areas of susceptibility artifact in the superior aspect of the right lobe of the liver related to prior partial hepatectomy. 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 No other definite suspicious hepatic lesions are noted. No intra or extrahepatic biliary ductal dilatation. Status post cholecystectomy. Common bile duct measures 4 mm in the porta hepatis. Pancreas: No pancreatic mass. No pancreatic ductal dilatation. No pancreatic or peripancreatic fluid collections or inflammatory changes. Spleen:  Unremarkable. Adrenals/Urinary Tract:  Left kidney not visualized (previous imaging demonstrates a left pelvic kidney). Right kidney and bilateral adrenal glands are normal in appearance. No right hydroureteronephrosis in the visualized portions of the abdomen. Stomach/Bowel: Visualized portions are unremarkable. Vascular/Lymphatic: No aneurysm identified in the visualized abdominal vasculature. No lymphadenopathy noted in the abdomen. Other: No significant volume of ascites noted in the visualized portions of the peritoneal cavity. Musculoskeletal: No aggressive appearing osseous lesions are noted in the visualized portions of the skeleton.   IMPRESSION: 1. 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 No new sites of metastatic disease are otherwise noted.   These results will be called to the ordering clinician or representative by the Radiologist Assistant, and communication documented in the PACS or [employer].   HISTORY:   Past Medical History:  Diagnosis Date   GERD (gastroesophageal reflux disease)    Renal lithiasis     Past Surgical History:  Procedure Laterality Date   LIVER RESECTION Right 07/02/2019   LIVER RESECTION Right 10/12/2021   SIGMOIDECTOMY  03/2016    Family History  Problem Relation Age of Onset   Lung cancer Father 49   Leukemia Father 82       acute myelogenous   Colon cancer Sister 38    Social History:  reports that he has quit smoking. He  has never used smokeless tobacco. He reports that he does not currently use alcohol. No history on file for drug use.The patient is accompanied by his wife today.  Allergies: No Known Allergies  Current Medications: Current Outpatient Medications  Medication Sig Dispense Refill   ascorbic acid (VITAMIN C) 1000 MG tablet Take by mouth.     Calcium Polycarbophil (FIBER) 625 MG TABS Take by mouth.     cetirizine (ZYRTEC) 10 MG tablet Take by  mouth.     Cholecalciferol (VITAMIN D) 50 MCG (2000 UT) tablet Take by mouth.     Docusate Sodium (DSS) 100 MG CAPS Take 1 capsule by mouth daily.     Elderberry 500 MG CAPS Take 1 tablet by mouth daily.     hydrocortisone (ANUSOL-HC) 25 MG suppository Place 1 suppository (25 mg total) rectally 2 (two) times daily. 12 suppository 1   lisinopril (ZESTRIL) 20 MG tablet Take 1 tablet (20 mg total) by mouth daily. 30 tablet 5   LONSURF 20-8.19 MG tablet TAKE 4 TABLETS BY MOUTH 2 TIMES DAILY ON DAYS 1-5 AND 8-12 OF EACH 28 DAY CYCLE. TAKE 1 HOUR AFTER MORNING AND EVENING MEALS. 80 tablet 0   loperamide (IMODIUM) 2 MG capsule Take 1 capsule (2 mg total) by mouth as needed for diarrhea or loose stools. Take 2 at diarrhea onset , then 1 every 2hr until 12hrs with no BM. May take 2 every 4hrs at night. If diarrhea recurs repeat. 100 capsule 5   LORazepam (ATIVAN) 1 MG tablet Take 1 tablet (1 mg total) by mouth every 6 (six) hours as needed for anxiety or sleep. 100 tablet 0   magic mouthwash (nystatin, lidocaine, diphenhydrAMINE, alum & mag hydroxide) suspension SWISH AND/OR SWALLOW 5 ML BY MOUTH EVERY THREE HOURS IF NEEDED     Misc Natural Products (ELDERBERRY IMMUNE COMPLEX) CHEW Chew 1 Dose by mouth daily.     Multiple Vitamin (MULTI-VITAMIN) tablet Take 1 tablet by mouth daily.     omeprazole (PRILOSEC) 20 MG capsule Take by mouth.     ondansetron (ZOFRAN) 8 MG tablet Take 1 tablet (8 mg total) by mouth 2 (two) times daily as needed for refractory nausea / vomiting. Start on day 3 after chemotherapy. 30 tablet 1   ondansetron (ZOFRAN-ODT) 4 MG disintegrating tablet Take 4 mg by mouth every 8 (eight) hours as needed. (Patient not taking: Reported on 03/02/2022)     oxyCODONE (OXY IR/ROXICODONE) 5 MG immediate release tablet Take by mouth.     Probiotic Product (PROBIOTIC BLEND PO) Take 1 tablet by mouth daily. (Patient not taking: Reported on 08/04/2022)     prochlorperazine (COMPAZINE) 10 MG tablet Take 1  tablet (10 mg total) by mouth every 6 (six) hours as needed (NAUSEA). (Patient not taking: Reported on 03/02/2022) 30 tablet 1   vitamin B-12 (CYANOCOBALAMIN) 250 MCG tablet Take by mouth.     zinc gluconate 50 MG tablet Take 50 mg by mouth daily.     No current facility-administered medications for this visit.   Facility-Administered Medications Ordered in Other Visits  Medication Dose Route Frequency Provider Last Rate Last Admin   atropine 1 MG/ML injection            palonosetron (ALOXI) 0.25 MG/5ML injection             I,Jasmine M Lassiter,acting as a scribe for [name], MD.,have documented all relevant documentation on the behalf of [name], MD,as directed by  [name],  MD while in the presence of [name], MD.

## 2022-08-04 ENCOUNTER — Inpatient Hospital Stay: Payer: BC Managed Care – PPO

## 2022-08-04 ENCOUNTER — Ambulatory Visit: Payer: BC Managed Care – PPO | Admitting: Oncology

## 2022-08-04 ENCOUNTER — Other Ambulatory Visit: Payer: Self-pay | Admitting: Oncology

## 2022-08-04 ENCOUNTER — Encounter: Payer: Self-pay | Admitting: Oncology

## 2022-08-04 ENCOUNTER — Inpatient Hospital Stay (HOSPITAL_BASED_OUTPATIENT_CLINIC_OR_DEPARTMENT_OTHER): Payer: BC Managed Care – PPO | Admitting: Oncology

## 2022-08-04 ENCOUNTER — Telehealth: Payer: Self-pay

## 2022-08-04 VITALS — BP 116/94 | HR 90 | Temp 98.1°F | Resp 20 | Ht 78.0 in | Wt 279.6 lb

## 2022-08-04 DIAGNOSIS — C187 Malignant neoplasm of sigmoid colon: Secondary | ICD-10-CM

## 2022-08-04 DIAGNOSIS — C787 Secondary malignant neoplasm of liver and intrahepatic bile duct: Secondary | ICD-10-CM

## 2022-08-04 LAB — CBC AND DIFFERENTIAL
HCT: 43 (ref 41–53)
Hemoglobin: 14.3 (ref 13.5–17.5)
Neutrophils Absolute: 2.75
Platelets: 151 K/uL (ref 150–400)
WBC: 4.1

## 2022-08-04 LAB — CBC: RBC: 4.68 (ref 3.87–5.11)

## 2022-08-04 NOTE — Telephone Encounter (Addendum)
I spoke to pt's wife. She states that Eduardo Hester is on his off week. He will restart the Lonsurf on the 8th. No missed doses in 1st cycle. He had occasional nausea. No emesis. Pt did not have fever, chills, mouth sores, diarrhea, chest pain, increased SOB, headache, nor skin rash. Pt was here this morning to see Dr., but I didn't get a chance to see them. I reminded her to call if he develops temp of 100.4 or higher, day or night. She verbalized understanding.

## 2022-08-07 ENCOUNTER — Telehealth: Payer: Self-pay

## 2022-08-07 NOTE — Telephone Encounter (Addendum)
I SPOKE WITH PT'S WIFE,LAURA. SHE CONFIRMED HE HAS NOT STARTED ANY OF THE LONSURF! ----- Message from Derwood Kaplan, MD sent at 08/07/2022  4:25 PM EDT ----- Pls instruct pt NOT to start back on the M[place] ----- Message ----- From: Barbee Cough, MD Sent: 08/07/2022   2:30 PM EDT To: Derwood Kaplan, MD  I spoke with Rolla Etienne just now.  She is requesting that we hold chemotherapy for a month with plans to see him back and reimage  Thanks

## 2022-08-08 ENCOUNTER — Encounter: Payer: Self-pay | Admitting: Oncology

## 2022-08-08 ENCOUNTER — Other Ambulatory Visit: Payer: Self-pay

## 2022-08-09 ENCOUNTER — Ambulatory Visit: Payer: BC Managed Care – PPO

## 2022-08-10 ENCOUNTER — Other Ambulatory Visit: Payer: Self-pay | Admitting: Oncology

## 2022-08-10 DIAGNOSIS — C187 Malignant neoplasm of sigmoid colon: Secondary | ICD-10-CM

## 2022-08-10 DIAGNOSIS — C787 Secondary malignant neoplasm of liver and intrahepatic bile duct: Secondary | ICD-10-CM

## 2022-08-10 NOTE — Progress Notes (Signed)
[facility]  [address] [place],  [place]  [address] [phone]  Clinic Day: 08/11/22  Referring physician: Noni Saupe, MD  ASSESSMENT & PLAN:  Assessment & Plan: Malignant neoplasm metastatic to liver [facility]) History of liver metastasis in January 2021 treated with resection alone.  He had recurrent liver metastasis treated with a partial hepatectomy in June of 2023.  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  There was 1 negative node but margins were close.  This was attached to the diaphragm but the tissue from the diaphragm is benign.  He had a complicated postoperative course developing an cyst requiring drainage.  He then developed a right pleural effusion in July treated with a VATS procedure. He did have postoperative complications including biliary leak and had ERCP with placement of an endoscopic stent into the biliary duct.  He was readmitted on June 27 with abscess and sepsis and perihepatic fluid collection that required placement of a drain. He is receiving chemotherapy with FOLFIRI and Avastin, which he started in August.  His third cycle of chemotherapy was delayed as he was admitted with sepsis.  He completed IV antibiotics as an outpatient. CT abdomen pelvis from 01/07/2022 showed 12 mm short axis lymph node identified in the region of the porta hepatis. His MRI abdomen in early December, 2023 at [facility] reveals nearly complete right hepatectomy without residual or recurrent metastatic disease, and no evidence of extrahepatic abdominal metastases. He has now had a PET scan which does show recurrence again, with 2 lesions in the liver so we placed him on Lonsurf with Bevacizumab which was started about 2 weeks ago. However, we want to explore any possible surgical or interventional approaches to his recurrent liver metastases, so that is on hold.  He has seen Dr. Glennis Brink and an interventional  radiologist at [facility] on April 1st and they plan to follow up later this month with repeat MRI.   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  He was treated with surgical resection followed by 6 months of adjuvant chemotherapy with FOLFOX. He had ascites and peritoneal nodules in 2018 with negative biopsy and negative fluid cytology.  He was taken for exploratory laparotomy with removal of the nodule which is found to be benign.  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  We discussed postoperative chemotherapy, but ultimately decided on surveillance. He developed another liver metastasis in June of 2023, also resected. He now has recurrent liver metastases again. His PET scan showed 2 foci of abnormal increased radiotracer uptake identified within the dome of the liver and along the posterior resection margin, which appear to be recurrent tumor, with a SUV of 6. One lesion is 1.5 cm and the other is 1.0 cm. We will get an opinion as to any local intervention could be approached.    Iron deficiency anemia due to chronic blood loss Microcytic anemia, resolved. He denies any overt form of blood loss. Iron studies were equivocal in July. His HGB today is better at 14.3 and his iron has been stopped.    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    Plan:  He informed me that they had saw a interventional radiologist and she recommended that he should stop taking his chemotherapy pills as his 2  liver lesions seen on his PET scan are too small too biopsy. She also suggested the possibility of removing the rest of the right lobe of the liver and leaving the left lobe. He is scheduled to have a MRI and follow-up appointment at [facility] done on 08/28/2022. I reminded them to not resume Avastin within at least a month after any surgical  procedure. We will keep his Lonsurf and Avastin on hold until further notice. They will call me after a decision is made about any possible procedures. His BP today is 133/94 which is slightly better than usual, I had placed him on Lisinopril 20mg  daily In February. I discussed with them other BP medication options and that we will continue Lisinopril for now and monitor his BP. They will call back and check in after MRI to discuss future appointment plans. The patient understands the plans discussed today and is in agreement with them.  He knows to contact our office if he develops concerns prior to his next appointment.    I provided 27 minutes of face-to-face time during this encounter and > 50% was spent counseling as documented under my assessment and plan.   [name], MD  [facility] AT [facility] [address] [place] [address] Dept: [phone] Dept Fax: [phone]   No orders of the defined types were placed in this encounter.   CHIEF COMPLAINT:  CC: Recurrent colon cancer  Current Treatment: FOLFIRI/bevacizumab  HISTORY OF PRESENT ILLNESS:  The patient is a 52 y.o. 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  He presented with fevers and chills in September 2017 and was found to have diverticulitis, which was treated.  CT at that time revealed a 1.7 cm lesion in the liver, which was nonspecific, as well as ectopic left kidney with a cyst.  He was then brought back for a colonoscopy, which revealed a 3.5 cm mass in the sigmoid.  Biopsy revealed high-grade dysplasia.  CT abdomen and pelvis revealed an indeterminate lesion in the liver in addition to wall thickening in the sigmoid colon.  MRI abdomen revealed the lesion in the liver to represent complex cyst versus vascular lesion, however, follow-up in 6 months was recommended.  Air contrast barium enema revealed an apple-core lesion of the sigmoid colon.   Baseline CEA was 0.6.  He underwent surgical resection in November 2017.  Pathology revealed a 4 cm, grade 2, adenocarcinoma of the sigmoid colon, as well as chronic active diverticulitis with an area of perforation, however, the tumor was not perforated.  Tumor invaded through the muscularis propria into the pericolorectal tissues.  4/21 nodes were positive for metastasis.  Margins were clear.  MMR and MSI were normal.  KRAS and BRAF mutations were negative.  He had iron deficiency treated with oral iron supplement in the form of Hemocyte daily.  Due to his age of diagnosis and family history he underwent testing for hereditary non polyposis colorectal cancer with the Myriad myRisk Hereditary Cancer Panel test.  This did not reveal any clinically significant mutation or variants of uncertain significance.  Repeat MRI abdomen in January 2018 revealed a stable lesion within the right lobe of the liver, most consistent with benign lesion.  There was new small volume ascites seen.  The patient received adjuvant chemotherapy with FOLFOX  for 12 cycles, which was completed in June.  He tolerated treatment fairly well, except for mild neuropathy with paresthesias and  numbness in his feet.  He was placed on gabapentin in July, then noticed abdominal bloating, which he attributed to the gabapentin, so he discontinued that after 1 month.  He was seen for routine follow-up in September 2018 with a repeat MRI abdomen to re-evaluate the liver lesion.  That revealed a large volume ascites with findings suspicious for peritoneal disease.  The lesion in the right lobe of the liver was stable and still assistant with a benign lesion.  The patient underwent paracentesis with removal of 4.7 L of fluid, but fluid cytology was negative for malignancy.  PET scan in early October did not reveal any hypermetabolic activity, but moderate ascites was seen.  Ultrasound-guided peritoneal biopsy was negative for malignancy, revealing adipose  tissue with fibrosis, patchy inflammation and minimal atypia.  His case was discussed at tumor conference and he was referred to Dr. Georgiana Shore for consideration open biopsy.  Dr. Georgiana Shore performed colonoscopy in October 2018, which was negative.  Follow-up colonoscopy in 3 years was recommended.  The patient then underwent exploratory laparotomy with multiple peritoneal biopsies.  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  We therefore recommended observation for the patient.  In November 2018, he was seen in the emergency room with abdominal pain, nausea and vomiting.  Gallbladder ultrasound revealed cholelithiasis with sludge and polyps, as well as mild gallbladder wall thickening.  Mild ascites was also seen.  CT abdomen and pelvis revealed mild diffuse gallbladder wall edema otherwise unchanged from PET-CT done in October.  He underwent cholecystectomy with findings of acute cholecystitis.  Pathology did not reveal any evidence of malignancy.    He was seen off schedule in April 2019, as he presented to Dr. Marnee Guarneri office with abdominal pain and was found to have an elevated bilirubin and liver transaminases.  Due to the laboratory abnormalities, he underwent repeat MRI abdomen at that time, which revealed a stable lesion in the right hepatic lobe, which was felt to be indeterminate, with a 2nd smaller indeterminate lesion in the right hepatic lobe measuring 12 mm, which was new from previous imaging.  No hepatic steatosis or ascites was seen.  CEA was normal.  Hepatitis panel and CMV were negative  we felt the laboratory abnormalities were most likely secondary to viral illness.  He was followed closely and underwent a repeat MRI abdomen in June, which remained stable. He had one again in December of 2019, which was stable.  A repeat MRI in October of 2020 revealed a stable lesion but  a new increased signal in this area with mild non masslike enhancement and possible thrombosis of adjacent branches of the portal vein, so short term follow up was recommended.  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  MRI abdomen in January 2021 revealed the mass in the junction of segments 7 and 8 in the liver has increased in size compared to the prior exam, currently 2.9 x 2.1 x 2.5 cm.  The lesion has a branching component, some of which may be from portal vein thrombus or tumor thrombus, even this branching component appears to enhance on subtraction images.   Biopsy in February revealed adenocarcinoma with necrosis, consistent with metastasis from colon primary.  He underwent surgical resection in February with Dr. Whitney Post at [facility].  We discussed the option of further chemotherapy, including the risks and benefits, and he opted for surveillance.  MRI abdomen in May 2023 revealed another solitary metastasis in the liver.  Treated with partial hepatectomy in June with Dr. Glennis Brink.  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  There was 1 negative node, but margins were close.  This was attached to the diaphragm, but the tissue from the diaphragm was benign.  He had a complicated postoperative course developing an cyst requiring drainage.  He then developed a right pleural effusion in July treated with a VATS procedure.  He is receiving chemotherapy with FOLFIRI and Avastin, which he started in August   Oncology History  Colon cancer [facility])  01/23/2016 Cancer Staging   Staging form: Colon and Rectum, AJCC 7th Edition - Clinical stage from 01/23/2016: Stage IIIB (T3, N2a, M0) - Signed by [name], MD on 05/03/2020 Staging comments: 3.5 cm MMR, MSI normal, No mutation of KRAS or BRAF, neg.genetics Rec'd 6 months adjuvant FOLFOX chemotherapy   02/02/2016 Initial Diagnosis   Colon cancer (HCC)   12/13/2021 - 12/29/2021  Chemotherapy   Patient is on Treatment Plan : COLORECTAL FOLFIRI / BEVACIZUMAB Q14D     12/13/2021 - 06/01/2022 Chemotherapy   Patient is on Treatment Plan : COLORECTAL FOLFIRI + Bevacizumab q14d     07/12/2022 -  Chemotherapy   Patient is on Treatment Plan : COLORECTAL Bevacizumab + Trifluridine/Tipiracil q28d     Malignant neoplasm metastatic to liver (HCC)  05/29/2019 Initial Diagnosis   Malignant neoplasm metastatic to liver (HCC)   12/13/2021 - 12/29/2021 Chemotherapy   Patient is on Treatment Plan : COLORECTAL FOLFIRI / BEVACIZUMAB Q14D     12/13/2021 - 06/01/2022 Chemotherapy   Patient is on Treatment Plan : COLORECTAL FOLFIRI + Bevacizumab q14d     01/07/2022 Imaging   CT abdomen and pelvis:  IMPRESSION:  1. Interval right hepatectomy with surgical changes in the dome of  the left liver.  2. 12 mm short axis lymph node identified in the region of the porta  hepatis. Attention on follow-up recommended.  3. Left pelvic kidney with large simple appearing cyst measuring  12.5 cm, increased from 7.8 cm on 03/14/2017.  4. No acute findings in the abdomen or pelvis. Specifically, no  findings to explain the patient's history of abdominal pain and  fever.    07/12/2022 -  Chemotherapy   Patient is on Treatment Plan : COLORECTAL Bevacizumab + Trifluridine/Tipiracil q28d       INTERVAL HISTORY:  Momin is here today for repeat clinical assessment for recurrent colon cancer. Patient states that he feels ok and has no complaints of pain. He informed me that they had saw a interventional radiologist and she recommended that he should stop taking his chemotherapy pills as his 2 liver lesions seen on his PET scan are too small too biopsy. She also suggested the possibility of removing the rest of the right lobe of his liver and leaving the left lobe. He is scheduled to have a MRI and follow-up appointment at [facility] done on 08/28/2022. I reminded them to not resume Avastin within at least a  month after any surgical procedure. His WBC is 3.9 with an ANC of 1950 and the rest of his labs are normal. I informed him he should be able to tolerate Lonsurf very well after this. His BP today is 133/94 which is slightly better than usual, I had placed him on Lisinopril 20mg  daily In February. I discussed with them other BP medication options and that we will  continue Lisinopril for now and monitor his BP. They will call back and check in after MRI to discuss future appointment plans. We will keep his Lonsurf and Avastin on hold until further notice. They will call me after a decision is made about any possible procedures.  He denies signs of infection such as sore throat, sinus drainage, cough, or urinary symptoms.  He denies fevers or recurrent chills. He denies pain. He denies nausea, vomiting, chest pain, dyspnea or cough. His appetite is good and his weight has been stable. He is accompanied at today's visit with his wife. Marland Kitchen   REVIEW OF SYSTEMS:  Review of Systems  Constitutional:  Positive for fatigue. Negative for appetite change, chills, diaphoresis, fever and unexpected weight change.  HENT:  Negative.  Negative for hearing loss, lump/mass, mouth sores, nosebleeds, sore throat, tinnitus, trouble swallowing and voice change.   Eyes: Negative.  Negative for eye problems and icterus.  Respiratory: Negative.  Negative for chest tightness, cough, hemoptysis, shortness of breath and wheezing.   Cardiovascular: Negative.  Negative for chest pain, leg swelling and palpitations.  Gastrointestinal: Negative.  Negative for abdominal distention, abdominal pain, blood in stool, constipation, diarrhea, nausea, rectal pain and vomiting.  Endocrine: Negative.  Negative for hot flashes.  Genitourinary: Negative.  Negative for bladder incontinence, difficulty urinating, dyspareunia, dysuria, frequency, hematuria, nocturia, pelvic pain and penile discharge.   Musculoskeletal: Negative.  Negative for  arthralgias, back pain, flank pain, gait problem, myalgias, neck pain and neck stiffness.  Skin: Negative.  Negative for itching, rash and wound.  Neurological:  Positive for numbness (in his feet). Negative for dizziness, extremity weakness, gait problem, headaches, light-headedness, seizures and speech difficulty.  Hematological: Negative.  Negative for adenopathy. Does not bruise/bleed easily.  Psychiatric/Behavioral: Negative.  Negative for confusion, decreased concentration, depression, sleep disturbance and suicidal ideas. The patient is not nervous/anxious.      VITALS:  Blood pressure (!) 133/94, pulse 75, temperature 98.1 F (36.7 C), temperature source Oral, resp. rate 18, height 6\' 6"  (1.981 m), weight 279 lb 9.6 oz (126.8 kg), SpO2 100 %.  Wt Readings from Last 3 Encounters:  08/11/22 279 lb 9.6 oz (126.8 kg)  08/04/22 279 lb 9.6 oz (126.8 kg)  07/26/22 280 lb (127 kg)    Body mass index is 32.31 kg/m.  Performance status (ECOG): 1 - Symptomatic but completely ambulatory  PHYSICAL EXAM:  Physical Exam Vitals and nursing note reviewed. Exam conducted with a chaperone present.  Constitutional:      General: He is not in acute distress.    Appearance: Normal appearance. He is normal weight. He is not ill-appearing, toxic-appearing or diaphoretic.  HENT:     Head: Normocephalic and atraumatic.     Right Ear: Tympanic membrane, ear canal and external ear normal. There is no impacted cerumen.     Left Ear: Tympanic membrane, ear canal and external ear normal. There is no impacted cerumen.     Nose: Nose normal. No congestion or rhinorrhea.     Mouth/Throat:     Mouth: Mucous membranes are moist.     Pharynx: Oropharynx is clear. No oropharyngeal exudate or posterior oropharyngeal erythema.  Eyes:     General: No scleral icterus.       Right eye: No discharge.        Left eye: No discharge.     Extraocular Movements: Extraocular movements intact.     Conjunctiva/sclera:  Conjunctivae normal.     Pupils: Pupils are equal, round,  and reactive to light.  Neck:     Vascular: No carotid bruit.  Cardiovascular:     Rate and Rhythm: Normal rate and regular rhythm.     Pulses: Normal pulses.     Heart sounds: Normal heart sounds. No murmur heard.    No friction rub. No gallop.  Pulmonary:     Effort: Pulmonary effort is normal. No respiratory distress.     Breath sounds: No stridor. Examination of the right-lower field reveals decreased breath sounds. Decreased breath sounds present. No wheezing, rhonchi or rales.  Chest:     Chest wall: No tenderness.  Abdominal:     General: Bowel sounds are normal. There is no distension.     Palpations: Abdomen is soft. There is no hepatomegaly, splenomegaly or mass.     Tenderness: There is no abdominal tenderness. There is no right CVA tenderness, left CVA tenderness, guarding or rebound.     Hernia: No hernia is present.     Comments: Firm liver edge right at the right costal margin    Genitourinary:    Rectum: Guaiac result negative.  Musculoskeletal:        General: No swelling, tenderness, deformity or signs of injury. Normal range of motion.     Cervical back: Normal range of motion and neck supple. No rigidity or tenderness.     Right lower leg: No edema.     Left lower leg: No edema.  Lymphadenopathy:     Cervical: No cervical adenopathy.     Upper Body:     Right upper body: No supraclavicular or axillary adenopathy.     Left upper body: No supraclavicular or axillary adenopathy.     Lower Body: No right inguinal adenopathy. No left inguinal adenopathy.  Skin:    General: Skin is warm and dry.     Coloration: Skin is not jaundiced or pale.     Findings: No bruising, erythema, lesion or rash.  Neurological:     General: No focal deficit present.     Mental Status: He is alert and oriented to person, place, and time. Mental status is at baseline.     Cranial Nerves: No cranial nerve deficit.     Sensory:  No sensory deficit.     Motor: No weakness.     Coordination: Coordination normal.     Gait: Gait normal.     Deep Tendon Reflexes: Reflexes normal.  Psychiatric:        Mood and Affect: Mood normal.        Behavior: Behavior normal.        Thought Content: Thought content normal.        Judgment: Judgment normal.    LABS:      Latest Ref Rng & Units 08/11/2022   12:00 AM 08/04/2022   12:00 AM 07/24/2022    3:12 PM  CBC  WBC  3.9     4.1     6.5   Hemoglobin 13.5 - 17.5 14.3     14.3     14.5   Hematocrit 41 - 53 43     43     43.7   Platelets 150 - 400 K/uL 195     151     205      This result is from an external source.      Latest Ref Rng & Units 08/11/2022   12:00 AM 07/12/2022    4:05 PM 06/23/2022   12:00 AM  CMP  Glucose 70 - 99 mg/dL  84    BUN 4 - 21 14     15  12       Creatinine 0.6 - 1.3 1.0     0.99  1.0      Sodium 137 - 147 139     138  138      Potassium 3.5 - 5.1 mEq/L 4.5     4.7  4.4      Chloride 99 - 108 105     105  105      CO2 13 - 22 25     26  26       Calcium 8.7 - 10.7 9.2     9.2  9.1      Total Protein 6.5 - 8.1 g/dL  6.8    Total Bilirubin 0.3 - 1.2 mg/dL  0.9    Alkaline Phos 25 - 125 84     68  77      AST 14 - 40 31     24  30       ALT 10 - 40 U/L 31     19  21          This result is from an external source.   Lab Results  Component Value Date   CEA1 1.1 06/23/2022   /  CEA  Date Value Ref Range Status  06/23/2022 1.1 0.0 - 4.7 ng/mL Final    Comment:    (NOTE)                             Nonsmokers          <3.9                             Smokers             <5.6 [employer] Electrochemiluminescence Immunoassay (ECLIA) Values obtained with different assay methods or kits cannot be used interchangeably.  Results cannot be interpreted as absolute evidence of the presence or absence of malignant disease. Performed At: [facility] [address] [place], [place] [id number] Jolene Schimke MD WU:[id number]          Component Ref Range & Units 2 d ago 8 mo ago  Glucose-Capillary 70 - 99 mg/dL 956 High  213 High  CM     No results found for: "PSA1" No results found for: "YQM578" No results found for: "CAN125"  No results found for: "TOTALPROTELP", "ALBUMINELP", "A1GS", "A2GS", "BETS", "BETA2SER", "GAMS", "MSPIKE", "SPEI" Lab Results  Component Value Date   TIBC 225 (L) 11/22/2021   FERRITIN 292 11/22/2021   IRONPCTSAT 6 (L) 11/22/2021   No results found for: "LDH"  STUDIES:  EXAM: 06/21/2022 NUCLEAR MEDICINE PET SKULL BASE TO THIGH  IMPRESSION: 1. Status post right hepatectomy. There are 2 foci of abnormal increased radiotracer uptake identified within the dome of the liver and along the posterior resection margin, which are worrisome for recurrent tumor. 2. There is a new, nonspecific subpleural nodular density within the base of the lingula which has an SUV max of 3.20. This is favored to represent an area of focal inflammation or infection. Metastatic disease is considered less favored but not excluded. Attention on future surveillance imaging advised.  EXAM: 04/12/2022 MRI ABDOMEN WITHOUT AND WITH CONTRAST  IMPRESSION: 1. Near complete right hepatectomy, without residual/recurrent or new metastatic disease. 2. No  evidence of extrahepatic abdominal metastasis. 3.  Aortic Atherosclerosis (ICD10-I70.0).  CT abdomen and pelvis 01/07/2022 IMPRESSION:  1. Interval right hepatectomy with surgical changes in the dome of  the left liver.  2. 12 mm short axis lymph node identified in the region of the porta  hepatis. Attention on follow-up recommended.  3. Left pelvic kidney with large simple appearing cyst measuring  12.5 cm, increased from 7.8 cm on 03/14/2017.  4. No acute findings in the abdomen or pelvis. Specifically, no  findings to explain the patient's history of abdominal pain and  fever.   NUCLEAR MEDICINE PET SKULL BASE TO THIGH 10/07/2021 TECHNIQUE: 14.0 mCi F-18 FDG was  injected intravenously. Full-ring PET imaging was performed from the skull base to thigh after the radiotracer. CT data was obtained and used for attenuation correction and anatomic localization. Fasting blood glucose: 110 mg/dl COMPARISON:  MRI 16/02/9603 and PET-CT 05/30/2019. Calcified granuloma identified in the right middle lobe, image 44/7. FINDINGS: Mediastinal blood pool activity: SUV max 2.86 Liver activity: SUV max NA NECK: No hypermetabolic lymph nodes in the neck. Incidental CT findings: none CHEST: No hypermetabolic mediastinal or hilar nodes. No suspicious pulmonary nodules on the CT scan. Incidental CT findings: none ABDOMEN/PELVIS: Tracer avid lesion within the dome of liver has an SUV max of 6.41, image 107 of the fused PET-CT images. This is not visible on the corresponding unenhanced CT images. On the PET-CT from 05/30/2019 this had an SUV max of 4.3. On the recent MRI of the abdomen this measured 2.2 x 2.5 x 2.7 cm. No additional abnormal foci of increased radiotracer uptake within the liver. No abnormal uptake identified within the pancreas, spleen or adrenal glands. No tracer avid right abdominopelvic lymph nodes. Anastomotic suture line is identified at the rectosigmoid junction. No signs of locally recurrent tumor. Incidental CT findings: Left pelvic kidney with large Bosniak class 1 exophytic cyst is again noted. No follow-up recommended. SKELETON: No focal hypermetabolic activity to suggest skeletal metastasis. Incidental CT findings: none IMPRESSION: 1. Increased radiotracer uptake is identified within the dome of liver corresponding to the enhancing liver lesion on recent MRI of the abdomen. 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 2. No additional sites of FDG avid disease.   MRI ABDOMEN WITHOUT AND WITH CONTRAST 09/29/2021 TECHNIQUE: Multiplanar multisequence MR imaging of the abdomen was performed both before and after the  administration of intravenous contrast. CONTRAST:  10mL GADAVIST GADOBUTROL 1 MMOL/ML IV SOLN COMPARISON:  Abdominal MRI 12/30/2020. FINDINGS: Lower chest: Unremarkable Hepatobiliary: Mild diffuse loss of signal intensity throughout the hepatic parenchyma, indicative of a background of mild hepatic steatosis. Again noted are areas of susceptibility artifact in the superior aspect of the right lobe of the liver related to prior partial hepatectomy. 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 No other definite suspicious hepatic lesions are noted. No intra or extrahepatic biliary ductal dilatation. Status post cholecystectomy. Common bile duct measures 4 mm in the porta hepatis. Pancreas: No pancreatic mass. No pancreatic ductal dilatation. No pancreatic or peripancreatic fluid collections or inflammatory changes. Spleen:  Unremarkable. Adrenals/Urinary Tract: Left kidney not visualized (previous imaging demonstrates a left pelvic kidney). Right kidney and bilateral adrenal glands are normal  in appearance. No right hydroureteronephrosis in the visualized portions of the abdomen. Stomach/Bowel: Visualized portions are unremarkable. Vascular/Lymphatic: No aneurysm identified in the visualized abdominal vasculature. No lymphadenopathy noted in the abdomen. Other: No significant volume of ascites noted in the visualized portions of the peritoneal cavity. Musculoskeletal: No aggressive appearing osseous lesions are noted in the visualized portions of the skeleton.   IMPRESSION: 1. 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 No new sites of metastatic disease are otherwise noted.   These results will be called to the ordering clinician or representative by the Radiologist Assistant, and communication documented in the PACS or [employer].   HISTORY:   Past Medical History:  Diagnosis Date   GERD (gastroesophageal reflux disease)    Renal lithiasis     Past Surgical History:  Procedure Laterality Date   LIVER RESECTION Right 07/02/2019   LIVER RESECTION Right 10/12/2021   SIGMOIDECTOMY  03/2016    Family History  Problem Relation Age of Onset   Lung cancer Father 79   Leukemia Father 66       acute myelogenous   Colon cancer Sister 51    Social History:  reports that he has quit smoking. He has never used smokeless tobacco. He reports that he does not currently use alcohol. No history on file for drug use.The patient is accompanied by his wife today.  Allergies: No Known Allergies  Current Medications: Current Outpatient Medications  Medication Sig Dispense Refill   ascorbic acid (VITAMIN C) 1000 MG tablet Take by mouth.     Calcium Polycarbophil (FIBER) 625 MG TABS Take by mouth.     cetirizine (ZYRTEC) 10 MG tablet Take by mouth.     Cholecalciferol (VITAMIN D) 50 MCG (2000 UT) tablet Take by mouth.     Docusate Sodium (DSS) 100 MG CAPS Take 1 capsule by mouth daily.     Elderberry 500 MG CAPS Take 1 tablet by mouth daily.     hydrocortisone (ANUSOL-HC) 25 MG suppository Place 1 suppository (25 mg total) rectally 2 (two) times daily. 12 suppository 1   lisinopril (ZESTRIL) 20 MG tablet Take 1 tablet (20 mg total) by mouth daily. 30 tablet 5   LONSURF 20-8.19 MG tablet TAKE 4 TABLETS BY MOUTH 2 TIMES DAILY ON DAYS 1-5 AND 8-12 OF EACH 28 DAY CYCLE. TAKE 1 HOUR AFTER MORNING AND EVENING MEALS. 80 tablet 0   loperamide (IMODIUM) 2 MG capsule Take 1 capsule (2 mg total) by mouth as needed for diarrhea or loose stools. Take 2 at diarrhea onset , then 1  every 2hr until 12hrs with no BM. May take 2 every 4hrs at night. If diarrhea recurs repeat. 100 capsule 5   LORazepam (ATIVAN) 1 MG tablet Take 1 tablet (1 mg total) by mouth every 6 (six) hours as needed for anxiety or sleep. 100 tablet 0   magic mouthwash (nystatin, lidocaine, diphenhydrAMINE, alum & mag hydroxide) suspension SWISH AND/OR SWALLOW 5 ML BY MOUTH EVERY THREE HOURS IF NEEDED     Misc Natural Products (ELDERBERRY IMMUNE COMPLEX) CHEW Chew 1 Dose by mouth daily.     Multiple Vitamin (MULTI-VITAMIN) tablet Take 1 tablet by mouth daily.     omeprazole (PRILOSEC) 20 MG capsule Take by mouth.     ondansetron (ZOFRAN) 8 MG tablet Take 1 tablet (8 mg total) by mouth 2 (two) times daily as needed for refractory nausea / vomiting.  Start on day 3 after chemotherapy. 30 tablet 1   ondansetron (ZOFRAN-ODT) 4 MG disintegrating tablet Take 4 mg by mouth every 8 (eight) hours as needed. (Patient not taking: Reported on 03/02/2022)     oxyCODONE (OXY IR/ROXICODONE) 5 MG immediate release tablet Take by mouth.     Probiotic Product (PROBIOTIC BLEND PO) Take 1 tablet by mouth daily. (Patient not taking: Reported on 08/04/2022)     prochlorperazine (COMPAZINE) 10 MG tablet Take 1 tablet (10 mg total) by mouth every 6 (six) hours as needed (NAUSEA). (Patient not taking: Reported on 03/02/2022) 30 tablet 1   vitamin B-12 (CYANOCOBALAMIN) 250 MCG tablet Take by mouth.     zinc gluconate 50 MG tablet Take 50 mg by mouth daily.     No current facility-administered medications for this visit.   Facility-Administered Medications Ordered in Other Visits  Medication Dose Route Frequency Provider Last Rate Last Admin   atropine 1 MG/ML injection            palonosetron (ALOXI) 0.25 MG/5ML injection             I,Jasmine M Lassiter,acting as a scribe for [name], MD.,have documented all relevant documentation on the behalf of [name], MD,as directed by  [name], MD while in  the presence of [name], MD.

## 2022-08-11 ENCOUNTER — Inpatient Hospital Stay (HOSPITAL_BASED_OUTPATIENT_CLINIC_OR_DEPARTMENT_OTHER): Payer: BC Managed Care – PPO | Admitting: Oncology

## 2022-08-11 ENCOUNTER — Inpatient Hospital Stay: Payer: BC Managed Care – PPO | Attending: Oncology

## 2022-08-11 ENCOUNTER — Other Ambulatory Visit: Payer: Self-pay

## 2022-08-11 ENCOUNTER — Encounter: Payer: Self-pay | Admitting: Oncology

## 2022-08-11 VITALS — BP 133/94 | HR 75 | Temp 98.1°F | Resp 18 | Ht 78.0 in | Wt 279.6 lb

## 2022-08-11 DIAGNOSIS — C187 Malignant neoplasm of sigmoid colon: Secondary | ICD-10-CM

## 2022-08-11 DIAGNOSIS — C787 Secondary malignant neoplasm of liver and intrahepatic bile duct: Secondary | ICD-10-CM

## 2022-08-11 LAB — BASIC METABOLIC PANEL
BUN: 14 (ref 4–21)
CO2: 25 — AB (ref 13–22)
Chloride: 105 (ref 99–108)
Creatinine: 1 (ref 0.6–1.3)
EGFR: 60
Glucose: 130
Potassium: 4.5 mEq/L (ref 3.5–5.1)
Sodium: 139 (ref 137–147)

## 2022-08-11 LAB — CBC: RBC: 4.7 (ref 3.87–5.11)

## 2022-08-11 LAB — HEPATIC FUNCTION PANEL
ALT: 31 U/L (ref 10–40)
AST: 31 (ref 14–40)
Alkaline Phosphatase: 84 (ref 25–125)
Bilirubin, Total: 0.6

## 2022-08-11 LAB — CBC AND DIFFERENTIAL
HCT: 43 (ref 41–53)
Hemoglobin: 14.3 (ref 13.5–17.5)
Neutrophils Absolute: 1.95
Platelets: 195 10*3/uL (ref 150–400)
WBC: 3.9

## 2022-08-11 LAB — COMPREHENSIVE METABOLIC PANEL
Albumin: 4.1 (ref 3.5–5.0)
Calcium: 9.2 (ref 8.7–10.7)

## 2022-08-14 ENCOUNTER — Other Ambulatory Visit: Payer: Self-pay | Admitting: Pharmacist

## 2022-08-14 DIAGNOSIS — C187 Malignant neoplasm of sigmoid colon: Secondary | ICD-10-CM

## 2022-08-14 DIAGNOSIS — C787 Secondary malignant neoplasm of liver and intrahepatic bile duct: Secondary | ICD-10-CM

## 2022-08-24 ENCOUNTER — Encounter: Payer: Self-pay | Admitting: Oncology

## 2022-08-27 ENCOUNTER — Encounter: Payer: Self-pay | Admitting: Oncology

## 2022-09-01 ENCOUNTER — Encounter: Payer: Self-pay | Admitting: Oncology

## 2022-09-27 ENCOUNTER — Encounter: Payer: Self-pay | Admitting: Oncology

## 2022-09-28 ENCOUNTER — Other Ambulatory Visit: Payer: Self-pay | Admitting: Oncology

## 2022-09-28 ENCOUNTER — Telehealth: Payer: Self-pay | Admitting: Oncology

## 2022-09-28 DIAGNOSIS — C787 Secondary malignant neoplasm of liver and intrahepatic bile duct: Secondary | ICD-10-CM

## 2022-09-28 NOTE — Telephone Encounter (Signed)
Patient has been scheduled. Aware of appt date and time.    RE: Infusion Orders Received: Jodi Mourning, MD sent to [facility]; Jeannette Corpus, LPN; Thomasena Edis It looks like [facility] has him down for scans and appt 7/29 & then will make decisions.  Ask him/wife whether they would want me to see him for check up and labs sometime in June.       Previous Messages    ----- Message ----- From: Otilio Miu Sent: 09/25/2022   6:28 PM EDT To: [name], MD; Jeannette Corpus, LPN; * Subject: Infusion Orders                                Any updates on Mr. Bonsell, or are we still holding off on scheduling his infusions?

## 2022-10-05 NOTE — Progress Notes (Signed)
[facility]  [address] [place],  [place]  [address] [phone]  Clinic Day: 10/11/22  Referring physician: Noni Saupe, MD  ASSESSMENT & PLAN:  Assessment & Plan: Malignant neoplasm metastatic to liver [facility]) History of liver metastasis in January 2021 treated with resection alone.  He had recurrent liver metastasis treated with a partial hepatectomy in June of 2023.  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  There was 1 negative node but margins were close.  This was attached to the diaphragm but the tissue from the diaphragm is benign.  He had a complicated postoperative course developing an cyst requiring drainage.  He then developed a right pleural effusion in July treated with a VATS procedure. He did have postoperative complications including biliary leak and had ERCP with placement of an endoscopic stent into the biliary duct.  He was readmitted on June 27 with abscess and sepsis and perihepatic fluid collection that required placement of a drain. He is receiving chemotherapy with FOLFIRI and Avastin, which he started in August.  His third cycle of chemotherapy was delayed as he was admitted with sepsis.  He completed IV antibiotics as an outpatient. CT abdomen pelvis from 01/07/2022 showed 12 mm short axis lymph node identified in the region of the porta hepatis. His MRI abdomen in early December, 2023 at [facility] reveals nearly complete right hepatectomy without residual or recurrent metastatic disease, and no evidence of extrahepatic abdominal metastases. He has now had a PET scan which does show recurrence again, with 2 lesions in the liver so we placed him on Lonsurf with Bevacizumab which was started about 2 weeks ago. However, we want to explore any possible surgical or interventional approaches to his recurrent liver metastases, so that is on hold.  He has seen Dr. Glennis Brink and an interventional  radiologist at [facility] on April 1st and they repeated the MRI. At this time the plan is to repeat this again at the end of July before making any decisions. He will remain off treatment.   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  He was treated with surgical resection followed by 6 months of adjuvant chemotherapy with FOLFOX. He had ascites and peritoneal nodules in 2018 with negative biopsy and negative fluid cytology.  He was taken for exploratory laparotomy with removal of the nodule which is found to be benign.  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  We discussed postoperative chemotherapy, but ultimately decided on surveillance. He developed another liver metastasis in June of 2023, also resected. He now has recurrent liver metastases again. His PET scan showed 2 foci of abnormal increased radiotracer uptake identified within the dome of the liver and along the posterior resection margin, which appear to be recurrent tumor, with a SUV of 6. One lesion is 1.5 cm and the other is 1.0 cm. We referred him to [facility] to see if any local intervention could be approached.    Iron deficiency anemia due to chronic blood loss Microcytic anemia, resolved. He denies any overt form of blood loss. Iron studies were equivocal in July. His HGB normalized and his iron has been stopped.    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   Hypertension This is still not controlled despite placing him on Lisinopril. I will  refill that.   Plan:  He informed me that they had saw a interventional radiologist in April, and she recommended that he should stop taking his chemotherapy pills as his 2 liver lesions seen on his PET scan are too small too biopsy. She also suggested the possibility of removing the rest of the right lobe of the liver and leaving the  left lobe. He had an MRI in April which was stable, and they recommended observation until it is repeated at the end of July. We will keep his Lonsurf and Avastin on hold until further notice. They will call me after a decision is made about any possible procedures. His BP today is 133/102, which is slightly worse than usual, even though I had placed him on Lisinopril 20mg  daily In February. Repeat was still high at 122/98.  I discussed other BP medication options and that we will continue Lisinopril for now and monitor his BP. They will call back and check in after MRI to discuss future appointment plans. The patient understands the plans discussed today and is in agreement with them.  He knows to contact our office if he develops concerns prior to his next appointment. His labs are normal, including CEA and hemoglobin of 15.5. I will see him back when needed.   I provided 27 minutes of face-to-face time during this encounter and > 50% was spent counseling as documented under my assessment and plan.   [facility] AT [facility] [address] [place] [address] Dept: [phone] Dept Fax: [phone]   No orders of the defined types were placed in this encounter.   CHIEF COMPLAINT:  CC: Recurrent colon cancer  Current Treatment: FOLFIRI/bevacizumab  HISTORY OF PRESENT ILLNESS:  The patient is a 52 y.o. 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  He presented with fevers and chills in September 2017 and was found to have diverticulitis, which was treated.  CT at that time revealed a 1.7 cm lesion in the liver, which was nonspecific, as well as ectopic left kidney with a cyst.  He was then brought back for a colonoscopy, which revealed a 3.5 cm mass in the sigmoid.  Biopsy revealed high-grade dysplasia.  CT abdomen and pelvis revealed an indeterminate lesion in the liver in addition to wall thickening in the  sigmoid colon.  MRI abdomen revealed the lesion in the liver to represent complex cyst versus vascular lesion, however, follow-up in 6 months was recommended.  Air contrast barium enema revealed an apple-core lesion of the sigmoid colon.  Baseline CEA was 0.6.  He underwent surgical resection in November 2017.  Pathology revealed a 4 cm, grade 2, adenocarcinoma of the sigmoid colon, as well as chronic active diverticulitis with an area of perforation, however, the tumor was not perforated.  Tumor invaded through the muscularis propria into the pericolorectal tissues.  4/21 nodes were positive for metastasis.  Margins were clear.  MMR and MSI were normal.  KRAS and BRAF mutations were negative.  He had iron deficiency treated with oral iron supplement in the form of Hemocyte daily.  Due to his age of diagnosis and family history he underwent testing for hereditary non polyposis colorectal cancer with the Myriad myRisk Hereditary Cancer Panel test.  This did not reveal any clinically significant mutation or variants of uncertain significance.  Repeat MRI abdomen in January 2018 revealed a stable lesion within the right lobe of the liver,  most consistent with benign lesion.  There was new small volume ascites seen.  The patient received adjuvant chemotherapy with FOLFOX  for 12 cycles, which was completed in June.  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  He was placed on gabapentin in July, then noticed abdominal bloating, which he attributed to the gabapentin, so he discontinued that after 1 month.  He was seen for routine follow-up in September 2018 with a repeat MRI abdomen to re-evaluate the liver lesion.  That revealed a large volume ascites with findings suspicious for peritoneal disease.  The lesion in the right lobe of the liver was stable and still assistant with a benign lesion.  The patient underwent paracentesis with removal of 4.7 L of  fluid, but fluid cytology was negative for malignancy.  PET scan in early October did not reveal any hypermetabolic activity, but moderate ascites was seen.  Ultrasound-guided peritoneal biopsy was negative for malignancy, revealing adipose tissue with fibrosis, patchy inflammation and minimal atypia.  His case was discussed at tumor conference and he was referred to Dr. Georgiana Shore for consideration open biopsy.  Dr. Georgiana Shore performed colonoscopy in October 2018, which was negative.  Follow-up colonoscopy in 3 years was recommended.  The patient then underwent exploratory laparotomy with multiple peritoneal biopsies.  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  We therefore recommended observation for the patient.  In November 2018, he was seen in the emergency room with abdominal pain, nausea and vomiting.  Gallbladder ultrasound revealed cholelithiasis with sludge and polyps, as well as mild gallbladder wall thickening.  Mild ascites was also seen.  CT abdomen and pelvis revealed mild diffuse gallbladder wall edema otherwise unchanged from PET-CT done in October.  He underwent cholecystectomy with findings of acute cholecystitis.  Pathology did not reveal any evidence of malignancy.    He was seen off schedule in April 2019, as he presented to Dr. Marnee Guarneri office with abdominal pain and was found to have an elevated bilirubin and liver transaminases.  Due to the laboratory abnormalities, he underwent repeat MRI abdomen at that time, which revealed a stable lesion in the right hepatic lobe, which was felt to be indeterminate, with a 2nd smaller indeterminate lesion in the right hepatic lobe measuring 12 mm, which was new from previous imaging.  No hepatic steatosis or ascites was seen.  CEA was normal.  Hepatitis panel and CMV were negative  we felt the laboratory abnormalities were most  likely secondary to viral illness.  He was followed closely and underwent a repeat MRI abdomen in June, which remained stable. He had one again in December of 2019, which was stable.  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  MRI abdomen in January 2021 revealed the mass in the junction of segments 7 and 8 in the liver has increased in size compared to the prior exam, currently 2.9 x 2.1 x 2.5 cm.  The lesion has a branching component, some of which may be from portal vein thrombus or tumor thrombus, even this branching component appears to enhance on subtraction images.   Biopsy in February  revealed adenocarcinoma with necrosis, consistent with metastasis from colon primary.  He underwent surgical resection in February with Dr. Whitney Post at [facility].  We discussed the option of further chemotherapy, including the risks and benefits, and he opted for surveillance.  MRI abdomen in May 2023 revealed another solitary metastasis in the liver.  Treated with partial hepatectomy in June with Dr. Glennis Brink.  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  There was 1 negative node, but margins were close.  This was attached to the diaphragm, but the tissue from the diaphragm was benign.  He had a complicated postoperative course developing an cyst requiring drainage.  He then developed a right pleural effusion in July treated with a VATS procedure.  He is receiving chemotherapy with FOLFIRI and Avastin, which he started in August   Oncology History  Colon cancer [facility])  01/23/2016 Cancer Staging   Staging form: Colon and Rectum, AJCC 7th Edition - Clinical stage from 01/23/2016: Stage IIIB (T3, N2a, M0) - Signed by [name], MD on 05/03/2020 Staging comments: 3.5 cm MMR, MSI normal, No mutation of KRAS or BRAF, neg.genetics Rec'd 6 months adjuvant FOLFOX chemotherapy   02/02/2016 Initial Diagnosis   Colon cancer (HCC)   12/13/2021 - 12/29/2021 Chemotherapy   Patient is on Treatment Plan : COLORECTAL FOLFIRI / BEVACIZUMAB Q14D     12/13/2021 - 06/01/2022 Chemotherapy   Patient is on Treatment Plan : COLORECTAL FOLFIRI + Bevacizumab q14d     07/12/2022 - 07/26/2022 Chemotherapy   Patient is on Treatment Plan : COLORECTAL Bevacizumab + Trifluridine/Tipiracil q28d     Malignant neoplasm metastatic to liver (HCC)  05/29/2019 Initial Diagnosis   Malignant neoplasm metastatic to liver (HCC)   12/13/2021 - 12/29/2021 Chemotherapy   Patient is on Treatment Plan : COLORECTAL FOLFIRI / BEVACIZUMAB Q14D     12/13/2021 - 06/01/2022 Chemotherapy   Patient is on Treatment Plan : COLORECTAL FOLFIRI + Bevacizumab q14d     01/07/2022 Imaging   CT abdomen and pelvis:  IMPRESSION:  1. Interval right hepatectomy with surgical changes in the dome of  the left liver.  2. 12 mm short axis lymph node identified in the region of the porta  hepatis. Attention on follow-up recommended.  3. Left pelvic kidney with large simple appearing cyst measuring  12.5 cm, increased from 7.8 cm on 03/14/2017.  4. No acute findings in the abdomen or pelvis. Specifically, no  findings to explain the patient's history of abdominal pain and  fever.    07/12/2022 - 07/26/2022 Chemotherapy   Patient is on Treatment Plan : COLORECTAL Bevacizumab + Trifluridine/Tipiracil q28d       INTERVAL HISTORY:  Eduardo Hester is here today for repeat clinical assessment for recurrent colon cancer. Patient states that he feels well and denies any changes, other than decreased hearing in his right ear. He informed me that they had saw a interventional radiologist in April, and she recommended that he should stop taking his chemotherapy pills as his 2 liver lesions seen  on his PET scan are too small too biopsy. She also suggested the possibility of removing the rest of the right lobe of the liver and leaving the left lobe. He had an MRI in April which was stable, and they recommended observation until it is repeated at the end of July. We will keep his Lonsurf and Avastin on hold until further notice. They will call me after a decision is made about any  possible procedures. His BP today is 133/102, which is slightly worse than usual, even though I had placed him on Lisinopril 20mg  daily In February. Repeat at the end of the appointment was a little better at 122/98. I discussed with them other BP medication options and that we will continue Lisinopril for now and monitor his BP. They will call back and check in after MRI to discuss future appointment plans. The patient understands the plans discussed today and is in agreement with them.  He knows to contact our office if he develops concerns prior to his next appointment. His labs are normal, including CEA and hemoglobin of 15.5. I will see him back when needed. He denies signs of infection such as sore throat, sinus drainage, cough, or urinary symptoms.  He denies fevers or recurrent chills. He denies pain. He denies nausea, vomiting, chest pain, dyspnea or cough. His appetite is good and his weight has increased 3 pounds over last 2 months . He is accompanied at today's visit with his wife.   REVIEW OF SYSTEMS:  Review of Systems  Constitutional:  Positive for fatigue. Negative for appetite change, chills, diaphoresis, fever and unexpected weight change.  HENT:  Negative.  Negative for hearing loss, lump/mass, mouth sores, nosebleeds, sore throat, tinnitus, trouble swallowing and voice change.   Eyes: Negative.  Negative for eye problems and icterus.  Respiratory: Negative.  Negative for chest tightness, cough, hemoptysis, shortness of breath and wheezing.   Cardiovascular: Negative.  Negative for chest pain, leg swelling  and palpitations.  Gastrointestinal: Negative.  Negative for abdominal distention, abdominal pain, blood in stool, constipation, diarrhea, nausea, rectal pain and vomiting.  Endocrine: Negative.  Negative for hot flashes.  Genitourinary: Negative.  Negative for bladder incontinence, difficulty urinating, dyspareunia, dysuria, frequency, hematuria, nocturia, pelvic pain and penile discharge.   Musculoskeletal: Negative.  Negative for arthralgias, back pain, flank pain, gait problem, myalgias, neck pain and neck stiffness.  Skin: Negative.  Negative for itching, rash and wound.  Neurological:  Positive for numbness (in his feet). Negative for dizziness, extremity weakness, gait problem, headaches, light-headedness, seizures and speech difficulty.  Hematological: Negative.  Negative for adenopathy. Does not bruise/bleed easily.  Psychiatric/Behavioral: Negative.  Negative for confusion, decreased concentration, depression, sleep disturbance and suicidal ideas. The patient is not nervous/anxious.      VITALS:  There were no vitals taken for this visit.  Wt Readings from Last 3 Encounters:  08/11/22 279 lb 9.6 oz (126.8 kg)  08/04/22 279 lb 9.6 oz (126.8 kg)  07/26/22 280 lb (127 kg)    There is no height or weight on file to calculate BMI.  Performance status (ECOG): 1 - Symptomatic but completely ambulatory  PHYSICAL EXAM:  Physical Exam Vitals and nursing note reviewed. Exam conducted with a chaperone present.  Constitutional:      General: He is not in acute distress.    Appearance: Normal appearance. He is normal weight. He is not ill-appearing, toxic-appearing or diaphoretic.  HENT:     Head: Normocephalic and atraumatic.     Right Ear: Tympanic membrane, ear canal and external ear normal. There is no impacted cerumen.     Left Ear: Tympanic membrane, ear canal and external ear normal. There is no impacted cerumen.     Nose: Nose normal. No congestion or rhinorrhea.      Mouth/Throat:     Mouth: Mucous membranes are moist.     Pharynx: Oropharynx is clear. No oropharyngeal exudate or posterior oropharyngeal erythema.  Eyes:     General: No scleral icterus.       Right eye: No discharge.        Left eye: No discharge.     Extraocular Movements: Extraocular movements intact.     Conjunctiva/sclera: Conjunctivae normal.     Pupils: Pupils are equal, round, and reactive to light.  Neck:     Vascular: No carotid bruit.  Cardiovascular:     Rate and Rhythm: Normal rate and regular rhythm.     Pulses: Normal pulses.     Heart sounds: Normal heart sounds. No murmur heard.    No friction rub. No gallop.  Pulmonary:     Effort: Pulmonary effort is normal. No respiratory distress.     Breath sounds: No stridor. Examination of the right-lower field reveals decreased breath sounds. Decreased breath sounds present. No wheezing, rhonchi or rales.  Chest:     Chest wall: No tenderness.  Abdominal:     General: Bowel sounds are normal. There is no distension.     Palpations: Abdomen is soft. There is no hepatomegaly, splenomegaly or mass.     Tenderness: There is no abdominal tenderness. There is no right CVA tenderness, left CVA tenderness, guarding or rebound.     Hernia: No hernia is present.     Comments: Firm liver edge right at the right costal margin    Genitourinary:    Rectum: Guaiac result negative.  Musculoskeletal:        General: No swelling, tenderness, deformity or signs of injury. Normal range of motion.     Cervical back: Normal range of motion and neck supple. No rigidity or tenderness.     Right lower leg: No edema.     Left lower leg: No edema.  Lymphadenopathy:     Cervical: No cervical adenopathy.     Upper Body:     Right upper body: No supraclavicular or axillary adenopathy.     Left upper body: No supraclavicular or axillary adenopathy.     Lower Body: No right inguinal adenopathy. No left inguinal adenopathy.  Skin:    General:  Skin is warm and dry.     Coloration: Skin is not jaundiced or pale.     Findings: No bruising, erythema, lesion or rash.  Neurological:     General: No focal deficit present.     Mental Status: He is alert and oriented to person, place, and time. Mental status is at baseline.     Cranial Nerves: No cranial nerve deficit.     Sensory: No sensory deficit.     Motor: No weakness.     Coordination: Coordination normal.     Gait: Gait normal.     Deep Tendon Reflexes: Reflexes normal.  Psychiatric:        Mood and Affect: Mood normal.        Behavior: Behavior normal.        Thought Content: Thought content normal.        Judgment: Judgment normal.    LABS:      Latest Ref Rng & Units 08/11/2022   12:00 AM 08/04/2022   12:00 AM 07/24/2022    3:12 PM  CBC  WBC  3.9     4.1     6.5   Hemoglobin 13.5 - 17.5 14.3     14.3     14.5   Hematocrit 41 - 53 43     43     43.7   Platelets  150 - 400 K/uL 195     151     205      This result is from an external source.       Latest Ref Rng & Units 08/11/2022   12:00 AM 07/12/2022    4:05 PM 06/23/2022   12:00 AM  CMP  Glucose 70 - 99 mg/dL  84    BUN 4 - 21 14     15  12       Creatinine 0.6 - 1.3 1.0     0.99  1.0      Sodium 137 - 147 139     138  138      Potassium 3.5 - 5.1 mEq/L 4.5     4.7  4.4      Chloride 99 - 108 105     105  105      CO2 13 - 22 25     26  26       Calcium 8.7 - 10.7 9.2     9.2  9.1      Total Protein 6.5 - 8.1 g/dL  6.8    Total Bilirubin 0.3 - 1.2 mg/dL  0.9    Alkaline Phos 25 - 125 84     68  77      AST 14 - 40 31     24  30       ALT 10 - 40 U/L 31     19  21          This result is from an external source.    Lab Results  Component Value Date   CEA1 1.1 06/23/2022   /  CEA  Date Value Ref Range Status  06/23/2022 1.1 0.0 - 4.7 ng/mL Final    Comment:    (NOTE)                             Nonsmokers          <3.9                             Smokers             <5.6 [employer]  Electrochemiluminescence Immunoassay (ECLIA) Values obtained with different assay methods or kits cannot be used interchangeably.  Results cannot be interpreted as absolute evidence of the presence or absence of malignant disease. Performed At: [facility] [address] [place], [place] [id number] Jolene Schimke MD JX:[id number]    No results found for: "PSA1" No results found for: "CAN199" No results found for: "CAN125"  No results found for: "TOTALPROTELP", "ALBUMINELP", "A1GS", "A2GS", "BETS", "BETA2SER", "GAMS", "MSPIKE", "SPEI" Lab Results  Component Value Date   TIBC 225 (L) 11/22/2021   FERRITIN 292 11/22/2021   IRONPCTSAT 6 (L) 11/22/2021   No results found for: "LDH"  STUDIES:  EXAM: 06/21/2022 NUCLEAR MEDICINE PET SKULL BASE TO THIGH  IMPRESSION: 1. Status post right hepatectomy. There are 2 foci of abnormal increased radiotracer uptake identified within the dome of the liver and along the posterior resection margin, which are worrisome for recurrent tumor. 2. There is a new, nonspecific subpleural nodular density within the base of the lingula which has an SUV max of 3.20. This is favored to represent an area of focal inflammation or infection. Metastatic disease is considered less favored but not excluded. Attention on future surveillance imaging  advised.  EXAM: 04/12/2022 MRI ABDOMEN WITHOUT AND WITH CONTRAST  IMPRESSION: 1. Near complete right hepatectomy, without residual/recurrent or new metastatic disease. 2. No evidence of extrahepatic abdominal metastasis. 3.  Aortic Atherosclerosis (ICD10-I70.0).   HISTORY:   Past Medical History:  Diagnosis Date   GERD (gastroesophageal reflux disease)    Renal lithiasis     Past Surgical History:  Procedure Laterality Date   LIVER RESECTION Right 07/02/2019   LIVER RESECTION Right 10/12/2021   SIGMOIDECTOMY  03/2016    Family History  Problem Relation Age of Onset   Lung cancer Father 87    Leukemia Father 43       acute myelogenous   Colon cancer Sister 2    Social History:  reports that he has quit smoking. He has never used smokeless tobacco. He reports that he does not currently use alcohol. No history on file for drug use.The patient is accompanied by his wife today.  Allergies: No Known Allergies  Current Medications: Current Outpatient Medications  Medication Sig Dispense Refill   ascorbic acid (VITAMIN C) 1000 MG tablet Take by mouth.     Calcium Polycarbophil (FIBER) 625 MG TABS Take by mouth.     cetirizine (ZYRTEC) 10 MG tablet Take by mouth.     Cholecalciferol (VITAMIN D) 50 MCG (2000 UT) tablet Take by mouth.     Docusate Sodium (DSS) 100 MG CAPS Take 1 capsule by mouth daily.     Elderberry 500 MG CAPS Take 1 tablet by mouth daily.     hydrocortisone (ANUSOL-HC) 25 MG suppository Place 1 suppository (25 mg total) rectally 2 (two) times daily. 12 suppository 1   lisinopril (ZESTRIL) 20 MG tablet Take 1 tablet (20 mg total) by mouth daily. 30 tablet 5   loperamide (IMODIUM) 2 MG capsule Take 1 capsule (2 mg total) by mouth as needed for diarrhea or loose stools. Take 2 at diarrhea onset , then 1 every 2hr until 12hrs with no BM. May take 2 every 4hrs at night. If diarrhea recurs repeat. 100 capsule 5   LORazepam (ATIVAN) 1 MG tablet Take 1 tablet (1 mg total) by mouth every 6 (six) hours as needed for anxiety or sleep. 100 tablet 0   magic mouthwash (nystatin, lidocaine, diphenhydrAMINE, alum & mag hydroxide) suspension SWISH AND/OR SWALLOW 5 ML BY MOUTH EVERY THREE HOURS IF NEEDED     Misc Natural Products (ELDERBERRY IMMUNE COMPLEX) CHEW Chew 1 Dose by mouth daily.     Multiple Vitamin (MULTI-VITAMIN) tablet Take 1 tablet by mouth daily.     omeprazole (PRILOSEC) 20 MG capsule Take by mouth.     ondansetron (ZOFRAN) 8 MG tablet Take 1 tablet (8 mg total) by mouth 2 (two) times daily as needed for refractory nausea / vomiting. Start on day 3 after  chemotherapy. 30 tablet 1   ondansetron (ZOFRAN-ODT) 4 MG disintegrating tablet Take 4 mg by mouth every 8 (eight) hours as needed. (Patient not taking: Reported on 03/02/2022)     oxyCODONE (OXY IR/ROXICODONE) 5 MG immediate release tablet Take by mouth.     Probiotic Product (PROBIOTIC BLEND PO) Take 1 tablet by mouth daily. (Patient not taking: Reported on 08/04/2022)     prochlorperazine (COMPAZINE) 10 MG tablet Take 1 tablet (10 mg total) by mouth every 6 (six) hours as needed (NAUSEA). (Patient not taking: Reported on 03/02/2022) 30 tablet 1   vitamin B-12 (CYANOCOBALAMIN) 250 MCG tablet Take by mouth.     zinc gluconate 50 MG  tablet Take 50 mg by mouth daily.     No current facility-administered medications for this visit.   Facility-Administered Medications Ordered in Other Visits  Medication Dose Route Frequency Provider Last Rate Last Admin   atropine 1 MG/ML injection            palonosetron (ALOXI) 0.25 MG/5ML injection

## 2022-10-11 ENCOUNTER — Inpatient Hospital Stay: Payer: BC Managed Care – PPO | Attending: Oncology | Admitting: Oncology

## 2022-10-11 ENCOUNTER — Inpatient Hospital Stay: Payer: BC Managed Care – PPO

## 2022-10-11 ENCOUNTER — Other Ambulatory Visit: Payer: Self-pay | Admitting: Oncology

## 2022-10-11 ENCOUNTER — Encounter: Payer: Self-pay | Admitting: Oncology

## 2022-10-11 VITALS — BP 132/98 | HR 71 | Temp 98.3°F | Resp 16 | Ht 78.0 in | Wt 282.4 lb

## 2022-10-11 DIAGNOSIS — G620 Drug-induced polyneuropathy: Secondary | ICD-10-CM | POA: Diagnosis not present

## 2022-10-11 DIAGNOSIS — I159 Secondary hypertension, unspecified: Secondary | ICD-10-CM

## 2022-10-11 DIAGNOSIS — C187 Malignant neoplasm of sigmoid colon: Secondary | ICD-10-CM | POA: Insufficient documentation

## 2022-10-11 DIAGNOSIS — D500 Iron deficiency anemia secondary to blood loss (chronic): Secondary | ICD-10-CM | POA: Diagnosis not present

## 2022-10-11 DIAGNOSIS — C787 Secondary malignant neoplasm of liver and intrahepatic bile duct: Secondary | ICD-10-CM | POA: Diagnosis not present

## 2022-10-11 DIAGNOSIS — I10 Essential (primary) hypertension: Secondary | ICD-10-CM | POA: Insufficient documentation

## 2022-10-11 LAB — CMP (CANCER CENTER ONLY)
ALT: 21 U/L (ref 0–44)
AST: 22 U/L (ref 15–41)
Albumin: 3.9 g/dL (ref 3.5–5.0)
Alkaline Phosphatase: 70 U/L (ref 38–126)
Anion gap: 9 (ref 5–15)
BUN: 11 mg/dL (ref 6–20)
CO2: 26 mmol/L (ref 22–32)
Calcium: 9.2 mg/dL (ref 8.9–10.3)
Chloride: 101 mmol/L (ref 98–111)
Creatinine: 1.1 mg/dL (ref 0.61–1.24)
GFR, Estimated: >60 mL/min (ref >60)
Glucose, Bld: 110 mg/dL — ABNORMAL HIGH (ref 70–99)
Potassium: 4.7 mmol/L (ref 3.5–5.1)
Sodium: 136 mmol/L (ref 135–145)
Total Bilirubin: 0.6 mg/dL (ref 0.3–1.2)
Total Protein: 7.2 g/dL (ref 6.5–8.1)

## 2022-10-11 LAB — CBC WITH DIFFERENTIAL (CANCER CENTER ONLY)
Abs Immature Granulocytes: 0.02 10*3/uL (ref 0.00–0.07)
Basophils Absolute: 0 10*3/uL (ref 0.0–0.1)
Basophils Relative: 0 %
Eosinophils Absolute: 0.1 10*3/uL (ref 0.0–0.5)
Eosinophils Relative: 1 %
HCT: 47.5 % (ref 39.0–52.0)
Hemoglobin: 15.5 g/dL (ref 13.0–17.0)
Immature Granulocytes: 0 %
Lymphocytes Relative: 18 %
Lymphs Abs: 1.3 10*3/uL (ref 0.7–4.0)
MCH: 29.5 pg (ref 26.0–34.0)
MCHC: 32.6 g/dL (ref 30.0–36.0)
MCV: 90.3 fL (ref 80.0–100.0)
Monocytes Absolute: 0.6 10*3/uL (ref 0.1–1.0)
Monocytes Relative: 9 %
Neutro Abs: 5 10*3/uL (ref 1.7–7.7)
Neutrophils Relative %: 72 %
Platelet Count: 204 10*3/uL (ref 150–400)
RBC: 5.26 MIL/uL (ref 4.22–5.81)
RDW: 14.6 % (ref 11.5–15.5)
WBC Count: 7.1 10*3/uL (ref 4.0–10.5)
nRBC: 0 % (ref 0.0–0.2)

## 2022-10-11 MED ORDER — LISINOPRIL 20 MG PO TABS
20.0000 mg | ORAL_TABLET | Freq: Every day | ORAL | 5 refills | Status: DC
Start: 2022-10-11 — End: 2023-09-03

## 2022-10-13 ENCOUNTER — Telehealth: Payer: Self-pay

## 2022-10-13 LAB — CEA: CEA: 1.3 ng/mL (ref 0.0–4.7)

## 2022-10-13 NOTE — Telephone Encounter (Signed)
-----   Message from [name], MD sent at 10/13/2022  7:11 AM EDT ----- Regarding: call Tell him labs look great

## 2022-10-21 ENCOUNTER — Encounter: Payer: Self-pay | Admitting: Oncology

## 2023-06-06 ENCOUNTER — Telehealth: Payer: Self-pay | Admitting: Oncology

## 2023-06-06 ENCOUNTER — Other Ambulatory Visit: Payer: Self-pay | Admitting: Oncology

## 2023-06-06 ENCOUNTER — Encounter: Payer: Self-pay | Admitting: Oncology

## 2023-06-06 DIAGNOSIS — C787 Secondary malignant neoplasm of liver and intrahepatic bile duct: Secondary | ICD-10-CM

## 2023-06-06 NOTE — Telephone Encounter (Signed)
I have contacted pt to schedule a follow up and he will be here next Thursday for labs and an MD visit. Per wife though [facility] Dr. Glennis Brink, was to send her recommendations to you about obtaining scans in February. Would you like for scans to be scheduled prior to his visit with you or should we have him follow up first and then order the scan(s) for thereafter.

## 2023-06-14 ENCOUNTER — Inpatient Hospital Stay: Payer: BC Managed Care – PPO | Attending: Oncology | Admitting: Oncology

## 2023-06-14 ENCOUNTER — Other Ambulatory Visit: Payer: Self-pay | Admitting: Oncology

## 2023-06-14 ENCOUNTER — Ambulatory Visit (HOSPITAL_BASED_OUTPATIENT_CLINIC_OR_DEPARTMENT_OTHER)
Admission: RE | Admit: 2023-06-14 | Discharge: 2023-06-14 | Disposition: A | Discharge status: Home / Self Care | Payer: BC Managed Care – PPO | Source: Ambulatory Visit | Attending: Oncology | Admitting: Oncology

## 2023-06-14 ENCOUNTER — Inpatient Hospital Stay: Payer: Self-pay

## 2023-06-14 ENCOUNTER — Encounter: Payer: Self-pay | Admitting: Oncology

## 2023-06-14 VITALS — BP 118/89 | HR 83 | Temp 98.6°F | Resp 18 | Ht 78.0 in | Wt 294.0 lb

## 2023-06-14 DIAGNOSIS — Z85038 Personal history of other malignant neoplasm of large intestine: Secondary | ICD-10-CM | POA: Insufficient documentation

## 2023-06-14 DIAGNOSIS — C787 Secondary malignant neoplasm of liver and intrahepatic bile duct: Secondary | ICD-10-CM

## 2023-06-14 DIAGNOSIS — G629 Polyneuropathy, unspecified: Secondary | ICD-10-CM | POA: Insufficient documentation

## 2023-06-14 DIAGNOSIS — Z08 Encounter for follow-up examination after completed treatment for malignant neoplasm: Secondary | ICD-10-CM | POA: Insufficient documentation

## 2023-06-14 DIAGNOSIS — I10 Essential (primary) hypertension: Secondary | ICD-10-CM | POA: Insufficient documentation

## 2023-06-14 DIAGNOSIS — C187 Malignant neoplasm of sigmoid colon: Secondary | ICD-10-CM

## 2023-06-14 LAB — CBC WITH DIFFERENTIAL (CANCER CENTER ONLY)
Abs Immature Granulocytes: 0.02 10*3/uL (ref 0.00–0.07)
Basophils Absolute: 0 10*3/uL (ref 0.0–0.1)
Basophils Relative: 1 %
Eosinophils Absolute: 0.1 10*3/uL (ref 0.0–0.5)
Eosinophils Relative: 1 %
HCT: 46.5 % (ref 39.0–52.0)
Hemoglobin: 16.2 g/dL (ref 13.0–17.0)
Immature Granulocytes: 0 %
Lymphocytes Relative: 20 %
Lymphs Abs: 1.5 10*3/uL (ref 0.7–4.0)
MCH: 29.7 pg (ref 26.0–34.0)
MCHC: 34.8 g/dL (ref 30.0–36.0)
MCV: 85.3 fL (ref 80.0–100.0)
Monocytes Absolute: 0.7 10*3/uL (ref 0.1–1.0)
Monocytes Relative: 10 %
Neutro Abs: 5.2 10*3/uL (ref 1.7–7.7)
Neutrophils Relative %: 68 %
Platelet Count: 217 10*3/uL (ref 150–400)
RBC: 5.45 MIL/uL (ref 4.22–5.81)
RDW: 13.6 % (ref 11.5–15.5)
WBC Count: 7.6 10*3/uL (ref 4.0–10.5)
nRBC: 0 % (ref 0.0–0.2)
nRBC: 0 /100{WBCs}

## 2023-06-14 LAB — CMP (CANCER CENTER ONLY)
ALT: 21 U/L (ref 0–44)
AST: 25 U/L (ref 15–41)
Albumin: 4.5 g/dL (ref 3.5–5.0)
Alkaline Phosphatase: 94 U/L (ref 38–126)
Anion gap: 10 (ref 5–15)
BUN: 15 mg/dL (ref 6–20)
CO2: 27 mmol/L (ref 22–32)
Calcium: 9.8 mg/dL (ref 8.9–10.3)
Chloride: 101 mmol/L (ref 98–111)
Creatinine: 1.37 mg/dL — ABNORMAL HIGH (ref 0.61–1.24)
GFR, Estimated: >60 mL/min (ref >60)
Glucose, Bld: 74 mg/dL (ref 70–99)
Potassium: 4.7 mmol/L (ref 3.5–5.1)
Sodium: 138 mmol/L (ref 135–145)
Total Bilirubin: 0.6 mg/dL (ref 0.0–1.2)
Total Protein: 7 g/dL (ref 6.5–8.1)

## 2023-06-14 LAB — CEA (ACCESS): CEA (CHCC): <1 ng/mL (ref 0.00–5.00)

## 2023-06-14 MED ORDER — IOHEXOL 300 MG/ML  SOLN
100.0000 mL | Freq: Once | INTRAMUSCULAR | Status: AC | PRN
  Administered 2023-06-14: 100 mL via INTRAVENOUS

## 2023-06-14 NOTE — Progress Notes (Addendum)
 [facility]  [address] [place],  [place]  [address] [phone]  Clinic Day: 06/14/23  Referring physician: Dottie Norleen PHEBE PONCE, MD  ASSESSMENT & PLAN:  Assessment & Plan: Malignant neoplasm metastatic to liver [facility]) History of liver metastasis in January 2021 treated with resection alone.  He had recurrent liver metastasis treated with a partial hepatectomy in June of 2023.  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  There was 1 negative node but margins were close.  This was attached to the diaphragm but the tissue from the diaphragm is benign.  He had a complicated postoperative course developing an cyst requiring drainage.  He then developed a right pleural effusion in July treated with a VATS procedure. He did have postoperative complications including biliary leak and had ERCP with placement of an endoscopic stent into the biliary duct.  He was readmitted on June 27 with abscess and sepsis and perihepatic fluid collection that required placement of a drain. He is receiving chemotherapy with FOLFIRI and Avastin , which he started in August of 2023. His third cycle of chemotherapy was delayed as he was admitted with sepsis.  He completed IV antibiotics as an outpatient. CT abdomen pelvis from 01/07/2022 showed 12 mm short axis lymph node identified in the region of the porta hepatis.  He completed 12 doses of this chemotherapy as of January 2024.  His MRI abdomen in early December, 2023 at [facility] reveals nearly complete right hepatectomy without residual or recurrent metastatic disease, and no evidence of extrahepatic abdominal metastases. He has now had a PET scan which does show recurrence again, with 2 lesions in the liver so we placed him on Lonsurf with Bevacizumab  which was started 07/12/22 but stopped after 1 month.  He has seen Dr. Ricka and an interventional radiologist at [facility] on April 1st and they repeated the  MRI. She was not convinced the lerions seen on the PET scan were recurrence and the abdominal MRI was stable.  They repeated the MRI at the end of July and this remained consistent with post surgical changes with no new lesions seen.  Repeat was done again at [facility] on 04/23/23 and this remains the same. He will remain off treatment. She recommended I repeat a scan and CEA here and that is also clear with no acute findings as of 06/14/23. She will see him in June and repeat abdominal MRI at that time.   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  He was treated with surgical resection followed by 6 months of adjuvant chemotherapy with FOLFOX. He had ascites and peritoneal nodules in 2018 with negative biopsy and negative fluid cytology.  He was taken for exploratory laparotomy with removal of the nodule which is found to be benign.  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  We discussed postoperative chemotherapy, but ultimately decided on surveillance. He developed another liver metastasis in June of 2023, also resected. He now has recurrent liver metastases again. His PET scan showed 2 foci of abnormal increased radiotracer uptake identified within the dome of the liver and along the posterior resection margin, which appear to be recurrent tumor, with a SUV of 6. One lesion is 1.5 cm and the other is 1.0 cm. We referred him to [facility] to see if any local intervention could be approached. He  had a MRI scan in April 23, 2023 that showed no evidence of tumors in the liver. He is due for colonoscopy in March of 2025.   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   Hypertension This is fairly well controlled with Lisinopril .   Plan:  He had a MRI scan in April 23, 2023 that showed no evidence of tumors in the liver. He has a follow-up with Dr.  Ricka in June and will receive a repeat MRI. He has a WBC of 7.6, hemoglobin of 16.2, and platelet count of 217,000. His CMP is normal other than a elevated creatinine of 1.37. I advised him to increase his fluids and avoid NSAIDs. His CEA today is pending and was less than 2.0 in December, 2024. I will see him back in September with CBC, CMP, and CEA. He is also due  for colonoscopy this year. The patient understands the plans discussed today and is in agreement with them.  He knows to contact our office if he develops concerns prior to his next appointment. His labs are normal, including CEA and hemoglobin of 15.5. I will see him back when needed.   I provided 43 minutes of face-to-face time during this encounter and > 50% was spent counseling as documented under my assessment and plan.   [name], MD [facility] AT [place] [address] [place] [address] Dept: [phone] Dept Fax: [phone]   No orders of the defined types were placed in this encounter.   CHIEF COMPLAINT:  CC: Recurrent colon cancer  Current Treatment: FOLFIRI/bevacizumab   HISTORY OF PRESENT ILLNESS:  The patient is a 53 y.o. 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  He presented with fevers and chills in September 2017 and was found to have diverticulitis, which was treated.  CT at that time revealed a 1.7 cm lesion in the liver, which was nonspecific, as well as ectopic left kidney with a cyst.  He was then brought back for a colonoscopy, which revealed a 3.5 cm mass in the sigmoid.  Biopsy revealed high-grade dysplasia.  CT abdomen and pelvis revealed an indeterminate lesion in the liver in addition to wall thickening in the sigmoid colon.  MRI abdomen revealed the lesion in the liver to represent complex cyst versus vascular lesion, however, follow-up in 6 months was recommended.  Air contrast barium enema revealed an apple-core lesion of  the sigmoid colon.  Baseline CEA was 0.6.  He underwent surgical resection in November 2017.  Pathology revealed a 4 cm, grade 2, adenocarcinoma of the sigmoid colon, as well as chronic active diverticulitis with an area of perforation, however, the tumor was not perforated.  Tumor invaded through the muscularis propria into the pericolorectal tissues.  4/21 nodes were positive for metastasis.  Margins were clear.  MMR and MSI were normal.  KRAS and BRAF mutations were negative.  He had iron deficiency treated with oral iron supplement in the form of Hemocyte daily.  Due to his age of diagnosis and family history he underwent testing for hereditary non polyposis colorectal cancer with the Myriad myRisk Hereditary Cancer Panel test.  This did not reveal any clinically significant mutation or variants of uncertain significance.  Repeat MRI abdomen in January 2018 revealed a stable lesion within the right lobe of the liver, most consistent with benign lesion.  There was new small volume ascites seen.  The patient received adjuvant chemotherapy with FOLFOX  for 12 cycles, which was completed in June.  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  He was placed on gabapentin in July, then noticed abdominal bloating, which he attributed to the gabapentin, so he discontinued that after 1 month.  He was seen for routine follow-up in September 2018 with a repeat MRI abdomen to re-evaluate the liver lesion.  That revealed a large volume ascites with findings suspicious for peritoneal disease.  The lesion in the right lobe of the liver was stable and still assistant with a benign lesion.  The patient underwent paracentesis with removal of 4.7 L of fluid, but fluid cytology was negative for malignancy.  PET scan in early October did not reveal any hypermetabolic activity, but moderate ascites was seen.  Ultrasound-guided peritoneal biopsy was negative for  malignancy, revealing adipose tissue with fibrosis, patchy inflammation and minimal atypia.  His case was discussed at tumor conference and he was referred to Dr. Bert for consideration open biopsy.  Dr. Bert performed colonoscopy in October 2018, which was negative.  Follow-up colonoscopy in 3 years was recommended.  The patient then underwent exploratory laparotomy with multiple peritoneal biopsies.  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  We therefore recommended observation for the patient.  In November 2018, he was seen in the emergency room with abdominal pain, nausea and vomiting.  Gallbladder ultrasound revealed cholelithiasis with sludge and polyps, as well as mild gallbladder wall thickening.  Mild ascites was also seen.  CT abdomen and pelvis revealed mild diffuse gallbladder wall edema otherwise unchanged from PET-CT done in October.  He underwent cholecystectomy with findings of acute cholecystitis.  Pathology did not reveal any evidence of malignancy.    He was seen off schedule in April 2019, as he presented to Dr. Briant office with abdominal pain and was found to have an elevated bilirubin and liver transaminases.  Due to the laboratory abnormalities, he underwent repeat MRI abdomen at that time, which revealed a stable lesion in the right hepatic lobe, which was felt to be indeterminate, with a 2nd smaller indeterminate lesion in the right hepatic lobe measuring 12 mm, which was new from previous imaging.  No hepatic steatosis or ascites was seen.  CEA was normal.  Hepatitis panel and CMV were negative  we felt the laboratory abnormalities were most likely secondary to viral illness.  He was followed closely and underwent a repeat MRI abdomen in June, which remained stable. He had one again in December of 2019, which was stable.  A repeat MRI in October of  2020 revealed a stable lesion but a new increased signal in this area with mild non masslike enhancement and possible thrombosis of adjacent branches of the portal vein, so short term follow up was recommended.  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  MRI abdomen in January 2021 revealed the mass in the junction of segments 7 and 8 in the liver has increased in size compared to the prior exam, currently 2.9 x 2.1 x 2.5 cm.  The lesion has a branching component, some of which may be from portal vein thrombus or tumor thrombus, even this branching component appears to enhance on subtraction images.   Biopsy in February revealed adenocarcinoma with necrosis, consistent with metastasis from colon primary.  He underwent surgical resection in February with Dr. Darice Fairy at  [facility].  We discussed the option of further chemotherapy, including the risks and benefits, and he opted for surveillance.  MRI abdomen in May 2023 revealed another solitary metastasis in the liver.  Treated with partial hepatectomy in June with Dr. Ricka.  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  There was 1 negative node, but margins were close.  This was attached to the diaphragm, but the tissue from the diaphragm was benign.  He had a complicated postoperative course developing an cyst requiring drainage.  He then developed a right pleural effusion in July treated with a VATS procedure.  He is receiving chemotherapy with FOLFIRI and Avastin , which he started in August of 2023. He completed 12 doses of this chemotherapy as of January 2024.  His MRI abdomen in early December, 2023 at [facility] reveals nearly complete right hepatectomy without residual or recurrent metastatic disease, and no evidence of extrahepatic abdominal metastases. He has now had a PET scan which does show recurrence again, with 2 lesions in the liver so we placed him on Lonsurf with Bevacizumab  which  was started 07/12/22 but stopped after 1 month.  He has seen Dr. Ricka and an interventional radiologist at [facility] on April 1st and they repeated the MRI. She was not convinced the lerions seen on the PET scan were recurrence and the abdominal MRI was stable.  They repeated the MRI at the end of July and this remained consistent with post surgical changes with no new lesions seen.  Repeat was done again at [facility] on 04/23/23 and this remains the same. He will remain off treatment. She recommended I repeat a scan and CEA here and that is also clear with no acute findings as of 06/14/23. She will see him in June and repeat abdominal MRI at that time.     Oncology History  Colon cancer (HCC)  01/23/2016 Cancer Staging   Staging form: Colon and Rectum, AJCC 7th Edition - Clinical stage from 01/23/2016: Stage IIIB (T3, N2a, M0) - Signed by Cornelius Wanda DEL, MD on 05/03/2020 Staging comments: 3.5 cm MMR, MSI normal, No mutation of KRAS or BRAF, neg.genetics Rec'd 6 months adjuvant FOLFOX chemotherapy   02/02/2016 Initial Diagnosis   Colon cancer (HCC)   12/13/2021 - 12/29/2021 Chemotherapy   Patient is on Treatment Plan : COLORECTAL FOLFIRI / BEVACIZUMAB  Q14D     12/13/2021 - 06/01/2022 Chemotherapy   Patient is on Treatment Plan : COLORECTAL FOLFIRI + Bevacizumab  q14d     07/12/2022 - 07/26/2022 Chemotherapy   Patient is on Treatment Plan : COLORECTAL Bevacizumab  + Trifluridine /Tipiracil  q28d     Malignant neoplasm metastatic to liver (HCC)  05/29/2019 Initial Diagnosis   Malignant neoplasm metastatic to liver (HCC)   12/13/2021 - 12/29/2021 Chemotherapy   Patient is on Treatment Plan : COLORECTAL FOLFIRI / BEVACIZUMAB  Q14D     12/13/2021 - 06/01/2022 Chemotherapy   Patient is on Treatment Plan : COLORECTAL FOLFIRI + Bevacizumab  q14d     01/07/2022 Imaging   CT abdomen and pelvis:  IMPRESSION:  1. Interval right hepatectomy with surgical changes in the dome of  the left liver.  2. 12  mm short axis lymph node identified in the region of the porta  hepatis. Attention on follow-up recommended.  3. Left pelvic kidney with large simple appearing cyst measuring  12.5 cm, increased from 7.8 cm on 03/14/2017.  4. No acute findings in the abdomen or pelvis. Specifically, no  findings  to explain the patient's history of abdominal pain and  fever.    07/12/2022 - 07/26/2022 Chemotherapy   Patient is on Treatment Plan : COLORECTAL Bevacizumab  + Trifluridine /Tipiracil  q28d       INTERVAL HISTORY:  Errick is here today for repeat clinical assessment for recurrent colon cancer. He has had multiple surgeries for resection of liver metastases by Dr. Darice Fairy at [facility]. Patient states that he feels well and has no complaints of pain. He had a MRI scan in April 23, 2023 that showed no evidence of tumors in the liver. He has a follow-up with Dr. Fairy in June and will receive a repeat MRI. He has a WBC of 7.6, hemoglobin of 16.2, and platelet count of 217,000. His CMP is normal other than a elevated creatinine of 1.37. I advised him to increase his fluids and avoid NSAIDs. His CEA today is pending and was less than 2.0 in December, 2024. I will see him back in September with CBC, CMP, and CEA.   He denies signs of infection such as sore throat, sinus drainage, cough, or urinary symptoms.  He denies fevers or recurrent chills. He denies pain. He denies nausea, vomiting, chest pain, dyspnea or cough. His appetite is good and his weight has increased 12 pounds over last 8 months . He is accompanied at today's visit with his wife.    REVIEW OF SYSTEMS:  Review of Systems  Constitutional: Negative.  Negative for appetite change, chills, diaphoresis, fatigue, fever and unexpected weight change.  HENT:   Positive for hearing loss (decreased hearing in his right ear.). Negative for lump/mass, mouth sores, nosebleeds, sore throat, tinnitus, trouble swallowing and voice change.    Eyes: Negative.  Negative for eye problems and icterus.  Respiratory: Negative.  Negative for chest tightness, cough, hemoptysis, shortness of breath and wheezing.   Cardiovascular: Negative.  Negative for chest pain, leg swelling and palpitations.  Gastrointestinal: Negative.  Negative for abdominal distention, abdominal pain, blood in stool, constipation, diarrhea, nausea, rectal pain and vomiting.  Endocrine: Negative.  Negative for hot flashes.  Genitourinary: Negative.  Negative for bladder incontinence, difficulty urinating, dyspareunia, dysuria, frequency, hematuria, nocturia, pelvic pain and penile discharge.   Musculoskeletal: Negative.  Negative for arthralgias, back pain, flank pain, gait problem, myalgias, neck pain and neck stiffness.  Skin: Negative.  Negative for itching, rash and wound.  Neurological:  Positive for numbness (in his feet). Negative for dizziness, extremity weakness, gait problem, headaches, light-headedness, seizures and speech difficulty.  Hematological: Negative.  Negative for adenopathy. Does not bruise/bleed easily.  Psychiatric/Behavioral: Negative.  Negative for confusion, decreased concentration, depression, sleep disturbance and suicidal ideas. The patient is not nervous/anxious.      VITALS:  Blood pressure 118/89, pulse 83, temperature 98.6 F (37 C), temperature source Oral, resp. rate 18, height 6' 6 (1.981 m), weight 294 lb (133.4 kg), SpO2 99%.  Wt Readings from Last 3 Encounters:  06/14/23 294 lb (133.4 kg)  10/11/22 282 lb 6.4 oz (128.1 kg)  08/11/22 279 lb 9.6 oz (126.8 kg)    Body mass index is 33.98 kg/m.  Performance status (ECOG): 1 - Symptomatic but completely ambulatory  PHYSICAL EXAM:  Physical Exam Vitals and nursing note reviewed. Exam conducted with a chaperone present.  Constitutional:      General: He is not in acute distress.    Appearance: Normal appearance. He is normal weight. He is not ill-appearing, toxic-appearing  or diaphoretic.  HENT:     Head: Normocephalic  and atraumatic.     Right Ear: Tympanic membrane, ear canal and external ear normal. There is no impacted cerumen.     Left Ear: Tympanic membrane, ear canal and external ear normal. There is no impacted cerumen.     Nose: Nose normal. No congestion or rhinorrhea.     Mouth/Throat:     Mouth: Mucous membranes are moist.     Pharynx: Oropharynx is clear. No oropharyngeal exudate or posterior oropharyngeal erythema.  Eyes:     General: No scleral icterus.       Right eye: No discharge.        Left eye: No discharge.     Extraocular Movements: Extraocular movements intact.     Conjunctiva/sclera: Conjunctivae normal.     Pupils: Pupils are equal, round, and reactive to light.  Neck:     Vascular: No carotid bruit.  Cardiovascular:     Rate and Rhythm: Normal rate and regular rhythm.     Pulses: Normal pulses.     Heart sounds: Normal heart sounds. No murmur heard.    No friction rub. No gallop.  Pulmonary:     Effort: Pulmonary effort is normal. No respiratory distress.     Breath sounds: No stridor. No decreased breath sounds, wheezing, rhonchi or rales.  Chest:     Chest wall: No tenderness.  Abdominal:     General: Bowel sounds are normal. There is no distension.     Palpations: Abdomen is soft. There is no hepatomegaly, splenomegaly or mass.     Tenderness: There is no abdominal tenderness. There is no right CVA tenderness, left CVA tenderness, guarding or rebound.     Hernia: No hernia is present.     Comments: Large midline scar with firmness just inferior to it  Genitourinary:    Rectum: Guaiac result negative.  Musculoskeletal:        General: No swelling, tenderness, deformity or signs of injury. Normal range of motion.     Cervical back: Normal range of motion and neck supple. No rigidity or tenderness.     Right lower leg: No edema.     Left lower leg: No edema.  Lymphadenopathy:     Cervical: No cervical adenopathy.      Upper Body:     Right upper body: No supraclavicular or axillary adenopathy.     Left upper body: No supraclavicular or axillary adenopathy.     Lower Body: No right inguinal adenopathy. No left inguinal adenopathy.  Skin:    General: Skin is warm and dry.     Coloration: Skin is not jaundiced or pale.     Findings: No bruising, erythema, lesion or rash.  Neurological:     General: No focal deficit present.     Mental Status: He is alert and oriented to person, place, and time. Mental status is at baseline.     Cranial Nerves: No cranial nerve deficit.     Sensory: No sensory deficit.     Motor: No weakness.     Coordination: Coordination normal.     Gait: Gait normal.     Deep Tendon Reflexes: Reflexes normal.  Psychiatric:        Mood and Affect: Mood normal.        Behavior: Behavior normal.        Thought Content: Thought content normal.        Judgment: Judgment normal.    LABS:      Latest Ref Rng & Units  06/14/2023    3:56 PM 10/11/2022    2:31 PM 08/11/2022   12:00 AM  CBC  WBC 4.0 - 10.5 K/uL 7.6  7.1  3.9      Hemoglobin 13.0 - 17.0 g/dL 83.7  84.4  85.6      Hematocrit 39.0 - 52.0 % 46.5  47.5  43      Platelets 150 - 400 K/uL 217  204  195         This result is from an external source.      Latest Ref Rng & Units 06/14/2023    3:56 PM 10/11/2022    2:31 PM 08/11/2022   12:00 AM  CMP  Glucose 70 - 99 mg/dL 74  889    BUN 6 - 20 mg/dL 15  11  14       Creatinine 0.61 - 1.24 mg/dL 8.62  8.89  1.0      Sodium 135 - 145 mmol/L 138  136  139      Potassium 3.5 - 5.1 mmol/L 4.7  4.7  4.5      Chloride 98 - 111 mmol/L 101  101  105      CO2 22 - 32 mmol/L 27  26  25       Calcium  8.9 - 10.3 mg/dL 9.8  9.2  9.2      Total Protein 6.5 - 8.1 g/dL 7.0  7.2    Total Bilirubin 0.0 - 1.2 mg/dL 0.6  0.6    Alkaline Phos 38 - 126 U/L 94  70  84      AST 15 - 41 U/L 25  22  31       ALT 0 - 44 U/L 21  21  31          This result is from an external source.   Lab Results   Component Value Date   CEA1 1.3 10/11/2022   CEA <1.00 06/14/2023   /  CEA  Date Value Ref Range Status  10/11/2022 1.3 0.0 - 4.7 ng/mL Final    Comment:    (NOTE)                             Nonsmokers          <3.9                             Smokers             <5.6 [employer] Electrochemiluminescence Immunoassay (ECLIA) Values obtained with different assay methods or kits cannot be used interchangeably.  Results cannot be interpreted as absolute evidence of the presence or absence of malignant disease. Performed At: [facility] [address] [place], [place] [id number] Jennette Shorter MD Ey:[id number]    CEA ([facility])  Date Value Ref Range Status  06/14/2023 <1.00 0.00 - 5.00 ng/mL Final    Comment:    (NOTE) This test was performed using [employer] Coul[employer]'s paramagnetic chemiluminescent immunoassay. Values obtained from different assay methods cannot be used interchangeably. Please note that up to 8% of patients who smoke may see values 5.1-10.0 ng/ml and 1% of patients who smoke may see CEA levels >10.0 ng/ml. Performed at [facility], [address] [place], [place] [address]    No results found for: PSA1 No results found for: CAN199 No results found for: CAN125  No results found for:  TOTALPROTELP, ALBUMINELP, A1GS, A2GS, BETS, BETA2SER, GAMS, MSPIKE, SPEI Lab Results  Component Value Date   TIBC 225 (L) 11/22/2021   FERRITIN 292 11/22/2021   IRONPCTSAT 6 (L) 11/22/2021   No results found for: LDH  STUDIES:  No studies found  HISTORY:   Past Medical History:  Diagnosis Date   GERD (gastroesophageal reflux disease)    Renal lithiasis     Past Surgical History:  Procedure Laterality Date   LIVER RESECTION Right 07/02/2019   LIVER RESECTION Right 10/12/2021   SIGMOIDECTOMY  03/2016    Family History  Problem Relation Age of Onset   Lung cancer Father 96   Leukemia Father 71        acute myelogenous   Colon cancer Sister 20    Social History:  reports that he has quit smoking. He has never used smokeless tobacco. He reports that he does not currently use alcohol. No history on file for drug use.The patient is accompanied by his wife today.  Allergies: No Known Allergies  Current Medications: Current Outpatient Medications  Medication Sig Dispense Refill   ascorbic acid (VITAMIN C) 1000 MG tablet Take by mouth.     Calcium  Polycarbophil (FIBER) 625 MG TABS Take by mouth.     cetirizine (ZYRTEC) 10 MG tablet Take by mouth.     Cholecalciferol (VITAMIN D) 50 MCG (2000 UT) tablet Take by mouth.     Docusate Sodium (DSS) 100 MG CAPS Take 1 capsule by mouth daily.     Docusate Sodium (DSS) 100 MG CAPS Take by mouth.     Elderberry 500 MG CAPS Take 1 tablet by mouth daily.     hydrocortisone  (ANUSOL -HC) 25 MG suppository Place 1 suppository (25 mg total) rectally 2 (two) times daily. 12 suppository 1   lisinopril  (ZESTRIL ) 20 MG tablet Take 1 tablet (20 mg total) by mouth daily. 30 tablet 5   loperamide  (IMODIUM ) 2 MG capsule Take 1 capsule (2 mg total) by mouth as needed for diarrhea or loose stools. Take 2 at diarrhea onset , then 1 every 2hr until 12hrs with no BM. May take 2 every 4hrs at night. If diarrhea recurs repeat. 100 capsule 5   LORazepam  (ATIVAN ) 1 MG tablet Take 1 tablet (1 mg total) by mouth every 6 (six) hours as needed for anxiety or sleep. 100 tablet 0   Misc Natural Products (ELDERBERRY IMMUNE COMPLEX) CHEW Chew 1 Dose by mouth daily.     Multiple Vitamin (MULTI-VITAMIN) tablet Take 1 tablet by mouth daily.     omeprazole (PRILOSEC) 20 MG capsule Take by mouth.     ondansetron  (ZOFRAN ) 8 MG tablet Take 1 tablet (8 mg total) by mouth 2 (two) times daily as needed for refractory nausea / vomiting. Start on day 3 after chemotherapy. 30 tablet 1   ondansetron  (ZOFRAN -ODT) 4 MG disintegrating tablet Take 4 mg by mouth every 8 (eight) hours as needed. (Patient  not taking: Reported on 03/02/2022)     oxyCODONE (OXY IR/ROXICODONE) 5 MG immediate release tablet Take by mouth.     prochlorperazine  (COMPAZINE ) 10 MG tablet Take 1 tablet (10 mg total) by mouth every 6 (six) hours as needed (NAUSEA). (Patient not taking: Reported on 03/02/2022) 30 tablet 1   vitamin B-12 (CYANOCOBALAMIN ) 250 MCG tablet Take by mouth.     zinc gluconate 50 MG tablet Take 50 mg by mouth daily.     No current facility-administered medications for this visit.   Facility-Administered Medications Ordered in Other  Visits  Medication Dose Route Frequency Provider Last Rate Last Admin   atropine  1 MG/ML injection            palonosetron  (ALOXI ) 0.25 MG/5ML injection             I,Jasmine M Lassiter,acting as a scribe for [name], MD.,have documented all relevant documentation on the behalf of [name], MD,as directed by  [name], MD while in the presence of [name], MD.

## 2023-06-15 ENCOUNTER — Telehealth: Payer: Self-pay

## 2023-06-15 NOTE — Telephone Encounter (Signed)
-----   Message from Nolia Baumgartner sent at 06/15/2023  2:26 PM EST ----- Regarding: call Let them know CT scan all looks clear

## 2023-06-15 NOTE — Telephone Encounter (Signed)
 Attempted to contact patient. No answer.

## 2023-06-22 ENCOUNTER — Telehealth: Payer: Self-pay

## 2023-06-22 ENCOUNTER — Encounter: Payer: Self-pay | Admitting: Oncology

## 2023-06-22 NOTE — Telephone Encounter (Signed)
Labs and CT scans sent to Dr. Glennis Brink.

## 2023-06-22 NOTE — Telephone Encounter (Signed)
-----   Message from [name] sent at 06/22/2023 10:39 AM EST ----- Regarding: CT Pls send copy of CT report and labs to Dr. Whitney Post

## 2023-09-03 ENCOUNTER — Other Ambulatory Visit: Payer: Self-pay

## 2023-09-03 ENCOUNTER — Other Ambulatory Visit: Payer: Self-pay | Admitting: Hematology and Oncology

## 2023-09-03 DIAGNOSIS — I159 Secondary hypertension, unspecified: Secondary | ICD-10-CM

## 2023-09-03 MED ORDER — LISINOPRIL 20 MG PO TABS: 20.0000 mg | ORAL_TABLET | Freq: Every day | ORAL | 3 refills | Status: AC

## 2023-09-03 MED ORDER — LISINOPRIL 20 MG PO TABS
20.0000 mg | ORAL_TABLET | Freq: Every day | ORAL | 3 refills | Status: DC
Start: 2023-09-03 — End: 2023-09-03

## 2023-09-03 MED ORDER — LISINOPRIL 20 MG PO TABS: 20.0000 mg | ORAL_TABLET | Freq: Every day | ORAL | 4 refills | Status: DC

## 2023-09-04 ENCOUNTER — Encounter: Payer: Self-pay | Admitting: Oncology

## 2024-02-14 ENCOUNTER — Telehealth: Payer: Self-pay | Admitting: Oncology

## 2024-02-14 ENCOUNTER — Inpatient Hospital Stay (HOSPITAL_BASED_OUTPATIENT_CLINIC_OR_DEPARTMENT_OTHER): Admitting: Oncology

## 2024-02-14 ENCOUNTER — Inpatient Hospital Stay: Attending: Oncology

## 2024-02-14 ENCOUNTER — Other Ambulatory Visit: Payer: Self-pay | Admitting: Oncology

## 2024-02-14 VITALS — BP 123/90 | HR 59 | Temp 98.2°F | Resp 18 | Ht 78.0 in | Wt 287.1 lb

## 2024-02-14 DIAGNOSIS — C787 Secondary malignant neoplasm of liver and intrahepatic bile duct: Secondary | ICD-10-CM

## 2024-02-14 DIAGNOSIS — C187 Malignant neoplasm of sigmoid colon: Secondary | ICD-10-CM | POA: Diagnosis not present

## 2024-02-14 DIAGNOSIS — I10 Essential (primary) hypertension: Secondary | ICD-10-CM | POA: Insufficient documentation

## 2024-02-14 DIAGNOSIS — G629 Polyneuropathy, unspecified: Secondary | ICD-10-CM | POA: Insufficient documentation

## 2024-02-14 DIAGNOSIS — N522 Drug-induced erectile dysfunction: Secondary | ICD-10-CM

## 2024-02-14 LAB — CMP (CANCER CENTER ONLY)
ALT: 19 U/L (ref 0–44)
AST: 24 U/L (ref 15–41)
Albumin: 4.4 g/dL (ref 3.5–5.0)
Alkaline Phosphatase: 103 U/L (ref 38–126)
Anion gap: 9 (ref 5–15)
BUN: 13 mg/dL (ref 6–20)
CO2: 27 mmol/L (ref 22–32)
Calcium: 9.5 mg/dL (ref 8.9–10.3)
Chloride: 102 mmol/L (ref 98–111)
Creatinine: 1.12 mg/dL (ref 0.61–1.24)
GFR, Estimated: >60 mL/min (ref >60)
Glucose, Bld: 110 mg/dL — ABNORMAL HIGH (ref 70–99)
Potassium: 4.7 mmol/L (ref 3.5–5.1)
Sodium: 138 mmol/L (ref 135–145)
Total Bilirubin: 0.6 mg/dL (ref 0.0–1.2)
Total Protein: 7.2 g/dL (ref 6.5–8.1)

## 2024-02-14 LAB — CBC WITH DIFFERENTIAL (CANCER CENTER ONLY)
Abs Immature Granulocytes: 0.01 K/uL (ref 0.00–0.07)
Basophils Absolute: 0 K/uL (ref 0.0–0.1)
Basophils Relative: 1 %
Eosinophils Absolute: 0.2 K/uL (ref 0.0–0.5)
Eosinophils Relative: 3 %
HCT: 46.6 % (ref 39.0–52.0)
Hemoglobin: 15.8 g/dL (ref 13.0–17.0)
Immature Granulocytes: 0 %
Lymphocytes Relative: 21 %
Lymphs Abs: 1.4 K/uL (ref 0.7–4.0)
MCH: 29.6 pg (ref 26.0–34.0)
MCHC: 33.9 g/dL (ref 30.0–36.0)
MCV: 87.4 fL (ref 80.0–100.0)
Monocytes Absolute: 0.6 K/uL (ref 0.1–1.0)
Monocytes Relative: 9 %
Neutro Abs: 4.2 K/uL (ref 1.7–7.7)
Neutrophils Relative %: 66 %
Platelet Count: 207 K/uL (ref 150–400)
RBC: 5.33 MIL/uL (ref 4.22–5.81)
RDW: 13.2 % (ref 11.5–15.5)
WBC Count: 6.4 K/uL (ref 4.0–10.5)
nRBC: 0 % (ref 0.0–0.2)

## 2024-02-14 LAB — CEA (ACCESS): CEA (CHCC): <1 ng/mL (ref 0.00–5.00)

## 2024-02-14 MED ORDER — SILDENAFIL CITRATE 100 MG PO TABS: 100.0000 mg | ORAL_TABLET | Freq: Every day | ORAL | 0 refills | Status: AC | PRN

## 2024-02-14 NOTE — Telephone Encounter (Signed)
 Patient has been scheduled for follow-up visit per 02/14/24 LOS.  Pt given an appt calendar with date and time. \

## 2024-02-14 NOTE — Progress Notes (Signed)
 [facility]  [address] [place],  [place]  [address] [phone]  Clinic Day: 02/14/24  Referring physician: Dottie Norleen PHEBE PONCE, MD  ASSESSMENT & PLAN:  Assessment: Malignant neoplasm metastatic to liver [facility]) History of liver metastasis in January 2021 treated with resection alone.  He had recurrent liver metastasis treated with a partial hepatectomy in June of 2023.  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  There was 1 negative node but margins were close.  This was attached to the diaphragm but the tissue from the diaphragm is benign.  He had a complicated postoperative course developing an cyst requiring drainage.  He then developed a right pleural effusion in July treated with a VATS procedure. He did have postoperative complications including biliary leak and had ERCP with placement of an endoscopic stent into the biliary duct.  He was readmitted on June 27 with abscess and sepsis and perihepatic fluid collection that required placement of a drain. He is receiving chemotherapy with FOLFIRI and Avastin , which he started in August of 2023. His third cycle of chemotherapy was delayed as he was admitted with sepsis.  He completed IV antibiotics as an outpatient. CT abdomen pelvis from 01/07/2022 showed 12 mm short axis lymph node identified in the region of the porta hepatis.  He completed 12 doses of this chemotherapy as of January 2024.  His MRI abdomen in early December, 2023 at [facility] reveals nearly complete right hepatectomy without residual or recurrent metastatic disease, and no evidence of extrahepatic abdominal metastases. He has now had a PET scan which does show recurrence again, with 2 lesions in the liver so we placed him on Lonsurf with Bevacizumab  which was started 07/12/22 but stopped after 1 month.  He has seen Dr. Ricka and an interventional radiologist at [facility] on April 1st and they repeated the MRI. She  was not convinced the lerions seen on the PET scan were recurrence and the abdominal MRI was stable.  They repeated the MRI at the end of July 2024 and this remained consistent with post surgical changes with no new lesions seen.  Repeat was done again at [facility] on 04/23/23 and this remains the same. He will remain off treatment. CT scan here in February remains stable and he had an MRI of the abdomen at [facility] on 10/22/2023 which revealed no evidence of metastatic disease within the abdomen and redemonstrated changes of right hepatectomy and en bloc segment 6, 7, and 8 resection. There were no new concerning hepatic lesions. She will see him back in June 2026 with repeat scans.  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  He was treated with surgical resection followed by 6 months of adjuvant chemotherapy with FOLFOX. He had ascites and peritoneal nodules in 2018 with negative biopsy and negative fluid cytology.  He was taken for exploratory laparotomy with removal of the nodule which is found to be benign.  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  We discussed postoperative chemotherapy, but ultimately decided on surveillance. He developed another liver metastasis in June of 2023, also resected. He now has recurrent liver metastases again. His PET scan showed 2 foci of abnormal increased radiotracer uptake identified within the dome of the liver and along the posterior resection margin, which appear to be recurrent tumor, with a SUV of 6.  One lesion is 1.5 cm and the other is 1.0 cm. We referred him to [facility] to see if any local intervention could be approached. He had a MRI scan in April 23, 2023 that showed no evidence of tumors in the liver. His last colonoscopy was performed by Dr. Ozell Skene in March 2025.   Peripheral Neuropathy This is still persistent since his FOLFOX  chemotherapy.   Hypertension This is fairly well controlled with Lisinopril .   Plan:  He recently had his port removed. The scar is healing well with mild swelling. He states that he has been experiencing erectile dysfunction asked if he is able to take medication for this. I informed him that this is fine and I will prescribe 100 mg Viagra tablets PRN. His MRI abdomen on 10/22/2023 revealed no evidence of metastatic disease within the abdomen and redemonstrated changes of right hepatectomy and en bloc segment 6, 7, and 8 resection. No new concerning hepatic lesions. Dr. Ricka will see him back in 1 year, in June of 2026. He has a WBC of 6.4, hemoglobin of 15.8, and platelet count of 207,000. His CMP is completely normal. His CEA is pending. I will see him back in 4 months with CBC, CMP, and CEA. The patient understands the plans discussed today and is in agreement with them. He knows to contact our office if he develops concerns prior to his next appointment.   I provided 13 minutes of face-to-face time during this encounter and > 50% was spent counseling as documented under my assessment and plan.   Eduardo VEAR Cornish, MD [facility] [place] [address] [place] [address] Dept: [phone] Dept Fax: [phone]   No orders of the defined types were placed in this encounter.   CHIEF COMPLAINT:  CC: Recurrent colon cancer  Current Treatment: FOLFIRI/bevacizumab   HISTORY OF PRESENT ILLNESS:  The patient is a 53 y.o. 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  He presented with fevers and chills in September 2017 and was found to have diverticulitis, which was treated.  CT at that time revealed a 1.7 cm lesion in the liver, which was nonspecific, as well as ectopic left kidney with a cyst.  He was then brought back for a colonoscopy, which revealed a 3.5 cm mass in the sigmoid.  Biopsy revealed high-grade dysplasia.  CT  abdomen and pelvis revealed an indeterminate lesion in the liver in addition to wall thickening in the sigmoid colon.  MRI abdomen revealed the lesion in the liver to represent complex cyst versus vascular lesion, however, follow-up in 6 months was recommended.  Air contrast barium enema revealed an apple-core lesion of the sigmoid colon.  Baseline CEA was 0.6.  He underwent surgical resection in November 2017.  Pathology revealed a 4 cm, grade 2, adenocarcinoma of the sigmoid colon, as well as chronic active diverticulitis with an area of perforation, however, the tumor was not perforated.  Tumor invaded through the muscularis propria into the pericolorectal tissues.  4/21 nodes were positive for metastasis.  Margins were clear.  MMR and MSI were normal.  KRAS and BRAF mutations were negative.  He had iron deficiency treated with oral iron supplement in the form of Hemocyte daily.  Due to his age of diagnosis and family history he underwent testing for hereditary non polyposis colorectal cancer with the Myriad myRisk Hereditary Cancer Panel test.  This did not reveal any clinically significant mutation or variants of  uncertain significance.  Repeat MRI abdomen in January 2018 revealed a stable lesion within the right lobe of the liver, most consistent with benign lesion.  There was new small volume ascites seen.  The patient received adjuvant chemotherapy with FOLFOX  for 12 cycles, which was completed in June.  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  He was placed on gabapentin in July, then noticed abdominal bloating, which he attributed to the gabapentin, so he discontinued that after 1 month.  He was seen for routine follow-up in September 2018 with a repeat MRI abdomen to re-evaluate the liver lesion.  That revealed a large volume ascites with findings suspicious for peritoneal disease.  The lesion in the right lobe of the liver was stable  and still assistant with a benign lesion.  The patient underwent paracentesis with removal of 4.7 L of fluid, but fluid cytology was negative for malignancy.  PET scan in early October did not reveal any hypermetabolic activity, but moderate ascites was seen.  Ultrasound-guided peritoneal biopsy was negative for malignancy, revealing adipose tissue with fibrosis, patchy inflammation and minimal atypia.  His case was discussed at tumor conference and he was referred to Dr. Bert for consideration open biopsy.  Dr. Bert performed colonoscopy in October 2018, which was negative.  Follow-up colonoscopy in 3 years was recommended.  The patient then underwent exploratory laparotomy with multiple peritoneal biopsies.  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  We therefore recommended observation for the patient.  In November 2018, he was seen in the emergency room with abdominal pain, nausea and vomiting.  Gallbladder ultrasound revealed cholelithiasis with sludge and polyps, as well as mild gallbladder wall thickening.  Mild ascites was also seen.  CT abdomen and pelvis revealed mild diffuse gallbladder wall edema otherwise unchanged from PET-CT done in October.  He underwent cholecystectomy with findings of acute cholecystitis.  Pathology did not reveal any evidence of malignancy.    He was seen off schedule in April 2019, as he presented to Dr. Briant office with abdominal pain and was found to have an elevated bilirubin and liver transaminases.  Due to the laboratory abnormalities, he underwent repeat MRI abdomen at that time, which revealed a stable lesion in the right hepatic lobe, which was felt to be indeterminate, with a 2nd smaller indeterminate lesion in the right hepatic lobe measuring 12 mm, which was new from previous imaging.  No hepatic steatosis or ascites was seen.  CEA  was normal.  Hepatitis panel and CMV were negative  we felt the laboratory abnormalities were most likely secondary to viral illness.  He was followed closely and underwent a repeat MRI abdomen in June, which remained stable. He had one again in December of 2019, which was stable.  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  MRI abdomen in January 2021 revealed the mass in the junction of segments 7 and 8 in the liver has increased in size compared to the prior exam, currently 2.9 x 2.1 x 2.5 cm.  The lesion has a branching component, some of which may be from portal  vein thrombus or tumor thrombus, even this branching component appears to enhance on subtraction images.   Biopsy in February revealed adenocarcinoma with necrosis, consistent with metastasis from colon primary.  He underwent surgical resection in February with Dr. Darice Fairy at [facility].  We discussed the option of further chemotherapy, including the risks and benefits, and he opted for surveillance.  MRI abdomen in May 2023 revealed another solitary metastasis in the liver.  Treated with partial hepatectomy in June with Dr. Fairy.  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  There was 1 negative node, but margins were close.  This was attached to the diaphragm, but the tissue from the diaphragm was benign.  He had a complicated postoperative course developing an cyst requiring drainage.  He then developed a right pleural effusion in July treated with a VATS procedure.  He is receiving chemotherapy with FOLFIRI and Avastin , which he started in August of 2023. He completed 12 doses of this chemotherapy as of January 2024.  His MRI abdomen in early  December, 2023 at [facility] reveals nearly complete right hepatectomy without residual or recurrent metastatic disease, and no evidence of extrahepatic abdominal metastases. He has now had a PET scan which does show recurrence again, with 2 lesions in the liver so we placed him on Lonsurf with Bevacizumab  which was started 07/12/22 but stopped after 1 month.  He has seen Dr. Fairy and an interventional radiologist at [facility] on April 1st and they repeated the MRI. She was not convinced the lerions seen on the PET scan were recurrence and the abdominal MRI was stable.  They repeated the MRI at the end of July and this remained consistent with post surgical changes with no new lesions seen.  Repeat was done again at [facility] on 04/23/23 and this remains the same. He will remain off treatment. She recommended I repeat a scan and CEA here and that is also clear with no acute findings as of 06/14/23. She will see him in June and repeat abdominal MRI at that time.     Oncology History  Colon cancer (HCC)  01/23/2016 Cancer Staging   Staging form: Colon and Rectum, AJCC 7th Edition - Clinical stage from 01/23/2016: Stage IIIB (T3, N2a, M0) - Signed by Cornelius Eduardo DEL, MD on 05/03/2020 Staging comments: 3.5 cm MMR, MSI normal, No mutation of KRAS or BRAF, neg.genetics Rec'd 6 months adjuvant FOLFOX chemotherapy   02/02/2016 Initial Diagnosis   Colon cancer (HCC)   12/13/2021 - 12/29/2021 Chemotherapy   Patient is on Treatment Plan : COLORECTAL FOLFIRI / BEVACIZUMAB  Q14D     12/13/2021 - 06/01/2022 Chemotherapy   Patient is on Treatment Plan : COLORECTAL FOLFIRI + Bevacizumab  q14d     07/12/2022 - 07/26/2022 Chemotherapy   Patient is on Treatment Plan : COLORECTAL Bevacizumab  + Trifluridine /Tipiracil  q28d     Malignant neoplasm metastatic to liver (HCC)  05/29/2019 Initial Diagnosis   Malignant neoplasm metastatic to liver (HCC)   12/13/2021 - 12/29/2021 Chemotherapy   Patient is on Treatment  Plan : COLORECTAL FOLFIRI / BEVACIZUMAB  Q14D     12/13/2021 - 06/01/2022 Chemotherapy   Patient is on Treatment Plan : COLORECTAL FOLFIRI + Bevacizumab  q14d     01/07/2022 Imaging   CT abdomen and pelvis:  IMPRESSION:  1. Interval right hepatectomy with surgical changes in the dome of  the left liver.  2. 12 mm short axis lymph node identified in the region of  the porta  hepatis. Attention on follow-up recommended.  3. Left pelvic kidney with large simple appearing cyst measuring  12.5 cm, increased from 7.8 cm on 03/14/2017.  4. No acute findings in the abdomen or pelvis. Specifically, no  findings to explain the patient's history of abdominal pain and  fever.    07/12/2022 - 07/26/2022 Chemotherapy   Patient is on Treatment Plan : COLORECTAL Bevacizumab  + Trifluridine /Tipiracil  q28d       INTERVAL HISTORY:  Eduardo Hester is here today for repeat clinical assessment for recurrent colon cancer. He has had multiple surgeries for resection of liver metastases by Dr. Darice Fairy at [facility]. Patient states that he feels good and has no complaints of pain. He recently had his port removed. The scar is healing well with mild swelling. He states that he has been experiencing erectile dysfunction asked if he is able to take medication for this. I informed him that this is fine and I will prescribe 100 mg Viagra tablets PRN. His MRI abdomen on 10/22/2023 revealed no evidence of metastatic disease within the abdomen and redemonstrated changes of right hepatectomy and en bloc segment 6, 7, and 8 resection. No new concerning hepatic lesions. Dr. Fairy will see him back in 1 year, in June of 2026. He has a WBC of 6.4, hemoglobin of 15.8, and platelet count of 207,000. His CMP is completely normal. His CEA is pending. His latest colonoscopy was in March of 2025 by Dr. Ozell Skene.  I will see him back in 4 months with CBC, CMP, and CEA.  He denies fever, chills, night sweats, or other signs of  infection. He denies cardiorespiratory and gastrointestinal issues. He  denies pain. His appetite is good and His weight has decreased 7 pounds over last 8 months. He is accompanied at today's visit with his wife.    REVIEW OF SYSTEMS:  Review of Systems  Constitutional: Negative.  Negative for appetite change, chills, diaphoresis, fatigue, fever and unexpected weight change.  HENT:   Positive for hearing loss (decreased hearing in his right ear.). Negative for lump/mass, mouth sores, nosebleeds, sore throat, tinnitus, trouble swallowing and voice change.   Eyes: Negative.  Negative for eye problems and icterus.  Respiratory: Negative.  Negative for chest tightness, cough, hemoptysis, shortness of breath and wheezing.   Cardiovascular: Negative.  Negative for chest pain, leg swelling and palpitations.  Gastrointestinal: Negative.  Negative for abdominal distention, abdominal pain, blood in stool, constipation, diarrhea, nausea, rectal pain and vomiting.  Endocrine: Negative.  Negative for hot flashes.  Genitourinary: Negative.  Negative for bladder incontinence, difficulty urinating, dyspareunia, dysuria, frequency, hematuria, nocturia, pelvic pain and penile discharge.        Sexual dysfunction  Musculoskeletal: Negative.  Negative for arthralgias, back pain, flank pain, gait problem, myalgias, neck pain and neck stiffness.  Skin: Negative.  Negative for itching, rash and wound.  Neurological:  Positive for numbness (in his feet). Negative for dizziness, extremity weakness, gait problem, headaches, light-headedness, seizures and speech difficulty.  Hematological: Negative.  Negative for adenopathy. Does not bruise/bleed easily.  Psychiatric/Behavioral: Negative.  Negative for confusion, decreased concentration, depression, sleep disturbance and suicidal ideas. The patient is not nervous/anxious.      VITALS:  Blood pressure (!) 123/90, pulse (!) 59, temperature 98.2 F (36.8 C), temperature  source Oral, resp. rate 18, height 6' 6 (1.981 m), weight 287 lb 1.6 oz (130.2 kg), SpO2 100%.  Wt Readings from Last 3 Encounters:  02/14/24 287 lb 1.6 oz (  130.2 kg)  06/14/23 294 lb (133.4 kg)  10/11/22 282 lb 6.4 oz (128.1 kg)    Body mass index is 33.18 kg/m.  Performance status (ECOG): 1 - Symptomatic but completely ambulatory  PHYSICAL EXAM:  Physical Exam Vitals and nursing note reviewed. Exam conducted with a chaperone present.  Constitutional:      General: He is not in acute distress.    Appearance: Normal appearance. He is normal weight. He is not ill-appearing, toxic-appearing or diaphoretic.  HENT:     Head: Normocephalic and atraumatic.     Right Ear: Tympanic membrane, ear canal and external ear normal. There is no impacted cerumen.     Left Ear: Tympanic membrane, ear canal and external ear normal. There is no impacted cerumen.     Nose: Nose normal. No congestion or rhinorrhea.     Mouth/Throat:     Mouth: Mucous membranes are moist.     Pharynx: Oropharynx is clear. No oropharyngeal exudate or posterior oropharyngeal erythema.  Eyes:     General: No scleral icterus.       Right eye: No discharge.        Left eye: No discharge.     Extraocular Movements: Extraocular movements intact.     Conjunctiva/sclera: Conjunctivae normal.     Pupils: Pupils are equal, round, and reactive to light.  Neck:     Vascular: No carotid bruit.  Cardiovascular:     Rate and Rhythm: Normal rate and regular rhythm.     Pulses: Normal pulses.     Heart sounds: Normal heart sounds. No murmur heard.    No friction rub. No gallop.  Pulmonary:     Effort: Pulmonary effort is normal. No respiratory distress.     Breath sounds: No stridor. No decreased breath sounds, wheezing, rhonchi or rales.  Chest:     Chest wall: No tenderness.     Comments: Port scar in the right upper chest is healing with mild swelling.  Abdominal:     General: Bowel sounds are normal. There is no  distension.     Palpations: Abdomen is soft. There is no hepatomegaly, splenomegaly or mass.     Tenderness: There is no abdominal tenderness. There is no right CVA tenderness, left CVA tenderness, guarding or rebound.     Hernia: No hernia is present.     Comments: Large midline scar in the upper abdomen Scars in the right upper quadrant ---- Large midline scar with firmness just inferior to it  Genitourinary:    Rectum: Guaiac result negative.  Musculoskeletal:        General: No swelling, tenderness, deformity or signs of injury. Normal range of motion.     Cervical back: Normal range of motion and neck supple. No rigidity or tenderness.     Right lower leg: No edema.     Left lower leg: No edema.  Lymphadenopathy:     Cervical: No cervical adenopathy.     Upper Body:     Right upper body: No supraclavicular or axillary adenopathy.     Left upper body: No supraclavicular or axillary adenopathy.     Lower Body: No right inguinal adenopathy. No left inguinal adenopathy.  Skin:    General: Skin is warm and dry.     Coloration: Skin is not jaundiced or pale.     Findings: No bruising, erythema, lesion or rash.  Neurological:     General: No focal deficit present.     Mental Status: He  is alert and oriented to person, place, and time. Mental status is at baseline.     Cranial Nerves: No cranial nerve deficit.     Sensory: No sensory deficit.     Motor: No weakness.     Coordination: Coordination normal.     Gait: Gait normal.     Deep Tendon Reflexes: Reflexes normal.  Psychiatric:        Mood and Affect: Mood normal.        Behavior: Behavior normal.        Thought Content: Thought content normal.        Judgment: Judgment normal.    LABS:      Latest Ref Rng & Units 02/14/2024    3:22 PM 06/14/2023    3:56 PM 10/11/2022    2:31 PM  CBC  WBC 4.0 - 10.5 K/uL 6.4  7.6  7.1   Hemoglobin 13.0 - 17.0 g/dL 84.1  83.7  84.4   Hematocrit 39.0 - 52.0 % 46.6  46.5  47.5    Platelets 150 - 400 K/uL 207  217  204       Latest Ref Rng & Units 02/14/2024    3:22 PM 06/14/2023    3:56 PM 10/11/2022    2:31 PM  CMP  Glucose 70 - 99 mg/dL 889  74  889   BUN 6 - 20 mg/dL 13  15  11    Creatinine 0.61 - 1.24 mg/dL 8.87  8.62  8.89   Sodium 135 - 145 mmol/L 138  138  136   Potassium 3.5 - 5.1 mmol/L 4.7  4.7  4.7   Chloride 98 - 111 mmol/L 102  101  101   CO2 22 - 32 mmol/L 27  27  26    Calcium  8.9 - 10.3 mg/dL 9.5  9.8  9.2   Total Protein 6.5 - 8.1 g/dL 7.2  7.0  7.2   Total Bilirubin 0.0 - 1.2 mg/dL 0.6  0.6  0.6   Alkaline Phos 38 - 126 U/L 103  94  70   AST 15 - 41 U/L 24  25  22    ALT 0 - 44 U/L 19  21  21     Lab Results  Component Value Date   CEA1 1.3 10/11/2022   CEA <1.00 02/14/2024   /  CEA  Date Value Ref Range Status  10/11/2022 1.3 0.0 - 4.7 ng/mL Final    Comment:    (NOTE)                             Nonsmokers          <3.9                             Smokers             <5.6 [employer] Electrochemiluminescence Immunoassay (ECLIA) Values obtained with different assay methods or kits cannot be used interchangeably.  Results cannot be interpreted as absolute evidence of the presence or absence of malignant disease. Performed At: [facility] [address] [place], [place] [id number] Jennette Shorter MD Ey:[id number]    CEA ([facility])  Date Value Ref Range Status  02/14/2024 <1.00 0.00 - 5.00 ng/mL Final    Comment:    (NOTE) This test was performed using [employer] Coul[employer]'s paramagnetic chemiluminescent immunoassay. Values obtained from different assay methods cannot be used  interchangeably. Please note that up to 8% of patients who smoke may see values 5.1-10.0 ng/ml and 1% of patients who smoke may see CEA levels >10.0 ng/ml. Performed at [facility], [address] [place], [place] [address]    No results found for: PSA1 No results found for: CAN199 No results found for: CAN125  No  results found for: TOTALPROTELP, ALBUMINELP, A1GS, A2GS, BETS, BETA2SER, GAMS, MSPIKE, SPEI Lab Results  Component Value Date   TIBC 225 (L) 11/22/2021   FERRITIN 292 11/22/2021   IRONPCTSAT 6 (L) 11/22/2021   No results found for: LDH  STUDIES:  EXAM: 10/22/2023 MRI ABDOMEN W WO CONTRAST IMPRESSION: --No evidence of metastatic disease within the abdomen.  --Redemonstrated changes of right hepatectomy and en bloc segment 6, 7, and 8 resection. No new concerning hepatic lesions.   EXAM: 06/14/2023 CT CHEST, ABDOMEN, AND PELVIS WITH CONTRAST IMPRESSION: 1. No acute findings.  No evidence of recurrent or metastatic disease. 2. Stable left pelvic kidney with large benign-appearing renal cyst (No followup imaging is recommended).  HISTORY:   Past Medical History:  Diagnosis Date   GERD (gastroesophageal reflux disease)    Renal lithiasis     Past Surgical History:  Procedure Laterality Date   LIVER RESECTION Right 07/02/2019   LIVER RESECTION Right 10/12/2021   SIGMOIDECTOMY  03/2016    Family History  Problem Relation Age of Onset   Lung cancer Father 57   Leukemia Father 5       acute myelogenous   Colon cancer Sister 109    Social History:  reports that he has quit smoking. He has never used smokeless tobacco. He reports that he does not currently use alcohol. No history on file for drug use.The patient is accompanied by his wife today.  Allergies: No Known Allergies  Current Medications: Current Outpatient Medications  Medication Sig Dispense Refill   ascorbic acid (VITAMIN C) 1000 MG tablet Take by mouth.     Calcium  Polycarbophil (FIBER) 625 MG TABS Take by mouth.     cetirizine (ZYRTEC) 10 MG tablet Take by mouth.     Cholecalciferol (VITAMIN D) 50 MCG (2000 UT) tablet Take by mouth.     Docusate Sodium (DSS) 100 MG CAPS Take 1 capsule by mouth daily.     Docusate Sodium (DSS) 100 MG CAPS Take by mouth.     Elderberry 500 MG CAPS Take 1  tablet by mouth daily.     hydrocortisone  (ANUSOL -HC) 25 MG suppository Place 1 suppository (25 mg total) rectally 2 (two) times daily. 12 suppository 1   lisinopril  (ZESTRIL ) 20 MG tablet Take 1 tablet (20 mg total) by mouth daily. 90 tablet 3   loperamide  (IMODIUM ) 2 MG capsule Take 1 capsule (2 mg total) by mouth as needed for diarrhea or loose stools. Take 2 at diarrhea onset , then 1 every 2hr until 12hrs with no BM. May take 2 every 4hrs at night. If diarrhea recurs repeat. 100 capsule 5   Misc Natural Products (ELDERBERRY IMMUNE COMPLEX) CHEW Chew 1 Dose by mouth daily.     Multiple Vitamin (MULTI-VITAMIN) tablet Take 1 tablet by mouth daily.     omeprazole (PRILOSEC) 20 MG capsule Take by mouth.     ondansetron  (ZOFRAN ) 8 MG tablet Take 1 tablet (8 mg total) by mouth 2 (two) times daily as needed for refractory nausea / vomiting. Start on day 3 after chemotherapy. 30 tablet 1   oxyCODONE (OXY IR/ROXICODONE) 5 MG immediate release tablet  Take by mouth.     sildenafil (VIAGRA) 100 MG tablet Take 1 tablet (100 mg total) by mouth daily as needed for erectile dysfunction. 10 tablet 0   vitamin B-12 (CYANOCOBALAMIN ) 250 MCG tablet Take by mouth.     zinc gluconate 50 MG tablet Take 50 mg by mouth daily.     No current facility-administered medications for this visit.   Facility-Administered Medications Ordered in Other Visits  Medication Dose Route Frequency Provider Last Rate Last Admin   atropine  1 MG/ML injection            palonosetron  (ALOXI ) 0.25 MG/5ML injection            I,Salathiel Ferrara H Herminia Warren,acting as a scribe for Eduardo VEAR Cornish, MD.,have documented all relevant documentation on the behalf of Eduardo VEAR Cornish, MD,as directed by  Eduardo VEAR Cornish, MD while in the presence of Eduardo VEAR Cornish, MD.  I have reviewed this report as typed by the medical scribe, and it is complete and accurate.

## 2024-02-26 ENCOUNTER — Encounter: Payer: Self-pay | Admitting: Oncology

## 2024-06-18 ENCOUNTER — Other Ambulatory Visit

## 2024-06-18 ENCOUNTER — Ambulatory Visit: Admitting: Oncology

## 2024-06-25 ENCOUNTER — Inpatient Hospital Stay

## 2024-06-25 ENCOUNTER — Inpatient Hospital Stay: Admitting: Oncology

## 2024-07-02 ENCOUNTER — Inpatient Hospital Stay: Admitting: Oncology

## 2024-07-02 ENCOUNTER — Inpatient Hospital Stay
